# Patient Record
Sex: Female | Born: 1990 | Race: White | Hispanic: Yes | Marital: Single | Smoking: Never smoker
Health system: Southern US, Community
[De-identification: ages and names within clinical notes are randomized; demographics above are authoritative.]

## PROBLEM LIST (undated history)

## (undated) ENCOUNTER — Inpatient Hospital Stay (HOSPITAL_COMMUNITY): Payer: Self-pay

## (undated) DIAGNOSIS — D571 Sickle-cell disease without crisis: Secondary | ICD-10-CM

## (undated) HISTORY — PX: TUBAL LIGATION: SHX77

---
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
## 2014-11-02 ENCOUNTER — Emergency Department (HOSPITAL_COMMUNITY)
Admission: EM | Admit: 2014-11-02 | Discharge: 2014-11-02 | Disposition: A | Discharge status: Home / Self Care | Payer: Self-pay | Attending: Emergency Medicine | Admitting: Emergency Medicine

## 2014-11-02 ENCOUNTER — Encounter (HOSPITAL_COMMUNITY): Payer: Self-pay | Admitting: *Deleted

## 2014-11-02 DIAGNOSIS — R110 Nausea: Secondary | ICD-10-CM | POA: Insufficient documentation

## 2014-11-02 DIAGNOSIS — D5700 Hb-SS disease with crisis, unspecified: Secondary | ICD-10-CM | POA: Insufficient documentation

## 2014-11-02 DIAGNOSIS — Z79899 Other long term (current) drug therapy: Secondary | ICD-10-CM | POA: Insufficient documentation

## 2014-11-02 HISTORY — DX: Sickle-cell disease without crisis: D57.1

## 2014-11-02 LAB — COMPREHENSIVE METABOLIC PANEL
ALT: 23 U/L (ref 0–35)
AST: 48 U/L — ABNORMAL HIGH (ref 0–37)
Albumin: 4.4 g/dL (ref 3.5–5.2)
Alkaline Phosphatase: 83 U/L (ref 39–117)
Anion gap: 6 (ref 5–15)
CHLORIDE: 108 mmol/L (ref 96–112)
CO2: 24 mmol/L (ref 19–32)
Calcium: 9.3 mg/dL (ref 8.4–10.5)
Creatinine, Ser: 0.4 mg/dL — ABNORMAL LOW (ref 0.50–1.10)
GFR calc non Af Amer: >90 mL/min (ref >90)
Glucose, Bld: 94 mg/dL (ref 70–99)
POTASSIUM: 4 mmol/L (ref 3.5–5.1)
SODIUM: 138 mmol/L (ref 135–145)
Total Bilirubin: 3.3 mg/dL — ABNORMAL HIGH (ref 0.3–1.2)
Total Protein: 7.7 g/dL (ref 6.0–8.3)

## 2014-11-02 LAB — CBC WITH DIFFERENTIAL/PLATELET
BASOS ABS: 0 10*3/uL (ref 0.0–0.1)
BLASTS: 0 %
Band Neutrophils: 0 % (ref 0–10)
Basophils Relative: 0 % (ref 0–1)
Eosinophils Absolute: 0.2 10*3/uL (ref 0.0–0.7)
Eosinophils Relative: 2 % (ref 0–5)
HCT: 23.9 % — ABNORMAL LOW (ref 36.0–46.0)
HEMOGLOBIN: 8.6 g/dL — AB (ref 12.0–15.0)
LYMPHS ABS: 3.7 10*3/uL (ref 0.7–4.0)
LYMPHS PCT: 41 % (ref 12–46)
MCH: 36.8 pg — ABNORMAL HIGH (ref 26.0–34.0)
MCHC: 36 g/dL (ref 30.0–36.0)
MCV: 102.1 fL — AB (ref 78.0–100.0)
Metamyelocytes Relative: 0 %
Monocytes Absolute: 0.7 10*3/uL (ref 0.1–1.0)
Monocytes Relative: 8 % (ref 3–12)
Myelocytes: 0 %
NEUTROS PCT: 49 % (ref 43–77)
NRBC: 0 /100{WBCs}
Neutro Abs: 4.5 10*3/uL (ref 1.7–7.7)
PLATELETS: 492 10*3/uL — AB (ref 150–400)
PROMYELOCYTES ABS: 0 %
RBC: 2.34 MIL/uL — ABNORMAL LOW (ref 3.87–5.11)
RDW: 23.9 % — ABNORMAL HIGH (ref 11.5–15.5)
WBC: 9.1 10*3/uL (ref 4.0–10.5)

## 2014-11-02 LAB — RETICULOCYTES
RBC.: 2.34 MIL/uL — ABNORMAL LOW (ref 3.87–5.11)
RETIC COUNT ABSOLUTE: 428.2 10*3/uL — AB (ref 19.0–186.0)
RETIC CT PCT: 18.3 % — AB (ref 0.4–3.1)

## 2014-11-02 MED ORDER — ONDANSETRON HCL 4 MG/2ML IJ SOLN
4.0000 mg | Freq: Once | INTRAMUSCULAR | Status: AC
  Administered 2014-11-02: 4 mg via INTRAVENOUS
  Filled 2014-11-02: qty 2

## 2014-11-02 MED ORDER — HYDROMORPHONE HCL 1 MG/ML IJ SOLN
1.0000 mg | Freq: Once | INTRAMUSCULAR | Status: AC
  Administered 2014-11-02: 1 mg via INTRAVENOUS
  Filled 2014-11-02: qty 1

## 2014-11-02 MED ORDER — DIPHENHYDRAMINE HCL 50 MG/ML IJ SOLN
12.5000 mg | Freq: Once | INTRAMUSCULAR | Status: AC
  Administered 2014-11-02: 12.5 mg via INTRAVENOUS
  Filled 2014-11-02: qty 1

## 2014-11-02 MED ORDER — HYDROMORPHONE HCL 1 MG/ML IJ SOLN
2.0000 mg | Freq: Once | INTRAMUSCULAR | Status: AC
  Administered 2014-11-02: 2 mg via INTRAVENOUS
  Filled 2014-11-02: qty 2

## 2014-11-02 NOTE — ED Provider Notes (Signed)
CSN: [id number]     Arrival date & time 11/02/14  0146 History   First MD Initiated Contact with Patient 11/02/14 0703     Chief Complaint  Patient presents with  . Sickle Cell Pain Crisis     (Consider location/radiation/quality/duration/timing/severity/associated sxs/prior Treatment) Patient is a 24 y.o. female presenting with sickle cell pain. The history is provided by the patient.  Sickle Cell Pain Crisis Location:  Back and lower extremity Severity:  Severe Onset quality:  Gradual Duration:  2 days Similar to previous crisis episodes: yes   Timing:  Constant Progression:  Waxing and waning Chronicity:  Recurrent Sickle cell genotype:  [place] Usual hemoglobin level:  Unknown Date of last transfusion:  Jan 2016 Frequency of attacks:  Every 3 weeks but having to come to the hospital every 2-3 months History of pulmonary emboli: no   Context: not alcohol consumption, not change in medication, not dehydration, not non-compliance and not pregnancy   Relieved by:  Prescription drugs Worsened by:  Activity and movement Ineffective treatments:  None tried (oxycodone with help some but the pain keeps coming back) Associated symptoms: nausea   Associated symptoms: no chest pain, no congestion, no cough, no fever, no headaches, no leg ulcers, no shortness of breath, no swelling of legs and no vomiting   Risk factors: frequent pain crises     Past Medical History  Diagnosis Date  . Sickle cell anemia    History reviewed. No pertinent past surgical history. No family history on file. History  Substance Use Topics  . Smoking status: Never Smoker   . Smokeless tobacco: Not on file  . Alcohol Use: No   OB History    No data available     Review of Systems  Constitutional: Negative for fever.  HENT: Negative for congestion.   Respiratory: Negative for cough and shortness of breath.   Cardiovascular: Negative for chest pain.  Gastrointestinal: Positive for nausea. Negative for  vomiting.  Neurological: Negative for headaches.  All other systems reviewed and are negative.     Allergies  Ceftin  Home Medications   Prior to Admission medications   Medication Sig Start Date End Date Taking? Authorizing Provider  acetaminophen (TYLENOL) 500 MG tablet Take 1,000 mg by mouth every 6 (six) hours as needed for mild pain.   Yes Historical Provider, MD  hydroxyurea (HYDREA) 500 MG capsule Take 1,000 mg by mouth daily. May take with food to minimize GI side effects.   Yes Historical Provider, MD  ibuprofen (ADVIL,MOTRIN) 200 MG tablet Take 600 mg by mouth every 6 (six) hours as needed for moderate pain.   Yes Historical Provider, MD  oxyCODONE (OXY IR/ROXICODONE) 5 MG immediate release tablet Take 5 mg by mouth every 4 (four) hours as needed for severe pain.   Yes Historical Provider, MD   BP 120/65 mmHg  Pulse 107  Temp(Src) 98.3 F (36.8 C) (Oral)  Resp 19  Ht 5\' 2"  (1.575 m)  Wt 114 lb (51.71 kg)  BMI 20.85 kg/m2  SpO2 96%  LMP 10/09/2014 Physical Exam  Constitutional: She is oriented to person, place, and time. She appears well-developed and well-nourished. She appears distressed.  HENT:  Head: Normocephalic and atraumatic.  Eyes: EOM are normal. Pupils are equal, round, and reactive to light.  Cardiovascular: Normal rate, regular rhythm, normal heart sounds and intact distal pulses.  Exam reveals no friction rub.   No murmur heard. Pulmonary/Chest: Effort normal and breath sounds normal. She has no wheezes.  She has no rales.  Abdominal: Soft. Bowel sounds are normal. She exhibits no distension. There is no tenderness. There is no rebound and no guarding.  Musculoskeletal: Normal range of motion. She exhibits tenderness.       Lumbar back: She exhibits tenderness. She exhibits no bony tenderness and no deformity.       Back:       Left upper leg: She exhibits tenderness. She exhibits no bony tenderness, no swelling and no edema.       Legs: No edema    Neurological: She is alert and oriented to person, place, and time. No cranial nerve deficit.  Skin: Skin is warm and dry. No rash noted.  Psychiatric: She has a normal mood and affect. Her behavior is normal.  Nursing note and vitals reviewed.   ED Course  Procedures (including critical care time) Labs Review Labs Reviewed  CBC WITH DIFFERENTIAL/PLATELET - Abnormal; Notable for the following:    RBC 2.34 (*)    Hemoglobin 8.6 (*)    HCT 23.9 (*)    MCV 102.1 (*)    MCH 36.8 (*)    RDW 23.9 (*)    Platelets 492 (*)    All other components within normal limits  COMPREHENSIVE METABOLIC PANEL - Abnormal; Notable for the following:    BUN <5 (*)    Creatinine, Ser 0.40 (*)    AST 48 (*)    Total Bilirubin 3.3 (*)    All other components within normal limits  RETICULOCYTES - Abnormal; Notable for the following:    Retic Ct Pct 18.3 (*)    RBC. 2.34 (*)    Retic Count, Manual 428.2 (*)    All other components within normal limits    Imaging Review No results found.   EKG Interpretation None      MDM   Final diagnoses:  Sickle cell pain crisis    Patient with a history of sickle cell disease who presents today complaining she is in a pain crisis with pain in her bilateral lower back and left thigh. Patient's last crisis was in January where she was seen at [facility] and received a blood transfusion. She has been taking oxycodone for her pain crisis for the last 2 days with some improvement and then pain returns. She denies fever, chest pain, abdominal pain, vomiting, dysuria. Last menstrual period was the end of February.  Low suspicion for acute chest today. No infectious symptoms. Patient has tenderness with palpation of her back and left thigh but no joint swelling or signs of cellulitis.  Labs show hemoglobin of 8.6, CMP that is normal and elevated reticulocyte count. Will treat patient's pain with Dilaudid and reevaluate in 3045 minutes to see if she has any  improvement.  8:36 AM Plan to reexamine patient. Her pain had some improvement with the first dose of Dilaudid. Will do a second and reassess.  9:53 AM Pt states pain is now down to 3/10 from 7/10.  Will give 1 more round and feel pt will most likely be able to go home.  10:48 AM Pt feeling better will d/c home.  Blanchie Dessert, MD 11/02/14 1048

## 2014-11-02 NOTE — ED Notes (Signed)
The pt is c/o back and leg pains for 2 days.  She is taking oxycodone but it is not helping.  lmp feb 24

## 2014-11-02 NOTE — ED Notes (Signed)
The pt lives in [place] and usually gets her care at [facility].  No previous records here.  No porta-cath or a hickman cath

## 2014-11-02 NOTE — Discharge Instructions (Signed)
Sickle Cell Anemia, Adult °Sickle cell anemia is a condition in which red blood cells have an abnormal "sickle" shape. This abnormal shape shortens the cells' life span, which results in a lower than normal concentration of red blood cells in the blood. The sickle shape also causes the cells to clump together and block free blood flow through the blood vessels. As a result, the tissues and organs of the body do not receive enough oxygen. Sickle cell anemia causes organ damage and pain and increases the risk of infection. °CAUSES  °Sickle cell anemia is a genetic disorder. Those who receive two copies of the gene have the condition, and those who receive one copy have the trait. °RISK FACTORS °The sickle cell gene is most common in people whose families originated in [place]. Other areas of the globe where sickle cell trait occurs include the Mediterranean, [place] and [place], the [place], and the [place].  °SIGNS AND SYMPTOMS °· Pain, especially in the extremities, back, chest, or abdomen (common). The pain may start suddenly or may develop following an illness, especially if there is dehydration. Pain can also occur due to overexertion or exposure to extreme temperature changes. °· Frequent severe bacterial infections, especially certain types of pneumonia and meningitis. °· Pain and swelling in the hands and feet. °· Decreased activity.   °· Loss of appetite.   °· Change in behavior. °· Headaches. °· Seizures. °· Shortness of breath or difficulty breathing. °· Vision changes. °· Skin ulcers. °Those with the trait may not have symptoms or they may have mild symptoms.  °DIAGNOSIS  °Sickle cell anemia is diagnosed with blood tests that demonstrate the genetic trait. It is often diagnosed during the newborn period, due to mandatory testing nationwide. A variety of blood tests, X-rays, CT scans, MRI scans, ultrasounds, and lung function tests may also be done to monitor the condition. °TREATMENT  °Sickle  cell anemia may be treated with: °· Medicines. You may be given pain medicines, antibiotic medicines (to treat and prevent infections) or medicines to increase the production of certain types of hemoglobin. °· Fluids. °· Oxygen. °· Blood transfusions. °HOME CARE INSTRUCTIONS  °· Drink enough fluid to keep your urine clear or pale yellow. Increase your fluid intake in hot weather and during exercise. °· Do not smoke. Smoking lowers oxygen levels in the blood.   °· Only take over-the-counter or prescription medicines for pain, fever, or discomfort as directed by your health care provider. °· Take antibiotics as directed by your health care provider. Make sure you finish them it even if you start to feel better.   °· Take supplements as directed by your health care provider.   °· Consider wearing a medical alert bracelet. This tells anyone caring for you in an emergency of your condition.   °· When traveling, keep your medical information, health care provider's names, and the medicines you take with you at all times.   °· If you develop a fever, do not take medicines to reduce the fever right away. This could cover up a problem that is developing. Notify your health care provider. °· Keep all follow-up appointments with your health care provider. Sickle cell anemia requires regular medical care. °SEEK MEDICAL CARE IF: ° You have a fever. °SEEK IMMEDIATE MEDICAL CARE IF:  °· You feel dizzy or faint.   °· You have new abdominal pain, especially on the left side near the stomach area.   °· You develop a persistent, often uncomfortable and painful penile erection (priapism). If this is not treated immediately it   will lead to impotence.   °· You have numbness your arms or legs or you have a hard time moving them.   °· You have a hard time with speech.   °· You have a fever or persistent symptoms for more than 2-3 days.   °· You have a fever and your symptoms suddenly get worse.   °· You have signs or symptoms of infection.  These include:   °¨ Chills.   °¨ Abnormal tiredness (lethargy).   °¨ Irritability.   °¨ Poor eating.   °¨ Vomiting.   °· You develop pain that is not helped with medicine.   °· You develop shortness of breath. °· You have pain in your chest.   °· You are coughing up pus-like or bloody sputum.   °· You develop a stiff neck. °· Your feet or hands swell or have pain. °· Your abdomen appears bloated. °· You develop joint pain. °MAKE SURE YOU: °· Understand these instructions. °Document Released: 11/10/2005 Document Revised: 12/17/2013 Document Reviewed: 03/14/2013 °ExitCare® Patient Information ©2015 [employer]. This information is not intended to replace advice given to you by your health care provider. Make sure you discuss any questions you have with your health care provider. ° °

## 2015-01-07 ENCOUNTER — Encounter (HOSPITAL_COMMUNITY): Payer: Self-pay | Admitting: Emergency Medicine

## 2015-01-07 ENCOUNTER — Emergency Department (HOSPITAL_COMMUNITY)
Admission: EM | Admit: 2015-01-07 | Discharge: 2015-01-08 | Disposition: A | Discharge status: Home / Self Care | Payer: Self-pay | Attending: Emergency Medicine | Admitting: Emergency Medicine

## 2015-01-07 DIAGNOSIS — M545 Low back pain: Secondary | ICD-10-CM | POA: Insufficient documentation

## 2015-01-07 DIAGNOSIS — O9989 Other specified diseases and conditions complicating pregnancy, childbirth and the puerperium: Secondary | ICD-10-CM | POA: Insufficient documentation

## 2015-01-07 DIAGNOSIS — M79605 Pain in left leg: Secondary | ICD-10-CM | POA: Insufficient documentation

## 2015-01-07 DIAGNOSIS — M79602 Pain in left arm: Secondary | ICD-10-CM | POA: Insufficient documentation

## 2015-01-07 DIAGNOSIS — Z3490 Encounter for supervision of normal pregnancy, unspecified, unspecified trimester: Secondary | ICD-10-CM

## 2015-01-07 DIAGNOSIS — O99011 Anemia complicating pregnancy, first trimester: Secondary | ICD-10-CM | POA: Insufficient documentation

## 2015-01-07 DIAGNOSIS — R590 Localized enlarged lymph nodes: Secondary | ICD-10-CM | POA: Insufficient documentation

## 2015-01-07 DIAGNOSIS — Z79899 Other long term (current) drug therapy: Secondary | ICD-10-CM | POA: Insufficient documentation

## 2015-01-07 DIAGNOSIS — Z3A01 Less than 8 weeks gestation of pregnancy: Secondary | ICD-10-CM | POA: Insufficient documentation

## 2015-01-07 DIAGNOSIS — D5700 Hb-SS disease with crisis, unspecified: Secondary | ICD-10-CM | POA: Insufficient documentation

## 2015-01-07 LAB — CBC WITH DIFFERENTIAL/PLATELET
BASOS PCT: 1 % (ref 0–1)
Basophils Absolute: 0.1 10*3/uL (ref 0.0–0.1)
Eosinophils Absolute: 0 10*3/uL (ref 0.0–0.7)
Eosinophils Relative: 1 % (ref 0–5)
HEMATOCRIT: 26 % — AB (ref 36.0–46.0)
Hemoglobin: 9.3 g/dL — ABNORMAL LOW (ref 12.0–15.0)
LYMPHS ABS: 3.3 10*3/uL (ref 0.7–4.0)
Lymphocytes Relative: 45 % (ref 12–46)
MCH: 38.6 pg — ABNORMAL HIGH (ref 26.0–34.0)
MCHC: 35.8 g/dL (ref 30.0–36.0)
MCV: 107.9 fL — ABNORMAL HIGH (ref 78.0–100.0)
MONO ABS: 0.9 10*3/uL (ref 0.1–1.0)
Monocytes Relative: 12 % (ref 3–12)
Neutro Abs: 3.2 10*3/uL (ref 1.7–7.7)
Neutrophils Relative %: 43 % (ref 43–77)
Platelets: 464 10*3/uL — ABNORMAL HIGH (ref 150–400)
RBC: 2.41 MIL/uL — ABNORMAL LOW (ref 3.87–5.11)
RDW: 16.3 % — ABNORMAL HIGH (ref 11.5–15.5)
WBC: 7.5 10*3/uL (ref 4.0–10.5)

## 2015-01-07 LAB — RETICULOCYTES
RBC.: 2.41 MIL/uL — ABNORMAL LOW (ref 3.87–5.11)
Retic Count, Absolute: 173.5 10*3/uL (ref 19.0–186.0)
Retic Ct Pct: 7.2 % — ABNORMAL HIGH (ref 0.4–3.1)

## 2015-01-07 LAB — POC URINE PREG, ED: PREG TEST UR: POSITIVE — AB

## 2015-01-07 NOTE — ED Notes (Signed)
Pt. reports sickle cell pain crisis onset this afternoon with chest pain , legs pain and lower back pain . Denies fever or chills. Respirations unlabored.

## 2015-01-08 ENCOUNTER — Emergency Department (HOSPITAL_COMMUNITY): Payer: Self-pay

## 2015-01-08 LAB — I-STAT BETA HCG BLOOD, ED (MC, WL, AP ONLY)

## 2015-01-08 LAB — URINALYSIS, ROUTINE W REFLEX MICROSCOPIC
BILIRUBIN URINE: NEGATIVE
Glucose, UA: NEGATIVE mg/dL
Hgb urine dipstick: NEGATIVE
Ketones, ur: NEGATIVE mg/dL
LEUKOCYTES UA: NEGATIVE
Nitrite: NEGATIVE
Protein, ur: NEGATIVE mg/dL
Specific Gravity, Urine: 1.007 (ref 1.005–1.030)
Urobilinogen, UA: 0.2 mg/dL (ref 0.0–1.0)
pH: 7 (ref 5.0–8.0)

## 2015-01-08 LAB — COMPREHENSIVE METABOLIC PANEL
ALK PHOS: 67 U/L (ref 38–126)
ALT: 22 U/L (ref 14–54)
ANION GAP: 6 (ref 5–15)
AST: 38 U/L (ref 15–41)
Albumin: 4.5 g/dL (ref 3.5–5.0)
BUN: 8 mg/dL (ref 6–20)
CALCIUM: 9.6 mg/dL (ref 8.9–10.3)
CO2: 24 mmol/L (ref 22–32)
Chloride: 107 mmol/L (ref 101–111)
Creatinine, Ser: 0.39 mg/dL — ABNORMAL LOW (ref 0.44–1.00)
GFR calc Af Amer: >60 mL/min (ref >60)
GFR calc non Af Amer: >60 mL/min (ref >60)
GLUCOSE: 94 mg/dL (ref 65–99)
Potassium: 4.1 mmol/L (ref 3.5–5.1)
SODIUM: 137 mmol/L (ref 135–145)
Total Bilirubin: 3 mg/dL — ABNORMAL HIGH (ref 0.3–1.2)
Total Protein: 8.1 g/dL (ref 6.5–8.1)

## 2015-01-08 LAB — HCG, QUANTITATIVE, PREGNANCY: hCG, Beta Chain, Quant, S: 5893 m[IU]/mL — ABNORMAL HIGH (ref <5)

## 2015-01-08 LAB — ABO/RH: ABO/RH(D): O POS

## 2015-01-08 MED ORDER — HYDROMORPHONE HCL 1 MG/ML IJ SOLN
1.0000 mg | Freq: Once | INTRAMUSCULAR | Status: AC
  Administered 2015-01-08: 1 mg via INTRAVENOUS
  Filled 2015-01-08: qty 1

## 2015-01-08 MED ORDER — SODIUM CHLORIDE 0.9 % IV BOLUS (SEPSIS)
1000.0000 mL | Freq: Once | INTRAVENOUS | Status: AC
  Administered 2015-01-08: 1000 mL via INTRAVENOUS

## 2015-01-08 NOTE — ED Provider Notes (Signed)
CSN: [id number]     Arrival date & time 01/07/15  2304 History  This chart was scribed for Debby Freiberg, MD by Molli Posey, ED Scribe. This patient was seen in room B19C/B19C and the patient's care was started 12:27 AM.   Chief Complaint  Patient presents with  . Sickle Cell Pain Crisis   Patient is a 24 y.o. female presenting with sickle cell pain. The history is provided by the patient. No language interpreter was used.  Sickle Cell Pain Crisis Location:  Lower extremity and back Severity:  Moderate Duration:  7 hours Similar to previous crisis episodes: yes   Timing:  Constant Relieved by:  None tried Associated symptoms: fever   Associated symptoms: no congestion, no cough, no nausea, no sore throat and no vomiting    HPI Comments: Bernis Stecher is a 24 y.o. female who presents to the Emergency Department complaining of sickle cell pain that started yesterday at [facility]. She reports she has pain in her left leg, left arm and lower back. Pt states that she had a fever earlier with a temperature of 101 around 8PM (which is frequent with her sickle cell crises). Pt reports she has been experiencing some mild SOB but states she is not experiencing SOB now.  She states that she occasionally has fevers with sickle cell pain episodes. Pt reports that she normally takes oxycodone for her sickle cell pain but states she has not taken any due to her pregnancy. She states she has taken no medications PTA.  She denies nausea, vomiting, cough, congestion, leg swelling, sore throat or dysuria.   Pt reports she had vaginal discharge and spotting 2 days ago. She states that she took 2 pregnancy tests that both tested positive that day. She states that she has an appointment in 3 days.    Past Medical History  Diagnosis Date  . Sickle cell anemia    History reviewed. No pertinent past surgical history. No family history on file. History  Substance Use Topics  . Smoking status: Never Smoker   .  Smokeless tobacco: Not on file  . Alcohol Use: No   OB History    No data available     Review of Systems  Constitutional: Positive for fever.  HENT: Negative for congestion and sore throat.   Respiratory: Negative for cough.   Cardiovascular: Negative for leg swelling.  Gastrointestinal: Negative for nausea and vomiting.  Genitourinary: Negative for dysuria.  All other systems reviewed and are negative.  Allergies  Ceftin  Home Medications   Prior to Admission medications   Medication Sig Start Date End Date Taking? Authorizing Provider  acetaminophen (TYLENOL) 500 MG tablet Take 1,000 mg by mouth every 6 (six) hours as needed for mild pain.   Yes Historical Provider, MD  hydroxyurea (HYDREA) 500 MG capsule Take 1,000 mg by mouth daily. May take with food to minimize GI side effects.   Yes Historical Provider, MD  ibuprofen (ADVIL,MOTRIN) 200 MG tablet Take 600 mg by mouth every 6 (six) hours as needed for moderate pain.   Yes Historical Provider, MD   BP 90/38 mmHg  Pulse 85  Temp(Src) 98.5 F (36.9 C) (Oral)  Resp 26  SpO2 98%  LMP 12/04/2014 Physical Exam  Constitutional: She is oriented to person, place, and time. She appears well-developed and well-nourished.  HENT:  Head: Normocephalic and atraumatic.  Right Ear: External ear normal.  Left Ear: External ear normal.  Eyes: Conjunctivae and EOM are normal. Pupils are  equal, round, and reactive to light.  Neck: Normal range of motion. Neck supple.  Cardiovascular: Normal rate, regular rhythm, normal heart sounds and intact distal pulses.   Pulmonary/Chest: Effort normal and breath sounds normal.  Abdominal: Soft. Bowel sounds are normal. There is no tenderness.  Musculoskeletal: Normal range of motion.  Neurological: She is alert and oriented to person, place, and time.  Skin: Skin is warm and dry.  Vitals reviewed.   ED Course  Procedures   DIAGNOSTIC STUDIES: Oxygen Saturation is 99% on RA, normal by my  interpretation.    COORDINATION OF CARE: 7:31 AM Discussed treatment plan with pt at bedside and pt agreed to plan.   Labs Review Labs Reviewed  CBC WITH DIFFERENTIAL/PLATELET - Abnormal; Notable for the following:    RBC 2.41 (*)    Hemoglobin 9.3 (*)    HCT 26.0 (*)    MCV 107.9 (*)    MCH 38.6 (*)    RDW 16.3 (*)    Platelets 464 (*)    All other components within normal limits  COMPREHENSIVE METABOLIC PANEL - Abnormal; Notable for the following:    Creatinine, Ser 0.39 (*)    Total Bilirubin 3.0 (*)    All other components within normal limits  RETICULOCYTES - Abnormal; Notable for the following:    Retic Ct Pct 7.2 (*)    RBC. 2.41 (*)    All other components within normal limits  HCG, QUANTITATIVE, PREGNANCY - Abnormal; Notable for the following:    hCG, Beta Chain, Quant, S 5893 (*)    All other components within normal limits  POC URINE PREG, ED - Abnormal; Notable for the following:    Preg Test, Ur POSITIVE (*)    All other components within normal limits  I-STAT BETA HCG BLOOD, ED ([facility], [facility], [facility] ONLY) - Abnormal; Notable for the following:    I-stat hCG, quantitative >2000.0 (*)    All other components within normal limits  URINALYSIS, ROUTINE W REFLEX MICROSCOPIC  ABO/RH    Imaging Review US Ob Comp Less 14 Wks  01/08/2015   CLINICAL DATA:  Sickle cell pain crisis. Gestational age by last menstrual period 5 weeks and 0 days. Pelvic cramping.  EXAM: OBSTETRIC <14 WK [place] AND TRANSVAGINAL OB US  TECHNIQUE: Both transabdominal and transvaginal ultrasound examinations were performed for complete evaluation of the gestation as well as the maternal uterus, adnexal regions, and pelvic cul-de-sac. Transvaginal technique was performed to assess early pregnancy.  COMPARISON:  None.  FINDINGS: Intrauterine gestational sac: Visualized/normal in shape.  Yolk sac:  Present  Embryo:  Not present  Cardiac Activity: Not present  MSD: 7.1  mm   5 w   3  d  Maternal uterus/adnexae: No  subchorionic hemorrhage. Normal appearance of the adnexae, with 2.7 cm RIGHT corpus luteal cyst. No free fluid.  IMPRESSION: Early intrauterine gestational and yolk sac without fetal pole, or cardiac activity yet visualized. Recommend follow-up quantitative B-HCG levels and follow-up US in 14 days to confirm and assess viability. This recommendation follows SRU consensus guidelines: Diagnostic Criteria for Nonviable Pregnancy Early in the First Trimester. [place] Med 2013; 465:0354-65.   Electronically Signed   By: Elon Alas M.D.   On: 01/08/2015 06:48   US Ob Transvaginal  01/08/2015   CLINICAL DATA:  Sickle cell pain crisis. Gestational age by last menstrual period 5 weeks and 0 days. Pelvic cramping.  EXAM: OBSTETRIC <14 WK [place] AND TRANSVAGINAL OB US  TECHNIQUE: Both  transabdominal and transvaginal ultrasound examinations were performed for complete evaluation of the gestation as well as the maternal uterus, adnexal regions, and pelvic cul-de-sac. Transvaginal technique was performed to assess early pregnancy.  COMPARISON:  None.  FINDINGS: Intrauterine gestational sac: Visualized/normal in shape.  Yolk sac:  Present  Embryo:  Not present  Cardiac Activity: Not present  MSD: 7.1  mm   5 w   3  d  Maternal uterus/adnexae: No subchorionic hemorrhage. Normal appearance of the adnexae, with 2.7 cm RIGHT corpus luteal cyst. No free fluid.  IMPRESSION: Early intrauterine gestational and yolk sac without fetal pole, or cardiac activity yet visualized. Recommend follow-up quantitative B-HCG levels and follow-up US in 14 days to confirm and assess viability. This recommendation follows SRU consensus guidelines: Diagnostic Criteria for Nonviable Pregnancy Early in the First Trimester. [employer] Med 2013; 975:8832-54.   Electronically Signed   By: Elon Alas M.D.   On: 01/08/2015 06:48     EKG Interpretation None     EMERGENCY DEPARTMENT [place] PREGNANCY "Study: Limited Ultrasound of the Pelvis for  Pregnancy"  INDICATIONS:Pregnancy(required) and Vaginal bleeding Multiple views of the uterus and pelvic cavity were obtained in real-time with a multi-frequency probe.  APPROACH:Transabdominal   PERFORMED BY: Myself  IMAGES ARCHIVED?: Yes  LIMITATIONS: Emergent procedure  PREGNANCY FREE FLUID: None  ADNEXAL FINDINGS: none  PREGNANCY FINDINGS: Intrauterine fluid collection, no obvious yolk sac  INTERPRETATION: Indeterminate study  GESTATIONAL AGE, ESTIMATE:   FETAL HEART RATE:   CPT Codes:  98264-15 (transabdominal OB)  768-26-52 (transvaginal OB, Reduced level of service for incomplete exam)     MDM   Final diagnoses:  Intrauterine pregnancy  Sickle cell pain crisis    24 y.o. female with pertinent PMH of sickle cell presents with recurrent sickle cell pain, concern for pregnancy.  She denies chest pain or dyspnea.  Physical exam benign on arrival.  Bedside US demonstrated IU fluid collection without yolk sac.  HCG >5000, confirmatory formal US ordered.   [place] as above confirmed IUP.  O+.  Symptoms relieved with 1 round dilaudid.  DC home in stable condition.  I have reviewed all laboratory and imaging studies if ordered as above  1. Intrauterine pregnancy   2. Sickle cell crisis   3. Sickle cell pain crisis           Debby Freiberg, MD 01/08/15 [phone]

## 2015-01-08 NOTE — Discharge Instructions (Signed)
First Trimester of Pregnancy The first trimester of pregnancy is from week 1 until the end of week 12 (months 1 through 3). A week after a sperm fertilizes an egg, the egg will implant on the wall of the uterus. This embryo will begin to develop into a baby. Genes from you and your partner are forming the baby. The female genes determine whether the baby is a boy or a girl. At 6-8 weeks, the eyes and face are formed, and the heartbeat can be seen on ultrasound. At the end of 12 weeks, all the baby's organs are formed.  Now that you are pregnant, you will want to do everything you can to have a healthy baby. Two of the most important things are to get good prenatal care and to follow your health care provider's instructions. Prenatal care is all the medical care you receive before the baby's birth. This care will help prevent, find, and treat any problems during the pregnancy and childbirth. BODY CHANGES Your body goes through many changes during pregnancy. The changes vary from woman to woman.   You may gain or lose a couple of pounds at first.  You may feel sick to your stomach (nauseous) and throw up (vomit). If the vomiting is uncontrollable, call your health care provider.  You may tire easily.  You may develop headaches that can be relieved by medicines approved by your health care provider.  You may urinate more often. Painful urination may mean you have a bladder infection.  You may develop heartburn as a result of your pregnancy.  You may develop constipation because certain hormones are causing the muscles that push waste through your intestines to slow down.  You may develop hemorrhoids or swollen, bulging veins (varicose veins).  Your breasts may begin to grow larger and become tender. Your nipples may stick out more, and the tissue that surrounds them (areola) may become darker.  Your gums may bleed and may be sensitive to brushing and flossing.  Dark spots or blotches (chloasma,  mask of pregnancy) may develop on your face. This will likely fade after the baby is born.  Your menstrual periods will stop.  You may have a loss of appetite.  You may develop cravings for certain kinds of food.  You may have changes in your emotions from day to day, such as being excited to be pregnant or being concerned that something may go wrong with the pregnancy and baby.  You may have more vivid and strange dreams.  You may have changes in your hair. These can include thickening of your hair, rapid growth, and changes in texture. Some women also have hair loss during or after pregnancy, or hair that feels dry or thin. Your hair will most likely return to normal after your baby is born. WHAT TO EXPECT AT YOUR PRENATAL VISITS During a routine prenatal visit:  You will be weighed to make sure you and the baby are growing normally.  Your blood pressure will be taken.  Your abdomen will be measured to track your baby's growth.  The fetal heartbeat will be listened to starting around week 10 or 12 of your pregnancy.  Test results from any previous visits will be discussed. Your health care provider may ask you:  How you are feeling.  If you are feeling the baby move.  If you have had any abnormal symptoms, such as leaking fluid, bleeding, severe headaches, or abdominal cramping.  If you have any questions. Other tests  that may be performed during your first trimester include: °· Blood tests to find your blood type and to check for the presence of any previous infections. They will also be used to check for low iron levels (anemia) and Rh antibodies. Later in the pregnancy, blood tests for diabetes will be done along with other tests if problems develop. °· Urine tests to check for infections, diabetes, or protein in the urine. °· An ultrasound to confirm the proper growth and development of the baby. °· An amniocentesis to check for possible genetic problems. °· Fetal screens for  spina bifida and Down syndrome. °· You may need other tests to make sure you and the baby are doing well. °HOME CARE INSTRUCTIONS  °Medicines °· Follow your health care provider's instructions regarding medicine use. Specific medicines may be either safe or unsafe to take during pregnancy. °· Take your prenatal vitamins as directed. °· If you develop constipation, try taking a stool softener if your health care provider approves. °Diet °· Eat regular, well-balanced meals. Choose a variety of foods, such as meat or vegetable-based protein, fish, milk and low-fat dairy products, vegetables, fruits, and whole grain breads and cereals. Your health care provider will help you determine the amount of weight gain that is right for you. °· Avoid raw meat and uncooked cheese. These carry germs that can cause birth defects in the baby. °· Eating four or five small meals rather than three large meals a day may help relieve nausea and vomiting. If you start to feel nauseous, eating a few soda crackers can be helpful. Drinking liquids between meals instead of during meals also seems to help nausea and vomiting. °· If you develop constipation, eat more high-fiber foods, such as fresh vegetables or fruit and whole grains. Drink enough fluids to keep your urine clear or pale yellow. °Activity and Exercise °· Exercise only as directed by your health care provider. Exercising will help you: °¨ Control your weight. °¨ Stay in shape. °¨ Be prepared for labor and delivery. °· Experiencing pain or cramping in the lower abdomen or low back is a good sign that you should stop exercising. Check with your health care provider before continuing normal exercises. °· Try to avoid standing for long periods of time. Move your legs often if you must stand in one place for a long time. °· Avoid heavy lifting. °· Wear low-heeled shoes, and practice good posture. °· You may continue to have sex unless your health care provider directs you  otherwise. °Relief of Pain or Discomfort °· Wear a good support bra for breast tenderness.   °· Take warm sitz baths to soothe any pain or discomfort caused by hemorrhoids. Use hemorrhoid cream if your health care provider approves.   °· Rest with your legs elevated if you have leg cramps or low back pain. °· If you develop varicose veins in your legs, wear support hose. Elevate your feet for 15 minutes, 3-4 times a day. Limit salt in your diet. °Prenatal Care °· Schedule your prenatal visits by the twelfth week of pregnancy. They are usually scheduled monthly at first, then more often in the last 2 months before delivery. °· Write down your questions. Take them to your prenatal visits. °· Keep all your prenatal visits as directed by your health care provider. °Safety °· Wear your seat belt at all times when driving. °· Make a list of emergency phone numbers, including numbers for family, friends, the hospital, and police and fire departments. °General Tips °·   Ask your health care provider for a referral to a local prenatal education class. Begin classes no later than at the beginning of month 6 of your pregnancy.  Ask for help if you have counseling or nutritional needs during pregnancy. Your health care provider can offer advice or refer you to specialists for help with various needs.  Do not use hot tubs, steam rooms, or saunas.  Do not douche or use tampons or scented sanitary pads.  Do not cross your legs for long periods of time.  Avoid cat litter boxes and soil used by cats. These carry germs that can cause birth defects in the baby and possibly loss of the fetus by miscarriage or stillbirth.  Avoid all smoking, herbs, alcohol, and medicines not prescribed by your health care provider. Chemicals in these affect the formation and growth of the baby.  Schedule a dentist appointment. At home, brush your teeth with a soft toothbrush and be gentle when you floss. SEEK MEDICAL CARE IF:   You have  dizziness.  You have mild pelvic cramps, pelvic pressure, or nagging pain in the abdominal area.  You have persistent nausea, vomiting, or diarrhea.  You have a bad smelling vaginal discharge.  You have pain with urination.  You notice increased swelling in your face, hands, legs, or ankles. SEEK IMMEDIATE MEDICAL CARE IF:   You have a fever.  You are leaking fluid from your vagina.  You have spotting or bleeding from your vagina.  You have severe abdominal cramping or pain.  You have rapid weight gain or loss.  You vomit blood or material that looks like coffee grounds.  You are exposed to [place] measles and have never had them.  You are exposed to fifth disease or chickenpox.  You develop a severe headache.  You have shortness of breath.  You have any kind of trauma, such as from a fall or a car accident. Document Released: 07/27/2001 Document Revised: 12/17/2013 Document Reviewed: 06/12/2013 [facility] Patient Information 2015 [place], [place]. This information is not intended to replace advice given to you by your health care provider. Make sure you discuss any questions you have with your health care provider. Sickle Cell Anemia, Adult Sickle cell anemia is a condition in which red blood cells have an abnormal "sickle" shape. This abnormal shape shortens the cells' life span, which results in a lower than normal concentration of red blood cells in the blood. The sickle shape also causes the cells to clump together and block free blood flow through the blood vessels. As a result, the tissues and organs of the body do not receive enough oxygen. Sickle cell anemia causes organ damage and pain and increases the risk of infection. CAUSES  Sickle cell anemia is a genetic disorder. Those who receive two copies of the gene have the condition, and those who receive one copy have the trait. RISK FACTORS The sickle cell gene is most common in people whose families originated in  [place]. Other areas of the globe where sickle cell trait occurs include the [place], [place], and the [place].  SIGNS AND SYMPTOMS  Pain, especially in the extremities, back, chest, or abdomen (common). The pain may start suddenly or may develop following an illness, especially if there is dehydration. Pain can also occur due to overexertion or exposure to extreme temperature changes.  Frequent severe bacterial infections, especially certain types of pneumonia and meningitis.  Pain and swelling in the hands and feet.  Decreased activity.   Loss of appetite.   Change in behavior.  Headaches.  Seizures.  Shortness of breath or difficulty breathing.  Vision changes.  Skin ulcers. Those with the trait may not have symptoms or they may have mild symptoms.  DIAGNOSIS  Sickle cell anemia is diagnosed with blood tests that demonstrate the genetic trait. It is often diagnosed during the newborn period, due to mandatory testing nationwide. A variety of blood tests, X-rays, CT scans, MRI scans, ultrasounds, and lung function tests may also be done to monitor the condition. TREATMENT  Sickle cell anemia may be treated with:  Medicines. You may be given pain medicines, antibiotic medicines (to treat and prevent infections) or medicines to increase the production of certain types of hemoglobin.  Fluids.  Oxygen.  Blood transfusions. HOME CARE INSTRUCTIONS   Drink enough fluid to keep your urine clear or pale yellow. Increase your fluid intake in hot weather and during exercise.  Do not smoke. Smoking lowers oxygen levels in the blood.   Only take over-the-counter or prescription medicines for pain, fever, or discomfort as directed by your health care provider.  Take antibiotics as directed by your health care provider. Make sure you finish them it even if you start to feel better.   Take supplements as directed by your health care  provider.   Consider wearing a medical alert bracelet. This tells anyone caring for you in an emergency of your condition.   When traveling, keep your medical information, health care provider's names, and the medicines you take with you at all times.   If you develop a fever, do not take medicines to reduce the fever right away. This could cover up a problem that is developing. Notify your health care provider.  Keep all follow-up appointments with your health care provider. Sickle cell anemia requires regular medical care. SEEK MEDICAL CARE IF: You have a fever. SEEK IMMEDIATE MEDICAL CARE IF:   You feel dizzy or faint.   You have new abdominal pain, especially on the left side near the stomach area.   You develop a persistent, often uncomfortable and painful penile erection (priapism). If this is not treated immediately it will lead to impotence.   You have numbness your arms or legs or you have a hard time moving them.   You have a hard time with speech.   You have a fever or persistent symptoms for more than 2-3 days.   You have a fever and your symptoms suddenly get worse.   You have signs or symptoms of infection. These include:   Chills.   Abnormal tiredness (lethargy).   Irritability.   Poor eating.   Vomiting.   You develop pain that is not helped with medicine.   You develop shortness of breath.  You have pain in your chest.   You are coughing up pus-like or bloody sputum.   You develop a stiff neck.  Your feet or hands swell or have pain.  Your abdomen appears bloated.  You develop joint pain. MAKE SURE YOU:  Understand these instructions. Document Released: 11/10/2005 Document Revised: 12/17/2013 Document Reviewed: 03/14/2013 [facility] Patient Information 2015 [place], [place]. This information is not intended to replace advice given to you by your health care provider. Make sure you discuss any questions you have with your  health care provider.

## 2015-01-08 NOTE — ED Notes (Signed)
Home address: [address] [place], [place].

## 2015-01-08 NOTE — ED Notes (Signed)
Patient allowed to eat per Dr. Colin Rhein. Relayed to patient. Currently awaiting hcg result.

## 2015-01-08 NOTE — ED Notes (Signed)
Attempted to call ultrasound called, no response.

## 2015-01-08 NOTE — ED Notes (Signed)
2 missed iv attempts by this RN. 2nd RN at the bedside for access.

## 2015-01-08 NOTE — ED Notes (Signed)
Spoke to A/C, Crystal, in regards to transportation home. Discussed with patient need to call a friend for a ride if possible, she acknowledges. No one to call with a car. Crystal approves taxi voucher prior to discharge. Patient is currently in ultrasound.

## 2015-02-04 ENCOUNTER — Encounter: Payer: Self-pay | Admitting: Obstetrics & Gynecology

## 2015-02-04 ENCOUNTER — Ambulatory Visit (INDEPENDENT_AMBULATORY_CARE_PROVIDER_SITE_OTHER): Payer: Self-pay | Admitting: Obstetrics & Gynecology

## 2015-02-04 VITALS — BP 123/75 | HR 97 | Temp 98.9°F | Wt 113.8 lb

## 2015-02-04 DIAGNOSIS — D571 Sickle-cell disease without crisis: Secondary | ICD-10-CM

## 2015-02-04 DIAGNOSIS — Z3491 Encounter for supervision of normal pregnancy, unspecified, first trimester: Secondary | ICD-10-CM

## 2015-02-04 DIAGNOSIS — O0990 Supervision of high risk pregnancy, unspecified, unspecified trimester: Secondary | ICD-10-CM

## 2015-02-04 DIAGNOSIS — O99019 Anemia complicating pregnancy, unspecified trimester: Secondary | ICD-10-CM

## 2015-02-04 LAB — POCT URINALYSIS DIP (DEVICE)
Glucose, UA: NEGATIVE mg/dL
KETONES UR: NEGATIVE mg/dL
LEUKOCYTES UA: NEGATIVE
Nitrite: NEGATIVE
Protein, ur: NEGATIVE mg/dL
Specific Gravity, Urine: 1.015 (ref 1.005–1.030)
UROBILINOGEN UA: 0.2 mg/dL (ref 0.0–1.0)
pH: 6 (ref 5.0–8.0)

## 2015-02-04 NOTE — Progress Notes (Signed)
  Subjective:    Gabriella Griffin is being seen today for her first obstetrical visit.  This is not a planned pregnancy. She is at 105w2d gestation. Her obstetrical history is significant for Sickle cell anemia. Relationship with FOB: significant other, living together. Patient does intend to breast feed. Pregnancy history fully reviewed. Followed at [facility] Dr. Clenton Pare. [place] in [place]. Had recent sickle cell crises.  Has a crises q 2-3 months.  Pt reports being hospitalized ~4-5 times last year fro crises.  Crises is usually in left leg and back.   Patient reports no complaints.  Review of Systems:   Review of Systems  Objective:     BP 123/75 mmHg  Pulse 97  Temp(Src) 98.9 F (37.2 C)  Wt 113 lb 12.8 oz (51.619 kg)  LMP 12/04/2014 Physical Exam  Exam  Pt in NAD HEENT: WNL Lungs: CTA CV: RRR Abd; soft, NT, ND GU: EGBUS: no lesions Vagina: no blood in vault Cervix: no lesion; no mucopurulent d/c Uterus: ~10 weeks sized Adnexa: no masses; non tender  Ext: no edema; FROM       Assessment:    Pregnancy: G1P0 Sickle cell anemia in pregnancy      01/09/2015 CLINICAL DATA: Sickle cell pain crisis. Gestational age by last menstrual period 5 weeks and 0 days. Pelvic cramping.  EXAM: OBSTETRIC <14 WK [place] AND TRANSVAGINAL OB US  TECHNIQUE: Both transabdominal and transvaginal ultrasound examinations were performed for complete evaluation of the gestation as well as the maternal uterus, adnexal regions, and pelvic cul-de-sac. Transvaginal technique was performed to assess early pregnancy.  COMPARISON: None.  FINDINGS: Intrauterine gestational sac: Visualized/normal in shape.  Yolk sac: Present  Embryo: Not present  Cardiac Activity: Not present  MSD: 7.1 mm 5 w 3 d  Maternal uterus/adnexae: No subchorionic hemorrhage. Normal appearance of the adnexae, with 2.7 cm RIGHT corpus luteal cyst. No free fluid.  IMPRESSION: Early  intrauterine gestational and yolk sac without fetal pole, or cardiac activity yet visualized. Recommend follow-up quantitative B-HCG levels and follow-up US in 14 days to confirm and assess viability. This recommendation follows SRU consensus guidelines: Diagnostic Criteria for Nonviable Pregnancy Early in the First Trimester. [employer] Med 2013; 388:8280-03. Plan:     Initial labs drawn. Prenatal vitamins. Problem list reviewed and updated. AFP3 discussed: requested. 1st trimester genetic screen requested Role of ultrasound in pregnancy discussed; fetal survey: requested. Amniocentesis discussed: not indicated. Follow up in 4 weeks. 60% of 40 min visit spent on counseling and coordination of care.   Partner needs screening for sickle cell anemia.  He was referred for testing.  Appt made for this thur, June 23.     HARRAWAY-SMITH, Orville Mena 02/04/2015

## 2015-02-04 NOTE — Progress Notes (Signed)
Scheduled FOB at [facility], [address] [phone] for Thursday, June 23rd @ [phone]

## 2015-02-04 NOTE — Patient Instructions (Signed)
First Trimester of Pregnancy The first trimester of pregnancy is from week 1 until the end of week 12 (months 1 through 3). A week after a sperm fertilizes an egg, the egg will implant on the wall of the uterus. This embryo will begin to develop into a baby. Genes from you and your partner are forming the baby. The female genes determine whether the baby is a boy or a girl. At 6-8 weeks, the eyes and face are formed, and the heartbeat can be seen on ultrasound. At the end of 12 weeks, all the baby's organs are formed.  Now that you are pregnant, you will want to do everything you can to have a healthy baby. Two of the most important things are to get good prenatal care and to follow your health care provider's instructions. Prenatal care is all the medical care you receive before the baby's birth. This care will help prevent, find, and treat any problems during the pregnancy and childbirth. BODY CHANGES Your body goes through many changes during pregnancy. The changes vary from woman to woman.   You may gain or lose a couple of pounds at first.  You may feel sick to your stomach (nauseous) and throw up (vomit). If the vomiting is uncontrollable, call your health care provider.  You may tire easily.  You may develop headaches that can be relieved by medicines approved by your health care provider.  You may urinate more often. Painful urination may mean you have a bladder infection.  You may develop heartburn as a result of your pregnancy.  You may develop constipation because certain hormones are causing the muscles that push waste through your intestines to slow down.  You may develop hemorrhoids or swollen, bulging veins (varicose veins).  Your breasts may begin to grow larger and become tender. Your nipples may stick out more, and the tissue that surrounds them (areola) may become darker.  Your gums may bleed and may be sensitive to brushing and flossing.  Dark spots or blotches (chloasma,  mask of pregnancy) may develop on your face. This will likely fade after the baby is born.  Your menstrual periods will stop.  You may have a loss of appetite.  You may develop cravings for certain kinds of food.  You may have changes in your emotions from day to day, such as being excited to be pregnant or being concerned that something may go wrong with the pregnancy and baby.  You may have more vivid and strange dreams.  You may have changes in your hair. These can include thickening of your hair, rapid growth, and changes in texture. Some women also have hair loss during or after pregnancy, or hair that feels dry or thin. Your hair will most likely return to normal after your baby is born. WHAT TO EXPECT AT YOUR PRENATAL VISITS During a routine prenatal visit:  You will be weighed to make sure you and the baby are growing normally.  Your blood pressure will be taken.  Your abdomen will be measured to track your baby's growth.  The fetal heartbeat will be listened to starting around week 10 or 12 of your pregnancy.  Test results from any previous visits will be discussed. Your health care provider may ask you:  How you are feeling.  If you are feeling the baby move.  If you have had any abnormal symptoms, such as leaking fluid, bleeding, severe headaches, or abdominal cramping.  If you have any questions. Other tests   that may be performed during your first trimester include:  Blood tests to find your blood type and to check for the presence of any previous infections. They will also be used to check for low iron levels (anemia) and Rh antibodies. Later in the pregnancy, blood tests for diabetes will be done along with other tests if problems develop.  Urine tests to check for infections, diabetes, or protein in the urine.  An ultrasound to confirm the proper growth and development of the baby.  An amniocentesis to check for possible genetic problems.  Fetal screens for  spina bifida and Down syndrome.  You may need other tests to make sure you and the baby are doing well. HOME CARE INSTRUCTIONS  Medicines  Follow your health care provider's instructions regarding medicine use. Specific medicines may be either safe or unsafe to take during pregnancy.  Take your prenatal vitamins as directed.  If you develop constipation, try taking a stool softener if your health care provider approves. Diet  Eat regular, well-balanced meals. Choose a variety of foods, such as meat or vegetable-based protein, fish, milk and low-fat dairy products, vegetables, fruits, and whole grain breads and cereals. Your health care provider will help you determine the amount of weight gain that is right for you.  Avoid raw meat and uncooked cheese. These carry germs that can cause birth defects in the baby.  Eating four or five small meals rather than three large meals a day may help relieve nausea and vomiting. If you start to feel nauseous, eating a few soda crackers can be helpful. Drinking liquids between meals instead of during meals also seems to help nausea and vomiting.  If you develop constipation, eat more high-fiber foods, such as fresh vegetables or fruit and whole grains. Drink enough fluids to keep your urine clear or pale yellow. Activity and Exercise  Exercise only as directed by your health care provider. Exercising will help you:  Control your weight.  Stay in shape.  Be prepared for labor and delivery.  Experiencing pain or cramping in the lower abdomen or low back is a good sign that you should stop exercising. Check with your health care provider before continuing normal exercises.  Try to avoid standing for long periods of time. Move your legs often if you must stand in one place for a long time.  Avoid heavy lifting.  Wear low-heeled shoes, and practice good posture.  You may continue to have sex unless your health care provider directs you  otherwise. Relief of Pain or Discomfort  Wear a good support bra for breast tenderness.   Take warm sitz baths to soothe any pain or discomfort caused by hemorrhoids. Use hemorrhoid cream if your health care provider approves.   Rest with your legs elevated if you have leg cramps or low back pain.  If you develop varicose veins in your legs, wear support hose. Elevate your feet for 15 minutes, 3-4 times a day. Limit salt in your diet. Prenatal Care  Schedule your prenatal visits by the twelfth week of pregnancy. They are usually scheduled monthly at first, then more often in the last 2 months before delivery.  Write down your questions. Take them to your prenatal visits.  Keep all your prenatal visits as directed by your health care provider. Safety  Wear your seat belt at all times when driving.  Make a list of emergency phone numbers, including numbers for family, friends, the hospital, and police and fire departments. General Tips    Ask your health care provider for a referral to a local prenatal education class. Begin classes no later than at the beginning of month 6 of your pregnancy.  Ask for help if you have counseling or nutritional needs during pregnancy. Your health care provider can offer advice or refer you to specialists for help with various needs.  Do not use hot tubs, steam rooms, or saunas.  Do not douche or use tampons or scented sanitary pads.  Do not cross your legs for long periods of time.  Avoid cat litter boxes and soil used by cats. These carry germs that can cause birth defects in the baby and possibly loss of the fetus by miscarriage or stillbirth.  Avoid all smoking, herbs, alcohol, and medicines not prescribed by your health care provider. Chemicals in these affect the formation and growth of the baby.  Schedule a dentist appointment. At home, brush your teeth with a soft toothbrush and be gentle when you floss. SEEK MEDICAL CARE IF:   You have  dizziness.  You have mild pelvic cramps, pelvic pressure, or nagging pain in the abdominal area.  You have persistent nausea, vomiting, or diarrhea.  You have a bad smelling vaginal discharge.  You have pain with urination.  You notice increased swelling in your face, hands, legs, or ankles. SEEK IMMEDIATE MEDICAL CARE IF:   You have a fever.  You are leaking fluid from your vagina.  You have spotting or bleeding from your vagina.  You have severe abdominal cramping or pain.  You have rapid weight gain or loss.  You vomit blood or material that looks like coffee grounds.  You are exposed to German measles and have never had them.  You are exposed to fifth disease or chickenpox.  You develop a severe headache.  You have shortness of breath.  You have any kind of trauma, such as from a fall or a car accident. Document Released: 07/27/2001 Document Revised: 12/17/2013 Document Reviewed: 06/12/2013 Exit[employer] Patient Information 2015 [employer]. This information is not intended to replace advice given to you by your health care provider. Make sure you discuss any questions you have with your health care provider.  

## 2015-02-04 NOTE — Progress Notes (Signed)
Initial OB appointment New OB Educational Packet New OB labs Hgb: Trace, Bilirubin: Small Breastfeeding tip of the week reviewed

## 2015-02-05 LAB — PRESCRIPTION MONITORING PROFILE (19 PANEL)
Amphetamine/Meth: NEGATIVE ng/mL
Barbiturate Screen, Urine: NEGATIVE ng/mL
Benzodiazepine Screen, Urine: NEGATIVE ng/mL
Buprenorphine, Urine: NEGATIVE ng/mL
CANNABINOID SCRN UR: NEGATIVE ng/mL
COCAINE METABOLITES: NEGATIVE ng/mL
Carisoprodol, Urine: NEGATIVE ng/mL
Creatinine, Urine: 97.32 mg/dL (ref >20.0)
FENTANYL URINE: NEGATIVE ng/mL
MDMA URINE: NEGATIVE ng/mL
MEPERIDINE UR: NEGATIVE ng/mL
METHAQUALONE SCREEN (URINE): NEGATIVE ng/mL
Methadone Screen, Urine: NEGATIVE ng/mL
NITRITES URINE, INITIAL: NEGATIVE ug/mL
OPIATE SCREEN, URINE: NEGATIVE ng/mL
Oxycodone Screen, Ur: NEGATIVE ng/mL
Phencyclidine, Ur: NEGATIVE ng/mL
Propoxyphene: NEGATIVE ng/mL
TRAMADOL UR: NEGATIVE ng/mL
Tapentadol, urine: NEGATIVE ng/mL
ZOLPIDEM, URINE: NEGATIVE ng/mL
pH, Initial: 6 pH (ref 4.5–8.9)

## 2015-02-05 LAB — PRENATAL PROFILE (SOLSTAS)
Antibody Screen: NEGATIVE
BASOS ABS: 0 10*3/uL (ref 0.0–0.1)
Basophils Relative: 0 % (ref 0–1)
Eosinophils Absolute: 0 10*3/uL (ref 0.0–0.7)
Eosinophils Relative: 0 % (ref 0–5)
HCT: 25.9 % — ABNORMAL LOW (ref 36.0–46.0)
HIV: NONREACTIVE
Hemoglobin: 9 g/dL — ABNORMAL LOW (ref 12.0–15.0)
Hepatitis B Surface Ag: NEGATIVE
LYMPHS PCT: 15 % (ref 12–46)
Lymphs Abs: 2 10*3/uL (ref 0.7–4.0)
MCH: 37.5 pg — ABNORMAL HIGH (ref 26.0–34.0)
MCHC: 34.7 g/dL (ref 30.0–36.0)
MCV: 107.9 fL — ABNORMAL HIGH (ref 78.0–100.0)
MPV: 10.3 fL (ref 8.6–12.4)
Monocytes Absolute: 1.2 10*3/uL — ABNORMAL HIGH (ref 0.1–1.0)
Monocytes Relative: 9 % (ref 3–12)
NEUTROS ABS: 9.9 10*3/uL — AB (ref 1.7–7.7)
Neutrophils Relative %: 76 % (ref 43–77)
PLATELETS: 483 10*3/uL — AB (ref 150–400)
RBC: 2.4 MIL/uL — AB (ref 3.87–5.11)
RDW: 16.1 % — AB (ref 11.5–15.5)
Rh Type: POSITIVE
Rubella: 2.56 Index — ABNORMAL HIGH (ref <0.90)
WBC: 13 10*3/uL — ABNORMAL HIGH (ref 4.0–10.5)

## 2015-02-05 LAB — CYTOLOGY - PAP

## 2015-02-05 NOTE — Progress Notes (Signed)
1st trimester genetic screen ordered at [facility]

## 2015-02-06 ENCOUNTER — Encounter (HOSPITAL_COMMUNITY): Payer: Self-pay | Admitting: Emergency Medicine

## 2015-02-06 ENCOUNTER — Inpatient Hospital Stay (HOSPITAL_COMMUNITY)
Admission: EM | Admit: 2015-02-06 | Discharge: 2015-02-09 | DRG: 781 | Disposition: A | Discharge status: Home / Self Care | Payer: Medicaid Other | Attending: Internal Medicine | Admitting: Internal Medicine

## 2015-02-06 ENCOUNTER — Emergency Department (HOSPITAL_COMMUNITY): Payer: Medicaid Other

## 2015-02-06 DIAGNOSIS — O99011 Anemia complicating pregnancy, first trimester: Principal | ICD-10-CM | POA: Diagnosis present

## 2015-02-06 DIAGNOSIS — Z833 Family history of diabetes mellitus: Secondary | ICD-10-CM

## 2015-02-06 DIAGNOSIS — D571 Sickle-cell disease without crisis: Secondary | ICD-10-CM | POA: Diagnosis present

## 2015-02-06 DIAGNOSIS — D5700 Hb-SS disease with crisis, unspecified: Secondary | ICD-10-CM | POA: Diagnosis present

## 2015-02-06 DIAGNOSIS — Z3A09 9 weeks gestation of pregnancy: Secondary | ICD-10-CM | POA: Diagnosis present

## 2015-02-06 DIAGNOSIS — Z832 Family history of diseases of the blood and blood-forming organs and certain disorders involving the immune mechanism: Secondary | ICD-10-CM

## 2015-02-06 DIAGNOSIS — J209 Acute bronchitis, unspecified: Secondary | ICD-10-CM | POA: Diagnosis present

## 2015-02-06 DIAGNOSIS — J029 Acute pharyngitis, unspecified: Secondary | ICD-10-CM | POA: Diagnosis present

## 2015-02-06 LAB — CBC WITH DIFFERENTIAL/PLATELET
Basophils Absolute: 0.1 10*3/uL (ref 0.0–0.1)
Basophils Relative: 0 % (ref 0–1)
EOS PCT: 1 % (ref 0–5)
Eosinophils Absolute: 0.1 10*3/uL (ref 0.0–0.7)
HCT: 26.2 % — ABNORMAL LOW (ref 36.0–46.0)
Hemoglobin: 9.7 g/dL — ABNORMAL LOW (ref 12.0–15.0)
LYMPHS ABS: 2.3 10*3/uL (ref 0.7–4.0)
LYMPHS PCT: 20 % (ref 12–46)
MCH: 38.3 pg — ABNORMAL HIGH (ref 26.0–34.0)
MCHC: 37 g/dL — ABNORMAL HIGH (ref 30.0–36.0)
MCV: 103.6 fL — AB (ref 78.0–100.0)
MONO ABS: 1.1 10*3/uL — AB (ref 0.1–1.0)
Monocytes Relative: 9 % (ref 3–12)
Neutro Abs: 8.3 10*3/uL — ABNORMAL HIGH (ref 1.7–7.7)
Neutrophils Relative %: 70 % (ref 43–77)
PLATELETS: 481 10*3/uL — AB (ref 150–400)
RBC: 2.53 MIL/uL — ABNORMAL LOW (ref 3.87–5.11)
RDW: 15 % (ref 11.5–15.5)
WBC: 11.8 10*3/uL — AB (ref 4.0–10.5)

## 2015-02-06 LAB — COMPREHENSIVE METABOLIC PANEL
ALBUMIN: 3.9 g/dL (ref 3.5–5.0)
ALK PHOS: 63 U/L (ref 38–126)
ALT: 22 U/L (ref 14–54)
AST: 45 U/L — ABNORMAL HIGH (ref 15–41)
Anion gap: 7 (ref 5–15)
BUN: <5 mg/dL — ABNORMAL LOW (ref 6–20)
CO2: 23 mmol/L (ref 22–32)
Calcium: 9.2 mg/dL (ref 8.9–10.3)
Chloride: 107 mmol/L (ref 101–111)
Creatinine, Ser: 0.34 mg/dL — ABNORMAL LOW (ref 0.44–1.00)
GFR calc Af Amer: >60 mL/min (ref >60)
GFR calc non Af Amer: >60 mL/min (ref >60)
Glucose, Bld: 103 mg/dL — ABNORMAL HIGH (ref 65–99)
POTASSIUM: 3.7 mmol/L (ref 3.5–5.1)
SODIUM: 137 mmol/L (ref 135–145)
TOTAL PROTEIN: 7.6 g/dL (ref 6.5–8.1)
Total Bilirubin: 2.3 mg/dL — ABNORMAL HIGH (ref 0.3–1.2)

## 2015-02-06 LAB — HEMOGLOBINOPATHY EVALUATION
HEMOGLOBIN OTHER: 0 %
HGB A2 QUANT: 2.3 % (ref 2.2–3.2)
Hgb A: 0 % — ABNORMAL LOW (ref 96.8–97.8)
Hgb F Quant: 27.8 % — ABNORMAL HIGH (ref 0.0–2.0)
Hgb S Quant: 69.9 % — ABNORMAL HIGH

## 2015-02-06 LAB — CULTURE, OB URINE

## 2015-02-06 LAB — RETICULOCYTES
RBC.: 2.53 MIL/uL — AB (ref 3.87–5.11)
RETIC COUNT ABSOLUTE: 301.1 10*3/uL — AB (ref 19.0–186.0)
RETIC CT PCT: 11.9 % — AB (ref 0.4–3.1)

## 2015-02-06 LAB — PROTIME-INR
INR: 1.17 (ref 0.00–1.49)
PROTHROMBIN TIME: 15.1 s (ref 11.6–15.2)

## 2015-02-06 MED ORDER — SODIUM CHLORIDE 0.9 % IV SOLN
1000.0000 mL | INTRAVENOUS | Status: DC
  Administered 2015-02-06 – 2015-02-08 (×7): 1000 mL via INTRAVENOUS

## 2015-02-06 MED ORDER — SODIUM CHLORIDE 0.9 % IV SOLN
1000.0000 mL | Freq: Once | INTRAVENOUS | Status: AC
  Administered 2015-02-06: 1000 mL via INTRAVENOUS

## 2015-02-06 MED ORDER — ONDANSETRON HCL 4 MG/2ML IJ SOLN
4.0000 mg | INTRAMUSCULAR | Status: DC | PRN
  Administered 2015-02-06: 4 mg via INTRAVENOUS
  Filled 2015-02-06: qty 2

## 2015-02-06 MED ORDER — HYDROMORPHONE HCL 1 MG/ML IJ SOLN
1.0000 mg | Freq: Once | INTRAMUSCULAR | Status: AC
  Administered 2015-02-06: 1 mg via INTRAVENOUS
  Filled 2015-02-06: qty 1

## 2015-02-06 MED ORDER — HYDROMORPHONE HCL 1 MG/ML IJ SOLN
1.0000 mg | INTRAMUSCULAR | Status: AC | PRN
Start: 2015-02-06 — End: 2015-02-06
  Administered 2015-02-06 (×2): 1 mg via INTRAVENOUS
  Filled 2015-02-06 (×2): qty 1

## 2015-02-06 MED ORDER — DIPHENHYDRAMINE HCL 50 MG/ML IJ SOLN
12.5000 mg | INTRAMUSCULAR | Status: DC | PRN
  Administered 2015-02-06: 12.5 mg via INTRAVENOUS
  Filled 2015-02-06: qty 1

## 2015-02-06 NOTE — ED Notes (Signed)
Patient called a second time and is now in ED waiting room.

## 2015-02-06 NOTE — ED Notes (Signed)
Dr.Knapp at bedside  

## 2015-02-06 NOTE — ED Notes (Signed)
Called patient multiple times to transport patient in room no answer.

## 2015-02-06 NOTE — ED Notes (Signed)
Pt states that when she has pain medication she is unable to urinate and that she has had to have an in and out cath before so that she can pee. She states that sometimes it gets so bad she feels "like im going to burst"

## 2015-02-06 NOTE — ED Notes (Signed)
Pt c/o left side pain in shoulder and hip that feels like sickle cell pain x 3 days

## 2015-02-06 NOTE — ED Notes (Signed)
Pt is [redacted] weeks pregnant

## 2015-02-06 NOTE — ED Provider Notes (Signed)
CSN: [id number]     Arrival date & time 02/06/15  1629 History   First MD Initiated Contact with Patient 02/06/15 1944     Chief Complaint  Patient presents with  . Sickle Cell Pain Crisis    Patient is a 24 y.o. female presenting with sickle cell pain.  Sickle Cell Pain Crisis Pain location: Involving her left leg and shoulder and her back. Severity:  Moderate Onset quality:  Gradual Duration:  3 days Timing:  Constant Progression:  Worsening Context: not change in medication, not dehydration and not non-compliance   Relieved by:  Nothing Worsened by:  Nothing tried Ineffective treatments:  OTC medications Associated symptoms: no chest pain, no cough, no fever and no shortness of breath    patient is also 9 weeks' pregnant. She denies any trouble with vaginal bleeding or discharge. No abdominal pain nausea or vomiting.  Past Medical History  Diagnosis Date  . Sickle cell anemia    Past Surgical History  Procedure Laterality Date  . Cholecystectomy  2007   Family History  Problem Relation Age of Onset  . Sickle cell anemia Sister   . Diabetes Paternal Grandmother    History  Substance Use Topics  . Smoking status: Never Smoker   . Smokeless tobacco: Not on file  . Alcohol Use: No   OB History    Gravida Para Term Preterm AB TAB SAB Ectopic Multiple Living   1              Review of Systems  Constitutional: Negative for fever.  Respiratory: Negative for cough and shortness of breath.   Cardiovascular: Negative for chest pain.  All other systems reviewed and are negative.     Allergies  Ceftin  Home Medications   Prior to Admission medications   Medication Sig Start Date End Date Taking? Authorizing Provider  acetaminophen (TYLENOL) 500 MG tablet Take 1,000 mg by mouth every 6 (six) hours as needed for mild pain.   Yes Historical Provider, MD  Prenatal Vit-Fe Fumarate-FA (MULTIVITAMIN-PRENATAL) 27-0.8 MG TABS tablet Take 1 tablet by mouth daily at 12 noon.    Yes Historical Provider, MD   BP 93/52 mmHg  Pulse 81  Temp(Src) 98.4 F (36.9 C) (Oral)  Resp 20  SpO2 99%  LMP 12/04/2014 Physical Exam  Constitutional: She appears well-developed and well-nourished. No distress.  HENT:  Head: Normocephalic and atraumatic.  Right Ear: External ear normal.  Left Ear: External ear normal.  Eyes: Conjunctivae are normal. Right eye exhibits no discharge. Left eye exhibits no discharge. No scleral icterus.  Neck: Neck supple. No tracheal deviation present.  Cardiovascular: Normal rate, regular rhythm and intact distal pulses.   Pulmonary/Chest: Effort normal and breath sounds normal. No stridor. No respiratory distress. She has no wheezes. She has no rales.  Abdominal: Soft. Bowel sounds are normal. She exhibits no distension. There is no tenderness. There is no rebound and no guarding.  Musculoskeletal: She exhibits no edema or tenderness.  Neurological: She is alert. She has normal strength. No cranial nerve deficit (no facial droop, extraocular movements intact, no slurred speech) or sensory deficit. She exhibits normal muscle tone. She displays no seizure activity. Coordination normal.  Skin: Skin is warm and dry. No rash noted.  Psychiatric: She has a normal mood and affect.  Nursing note and vitals reviewed.   ED Course  Procedures (including critical care time) Labs Review Labs Reviewed  CBC WITH DIFFERENTIAL/PLATELET - Abnormal; Notable for the following:  WBC 11.8 (*)    RBC 2.53 (*)    Hemoglobin 9.7 (*)    HCT 26.2 (*)    MCV 103.6 (*)    MCH 38.3 (*)    MCHC 37.0 (*)    Platelets 481 (*)    Neutro Abs 8.3 (*)    Monocytes Absolute 1.1 (*)    All other components within normal limits  COMPREHENSIVE METABOLIC PANEL - Abnormal; Notable for the following:    Glucose, Bld 103 (*)    BUN <5 (*)    Creatinine, Ser 0.34 (*)    AST 45 (*)    Total Bilirubin 2.3 (*)    All other components within normal limits  RETICULOCYTES -  Abnormal; Notable for the following:    Retic Ct Pct 11.9 (*)    RBC. 2.53 (*)    Retic Count, Manual 301.1 (*)    All other components within normal limits  PROTIME-INR  URINALYSIS, ROUTINE W REFLEX MICROSCOPIC (NOT AT [facility])    Imaging Review Dg Chest 2 View  02/06/2015   CLINICAL DATA:  Cough and congestion for 4 days.  EXAM: CHEST  2 VIEW  COMPARISON:  None.  FINDINGS: The heart size and mediastinal contours are within normal limits. Both lungs are clear. No pneumothorax or pleural effusion is noted. The visualized skeletal structures are unremarkable.  IMPRESSION: No active cardiopulmonary disease.   Electronically Signed   By: Marijo Conception, M.D.   On: 02/06/2015 21:09    Medications  0.9 %  sodium chloride infusion (0 mLs Intravenous Stopped 02/06/15 2251)    Followed by  0.9 %  sodium chloride infusion (0 mLs Intravenous Stopped 02/06/15 2251)    Followed by  0.9 %  sodium chloride infusion (1,000 mLs Intravenous New Bag/Given 02/06/15 2034)  diphenhydrAMINE (BENADRYL) injection 12.5 mg (12.5 mg Intravenous Given 02/06/15 2035)  ondansetron (ZOFRAN) injection 4 mg (4 mg Intravenous Given 02/06/15 2035)  HYDROmorphone (DILAUDID) injection 1 mg (1 mg Intravenous Given 02/06/15 2143)  HYDROmorphone (DILAUDID) injection 1 mg (1 mg Intravenous Given 02/06/15 2252)     MDM   Final diagnoses:  Sickle cell crisis    Pt is having persistent pain.  Not improving after meds.  Plan on admission for further treatment.    Dorie Rank, MD 02/06/15 [phone]

## 2015-02-07 ENCOUNTER — Encounter (HOSPITAL_COMMUNITY): Payer: Self-pay | Admitting: *Deleted

## 2015-02-07 DIAGNOSIS — J209 Acute bronchitis, unspecified: Secondary | ICD-10-CM | POA: Diagnosis present

## 2015-02-07 DIAGNOSIS — D5700 Hb-SS disease with crisis, unspecified: Secondary | ICD-10-CM | POA: Diagnosis present

## 2015-02-07 DIAGNOSIS — O99011 Anemia complicating pregnancy, first trimester: Secondary | ICD-10-CM | POA: Diagnosis not present

## 2015-02-07 DIAGNOSIS — Z833 Family history of diabetes mellitus: Secondary | ICD-10-CM | POA: Diagnosis not present

## 2015-02-07 DIAGNOSIS — J029 Acute pharyngitis, unspecified: Secondary | ICD-10-CM | POA: Diagnosis present

## 2015-02-07 DIAGNOSIS — Z3A09 9 weeks gestation of pregnancy: Secondary | ICD-10-CM | POA: Diagnosis not present

## 2015-02-07 DIAGNOSIS — D571 Sickle-cell disease without crisis: Secondary | ICD-10-CM

## 2015-02-07 DIAGNOSIS — Z832 Family history of diseases of the blood and blood-forming organs and certain disorders involving the immune mechanism: Secondary | ICD-10-CM | POA: Diagnosis not present

## 2015-02-07 LAB — URINALYSIS, ROUTINE W REFLEX MICROSCOPIC
Bilirubin Urine: NEGATIVE
Glucose, UA: NEGATIVE mg/dL
Hgb urine dipstick: NEGATIVE
KETONES UR: NEGATIVE mg/dL
Leukocytes, UA: NEGATIVE
Nitrite: NEGATIVE
PH: 6.5 (ref 5.0–8.0)
PROTEIN: NEGATIVE mg/dL
Specific Gravity, Urine: 1.009 (ref 1.005–1.030)
Urobilinogen, UA: 0.2 mg/dL (ref 0.0–1.0)

## 2015-02-07 LAB — COMPREHENSIVE METABOLIC PANEL
ALBUMIN: 3.5 g/dL (ref 3.5–5.0)
ALT: 17 U/L (ref 14–54)
ANION GAP: 6 (ref 5–15)
AST: 33 U/L (ref 15–41)
Alkaline Phosphatase: 54 U/L (ref 38–126)
CALCIUM: 8.5 mg/dL — AB (ref 8.9–10.3)
CHLORIDE: 109 mmol/L (ref 101–111)
CO2: 23 mmol/L (ref 22–32)
Glucose, Bld: 100 mg/dL — ABNORMAL HIGH (ref 65–99)
POTASSIUM: 3.5 mmol/L (ref 3.5–5.1)
Sodium: 138 mmol/L (ref 135–145)
TOTAL PROTEIN: 6.7 g/dL (ref 6.5–8.1)
Total Bilirubin: 1.4 mg/dL — ABNORMAL HIGH (ref 0.3–1.2)

## 2015-02-07 LAB — RAPID URINE DRUG SCREEN, HOSP PERFORMED
AMPHETAMINES: NOT DETECTED
BARBITURATES: NOT DETECTED
Benzodiazepines: NOT DETECTED
Cocaine: NOT DETECTED
OPIATES: POSITIVE — AB
TETRAHYDROCANNABINOL: NOT DETECTED

## 2015-02-07 LAB — INFLUENZA PANEL BY PCR (TYPE A & B)
H1N1 flu by pcr: NOT DETECTED
INFLAPCR: NEGATIVE
Influenza B By PCR: NEGATIVE

## 2015-02-07 LAB — CBC
HEMATOCRIT: 22.5 % — AB (ref 36.0–46.0)
HEMOGLOBIN: 7.9 g/dL — AB (ref 12.0–15.0)
MCH: 36.6 pg — ABNORMAL HIGH (ref 26.0–34.0)
MCHC: 35.1 g/dL (ref 30.0–36.0)
MCV: 104.2 fL — AB (ref 78.0–100.0)
Platelets: 431 10*3/uL — ABNORMAL HIGH (ref 150–400)
RBC: 2.16 MIL/uL — ABNORMAL LOW (ref 3.87–5.11)
RDW: 15.3 % (ref 11.5–15.5)
WBC: 12.5 10*3/uL — AB (ref 4.0–10.5)

## 2015-02-07 LAB — APTT: aPTT: 34 seconds (ref 24–37)

## 2015-02-07 MED ORDER — SODIUM CHLORIDE 0.9 % IJ SOLN: 9.0000 mL | INTRAMUSCULAR | Status: DC | PRN

## 2015-02-07 MED ORDER — HYDROMORPHONE 0.3 MG/ML IV SOLN
INTRAVENOUS | Status: DC
  Administered 2015-02-07 (×2): via INTRAVENOUS
  Administered 2015-02-07: 3.5 mg via INTRAVENOUS
  Administered 2015-02-08: 0.3 mg via INTRAVENOUS
  Administered 2015-02-08: 2.1 mg via INTRAVENOUS
  Administered 2015-02-08: 3 mg via INTRAVENOUS
  Administered 2015-02-08: 1.8 mg via INTRAVENOUS
  Administered 2015-02-08: 3.6 mg via INTRAVENOUS
  Administered 2015-02-09: 02:00:00 via INTRAVENOUS
  Administered 2015-02-09: 0.6 mg via INTRAVENOUS
  Administered 2015-02-09: 0.9 mg via INTRAVENOUS
  Filled 2015-02-07 (×4): qty 25

## 2015-02-07 MED ORDER — ACETAMINOPHEN 325 MG PO TABS: 650.0000 mg | ORAL_TABLET | Freq: Four times a day (QID) | ORAL | Status: DC | PRN

## 2015-02-07 MED ORDER — DIPHENHYDRAMINE HCL 12.5 MG/5ML PO ELIX: 12.5000 mg | ORAL_SOLUTION | Freq: Four times a day (QID) | ORAL | Status: DC | PRN

## 2015-02-07 MED ORDER — PRENATAL 27-0.8 MG PO TABS
1.0000 | ORAL_TABLET | Freq: Every day | ORAL | Status: DC
  Administered 2015-02-07 – 2015-02-09 (×3): 1 via ORAL
  Filled 2015-02-07 (×3): qty 1

## 2015-02-07 MED ORDER — HYDROMORPHONE HCL 1 MG/ML IJ SOLN: 1.0000 mg | INTRAMUSCULAR | Status: DC | PRN

## 2015-02-07 MED ORDER — ONDANSETRON HCL 4 MG/2ML IJ SOLN
4.0000 mg | Freq: Four times a day (QID) | INTRAMUSCULAR | Status: DC | PRN
  Administered 2015-02-07 (×2): 4 mg via INTRAVENOUS
  Filled 2015-02-07 (×2): qty 2

## 2015-02-07 MED ORDER — NALOXONE HCL 0.4 MG/ML IJ SOLN: 0.4000 mg | INTRAMUSCULAR | Status: DC | PRN

## 2015-02-07 MED ORDER — ENSURE ENLIVE PO LIQD
237.0000 mL | Freq: Two times a day (BID) | ORAL | Status: DC
  Administered 2015-02-07 – 2015-02-09 (×5): 237 mL via ORAL

## 2015-02-07 MED ORDER — DIPHENHYDRAMINE HCL 50 MG/ML IJ SOLN
12.5000 mg | Freq: Four times a day (QID) | INTRAMUSCULAR | Status: DC | PRN
  Filled 2015-02-07: qty 1

## 2015-02-07 MED ORDER — SODIUM CHLORIDE 0.9 % IJ SOLN: 3.0000 mL | Freq: Two times a day (BID) | INTRAMUSCULAR | Status: DC

## 2015-02-07 NOTE — H&P (Signed)
[facility] History and Physical  Kanchan Gal VEH:[id number] DOB: [birth year] DOA: 02/06/2015  Referring physician: ED physician PCP: Sabino Snipes, PA-C  Specialists:   Chief Complaint: Pain over left side, sore throat, 9 week pregnancy  HPI: Gabriella Griffin is a 24 y.o. female with PMH of past medical history of sickle cell disease, who presents with pain over left shoulder, back and leg, sore throat and  9 week pregnancy.  Patient reports that she has a worsening pain in the past 3 weeks, over her left shoulder, back and leg her pain has been progressively getting worse, now is 6 out of 10 in severity after Dilaudid in the emergency room. Patient does not have chest pain, fever or chills. She has some mild dried cough, sore throat and runny nose in the past 4 days. No symptoms of UTI, abdominal pain, diarrhea, unilateral weakness. She dose not have vaginal discharge or vaginal bleeding.  In ED, patient was found to have negative chest x-ray, WBC 11.8, Hgb 9.0 on 6/21/6-->9.7,  temperature normal, tachycardia, electrolytes okay.   Where does patient live?   At home   Can patient participate in ADLs?  Yes     Review of Systems:   General: no fevers, chills, no changes in body weight, has fatigue HEENT: no blurry vision, hearing changes or sore throat Pulm: has dyspnea, coughing, running nose and sore throat, no wheezing CV: no chest pain, palpitations Abd: no nausea, vomiting, abdominal pain, diarrhea, constipation GU: no dysuria, burning on urination, increased urinary frequency, hematuria  Ext: no leg edema Neuro: no unilateral weakness, numbness, or tingling, no vision change or hearing loss Skin: no rash MSK: No muscle spasm, no deformity, no limitation of range of movement in spin Heme: No easy bruising.  Travel history: No recent long distant travel.  Allergy:  Allergies  Allergen Reactions  . Ceftin [Cefuroxime Axetil] Rash    Past Medical History   Diagnosis Date  . Sickle cell anemia   . Sickle cell anemia 1996    Past Surgical History  Procedure Laterality Date  . Cholecystectomy  2007    Social History:  reports that she has never smoked. She does not have any smokeless tobacco history on file. She reports that she does not drink alcohol or use illicit drugs.  Family History:  Family History  Problem Relation Age of Onset  . Sickle cell anemia Sister   . Diabetes Paternal Grandmother      Prior to Admission medications   Medication Sig Start Date End Date Taking? Authorizing Provider  acetaminophen (TYLENOL) 500 MG tablet Take 1,000 mg by mouth every 6 (six) hours as needed for mild pain.   Yes Historical Provider, MD  Prenatal Vit-Fe Fumarate-FA (MULTIVITAMIN-PRENATAL) 27-0.8 MG TABS tablet Take 1 tablet by mouth daily at 12 noon.   Yes Historical Provider, MD    Physical Exam: Filed Vitals:   02/07/15 0246 02/07/15 0300 02/07/15 0347 02/07/15 0400  BP: 108/68     Pulse: 79     Temp: 98.6 F (37 C)     TempSrc: Oral     Resp: 20  24 24   Height:  5\' 2"  (1.575 m)    Weight:  52 kg (114 lb 10.2 oz)    SpO2: 99%  100% 100%   General: Not in acute distress HEENT:       Eyes: PERRL, EOMI, no scleral icterus.       ENT: No discharge from the ears and nose, no  pharynx injection, no tonsillar enlargement.        Neck: No JVD, no bruit, no mass felt. Heme: No neck lymph node enlargement. Cardiac: S1/S2, RRR, No murmurs, No gallops or rubs. Pulm: No rales, wheezing, rhonchi or rubs. Abd: Soft, nondistended, nontender, no rebound pain, no organomegaly, BS present. Ext: No pitting leg edema bilaterally. 2+DP/PT pulse bilaterally. Musculoskeletal: No joint deformities, No joint redness or warmth, no limitation of ROM in spin. Skin: No rashes.  Neuro: Alert, oriented X3, cranial nerves II-XII grossly intact, muscle strength 5/5 in all extremities, sensation to light touch intact.  Psych: Patient is not psychotic, no  suicidal or hemocidal ideation.  Labs on Admission:  Basic Metabolic Panel:  Recent Labs Lab 02/06/15 1652  NA 137  K 3.7  CL 107  CO2 23  GLUCOSE 103*  BUN <5*  CREATININE 0.34*  CALCIUM 9.2   Liver Function Tests:  Recent Labs Lab 02/06/15 1652  AST 45*  ALT 22  ALKPHOS 63  BILITOT 2.3*  PROT 7.6  ALBUMIN 3.9   No results for input(s): LIPASE, AMYLASE in the last 168 hours. No results for input(s): AMMONIA in the last 168 hours. CBC:  Recent Labs Lab 02/04/15 1631 02/06/15 1652 02/07/15 0543  WBC 13.0* 11.8* 12.5*  NEUTROABS 9.9* 8.3*  --   HGB 9.0* 9.7* 7.9*  HCT 25.9* 26.2* 22.5*  MCV 107.9* 103.6* 104.2*  PLT 483* 481* 431*   Cardiac Enzymes: No results for input(s): CKTOTAL, CKMB, CKMBINDEX, TROPONINI in the last 168 hours.  BNP (last 3 results) No results for input(s): BNP in the last 8760 hours.  ProBNP (last 3 results) No results for input(s): PROBNP in the last 8760 hours.  CBG: No results for input(s): GLUCAP in the last 168 hours.  Radiological Exams on Admission: Dg Chest 2 View  02/06/2015   CLINICAL DATA:  Cough and congestion for 4 days.  EXAM: CHEST  2 VIEW  COMPARISON:  None.  FINDINGS: The heart size and mediastinal contours are within normal limits. Both lungs are clear. No pneumothorax or pleural effusion is noted. The visualized skeletal structures are unremarkable.  IMPRESSION: No active cardiopulmonary disease.   Electronically Signed   By: Marijo Conception, M.D.   On: 02/06/2015 21:09    EKG: Not done in ED, will get one.   Assessment/Plan Principal Problem:   Sickle cell pain crisis Active Problems:   Maternal sickle cell anemia complicating pregnancy   Sickle cell anemia   Sore throat  Sickle cell disease and Sickle cell pain crisis: Patient does not have indication for antibiotics. Hgb 9.0 on 6/21/6-->9.7. Does not need transfusion at this moment. Patient would like to limit medication use due to pregnancy. She stopped  taking hydroxyurea and folic acid. She is taking prenatal vitamin.  -Admit to tele bed given tachycardia. -Start PCA protocol for pain -Benadryl for itch -IVF: 2L NS and 125 cc/h -Zofran for nausea  9 week pregnancy: stable. No alarming symptoms of abortion. -check UA and HIV ab -prenatal vitamin  Sore throat:  -follow up flu pcr -prn tylenol  Mild leukocytosis: WBC 11.8, likely due to stress-induced demargination. -Follow-up by CBC   DVT ppx: SCD  Code Status: Full code Family Communication:  Yes, patient's  bodyfriend     at bed side Disposition Plan: Admit to inpatient   Date of Service 02/07/2015    [name] [facility] Pager [phone]  If 7PM-7AM, please contact night-coverage [email or web address] Password [facility] 02/07/2015, 6:24 AM

## 2015-02-07 NOTE — Plan of Care (Signed)
Problem: Phase I Progression Outcomes Goal: Pt. aware of pain management plan Outcome: Completed/Met Date Met:  02/07/15 Patient is on Dilaudid PCA pump.Marland KitchenMarland Kitchen

## 2015-02-07 NOTE — Progress Notes (Signed)
Nutrition Brief Note  Patient identified on the Malnutrition Screening Tool (MST) Report  Wt Readings from Last 15 Encounters:  02/07/15 114 lb 10.2 oz (52 kg)  02/04/15 113 lb 12.8 oz (51.619 kg)  11/02/14 114 lb (51.71 kg)    Body mass index is 20.96 kg/(m^2). Patient meets criteria for normal weight status based on current BMI.   Current diet order is Regular, patient is consuming approximately 100% of meals at this time. Labs and medications reviewed.   Pt reports good appetite now and PTA. She indicates that appetite may have slightly decreased recently due to pregnancy (9 weeks) with nausea. She states she eats small meals to help with this and that she drank Ensure provided to her this AM and enjoyed it.  No nutrition interventions warranted at this time. If nutrition issues arise, please consult RD.     Jarome Matin, RD, LDN Inpatient Clinical Dietitian Pager # [phone] After hours/weekend pager # [phone]

## 2015-02-07 NOTE — ED Notes (Signed)
Report attempted, RN not available. 

## 2015-02-08 DIAGNOSIS — D5700 Hb-SS disease with crisis, unspecified: Secondary | ICD-10-CM

## 2015-02-08 LAB — HIV ANTIBODY (ROUTINE TESTING W REFLEX): HIV Screen 4th Generation wRfx: NONREACTIVE

## 2015-02-08 LAB — GLUCOSE, CAPILLARY: Glucose-Capillary: 87 mg/dL (ref 65–99)

## 2015-02-08 NOTE — Progress Notes (Signed)
Subjective: A 24 year old female admitted with sickle cell painful crisis. Patient is nave to opiates. She also has respiratory symptoms presumed to be bronchitis. Viral illness was also suspected. She is on Dilaudid PCA and has used 10 mg with 34 demands and 34 deliveries in the last 24 hours. Patient continues to cough and has some mild shortness of breath. Patient is also pregnant [redacted] weeks now. She has no complaint regarding the pregnancy.  Objective: Vital signs in last 24 hours: Temp:  [98.2 F (36.8 C)-98.9 F (37.2 C)] 98.2 F (36.8 C) (06/25 0454) Pulse Rate:  [84-92] 92 (06/25 0454) Resp:  [18-23] 20 (06/25 1200) BP: (99-102)/(54-59) 102/59 mmHg (06/25 0454) SpO2:  [94 %-99 %] 97 % (06/25 1200) FiO2 (%):  [3 %] 3 % (06/25 0454) Weight change:  Last BM Date: 02/07/15 (per pt)  Intake/Output from previous day: 06/24 0701 - 06/25 0700 In: 1580 [P.O.:480; I.V.:1100] Out: 2000 [Urine:2000] Intake/Output this shift: Total I/O In: 240 [P.O.:240] Out: -   General appearance: alert, cooperative and no distress Head: Normocephalic, without obvious abnormality, atraumatic Eyes: conjunctivae/corneas clear. PERRL, EOM's intact. Fundi benign. Neck: no adenopathy, no carotid bruit, no JVD, supple, symmetrical, trachea midline and thyroid not enlarged, symmetric, no tenderness/mass/nodules Back: symmetric, no curvature. ROM normal. No CVA tenderness. Resp: clear to auscultation bilaterally Chest wall: no tenderness Cardio: regular rate and rhythm, S1, S2 normal, no murmur, click, rub or gallop GI: soft, non-tender; bowel sounds normal; no masses,  no organomegaly Extremities: extremities normal, atraumatic, no cyanosis or edema Pulses: 2+ and symmetric Skin: Skin color, texture, turgor normal. No rashes or lesions Neurologic: Grossly normal  Lab Results:  Recent Labs  02/06/15 1652 02/07/15 0543  WBC 11.8* 12.5*  HGB 9.7* 7.9*  HCT 26.2* 22.5*  PLT 481* 431*    BMET  Recent Labs  02/06/15 1652 02/07/15 0543  NA 137 138  K 3.7 3.5  CL 107 109  CO2 23 23  GLUCOSE 103* 100*  BUN <5* <5*  CREATININE 0.34* <0.30*  CALCIUM 9.2 8.5*    Studies/Results: Dg Chest 2 View  02/06/2015   CLINICAL DATA:  Cough and congestion for 4 days.  EXAM: CHEST  2 VIEW  COMPARISON:  None.  FINDINGS: The heart size and mediastinal contours are within normal limits. Both lungs are clear. No pneumothorax or pleural effusion is noted. The visualized skeletal structures are unremarkable.  IMPRESSION: No active cardiopulmonary disease.   Electronically Signed   By: Marijo Conception, M.D.   On: 02/06/2015 21:09    Medications: I have reviewed the patient's current medications.  Assessment/Plan: A 24 year old female who is [redacted] weeks pregnant admitted with sickle cell crisis.  #1 sickle cell painful crisis: Patient is not using much of the PCA. She cannot use Toradol or any non-steroidal due to pregnancy. I will keep her on the PCA overnight but transition her to some oral pain medicine when she leaves the hospital.  #2 acute bronchitis: She has sore throat with the respiratory symptoms. Patient will be on supportive care mainly. Her flu PCR is negative.  #3 sickle cell anemia: Hemoglobin slightly dropped but most likely due to hemodilution.  #4 intrauterine pregnancy: Patient seems to be stable with no evidence of any issues regarding the pregnancy.  LOS: 1 day   Izacc Demeyer,LAWAL 02/08/2015, 2:09 PM

## 2015-02-09 LAB — GLUCOSE, CAPILLARY
GLUCOSE-CAPILLARY: 87 mg/dL (ref 65–99)
Glucose-Capillary: 98 mg/dL (ref 65–99)

## 2015-02-09 MED ORDER — OXYCODONE-ACETAMINOPHEN 5-325 MG PO TABS: 1.0000 | ORAL_TABLET | ORAL | Status: DC | PRN

## 2015-02-09 NOTE — Progress Notes (Signed)
Patient discharged.  Accompanied by significant other.  Leaving with one prescription and personal belongings.  No complaints.  No s/s of distress.  Room air.

## 2015-02-16 NOTE — Discharge Summary (Signed)
Physician Discharge Summary  Patient ID: Gabriella Griffin MRN: [id number] DOB/AGE: [birth year] 24 y.o.  Admit date: 02/06/2015 Discharge date: 02/09/2015  Admission Diagnoses:  Discharge Diagnoses:  Principal Problem:   Sickle cell pain crisis Active Problems:   Maternal sickle cell anemia complicating pregnancy   Sickle cell anemia   Sore throat   Discharged Condition: good  Hospital Course: Patient admitted with sickle cell painful crisis and upper respiratory tract infection. She is pregnant also in the first trimester. She was treated with IV dilaudid PCA and IVF. No NSAIDS given due to pregnancy. She responded to treatment well. Also Flu assys done and negative. Seems she had some bronchitis. Patient was subsequently discharged to follow up with PCP.   Consults: None  Significant Diagnostic Studies: labs: CBc and CMP checked serially. Within her normal limits.  Treatments: IV hydration and analgesia: Dilaudid  Discharge Exam: Blood pressure 104/66, pulse 95, temperature 97.9 F (36.6 C), temperature source Oral, resp. rate 24, height 5\' 2"  (1.575 m), weight 52 kg (114 lb 10.2 oz), last menstrual period 12/04/2014, SpO2 95 %. General appearance: alert, cooperative and no distress Head: Normocephalic, without obvious abnormality, atraumatic Neck: no adenopathy, no carotid bruit, no JVD, supple, symmetrical, trachea midline and thyroid not enlarged, symmetric, no tenderness/mass/nodules Back: symmetric, no curvature. ROM normal. No CVA tenderness. Resp: clear to auscultation bilaterally Chest wall: no tenderness Cardio: regular rate and rhythm, S1, S2 normal, no murmur, click, rub or gallop GI: soft, non-tender; bowel sounds normal; no masses,  no organomegaly Pulses: 2+ and symmetric Skin: Skin color, texture, turgor normal. No rashes or lesions Neurologic: Grossly normal  Disposition: 01-Home or Self Care     Medication List    TAKE these medications        acetaminophen 500 MG tablet  Commonly known as:  TYLENOL  Take 1,000 mg by mouth every 6 (six) hours as needed for mild pain.     multivitamin-prenatal 27-0.8 MG Tabs tablet  Take 1 tablet by mouth daily at 12 noon.     oxyCODONE-acetaminophen 5-325 MG per tablet  Commonly known as:  ROXICET  Take 1 tablet by mouth every 4 (four) hours as needed for severe pain.         SignedBarbette Merino 02/09/2015, 10:46 AM  Time spent 31 minutes

## 2015-02-28 ENCOUNTER — Encounter (HOSPITAL_COMMUNITY): Payer: Self-pay

## 2015-02-28 ENCOUNTER — Ambulatory Visit (HOSPITAL_COMMUNITY)
Admission: RE | Admit: 2015-02-28 | Discharge: 2015-02-28 | Disposition: A | Discharge status: Home / Self Care | Payer: Self-pay | Source: Ambulatory Visit | Attending: Obstetrics & Gynecology | Admitting: Obstetrics & Gynecology

## 2015-02-28 DIAGNOSIS — Z3A12 12 weeks gestation of pregnancy: Secondary | ICD-10-CM | POA: Insufficient documentation

## 2015-02-28 DIAGNOSIS — Z3682 Encounter for antenatal screening for nuchal translucency: Secondary | ICD-10-CM | POA: Insufficient documentation

## 2015-02-28 DIAGNOSIS — Z3491 Encounter for supervision of normal pregnancy, unspecified, first trimester: Secondary | ICD-10-CM | POA: Insufficient documentation

## 2015-03-06 ENCOUNTER — Ambulatory Visit (INDEPENDENT_AMBULATORY_CARE_PROVIDER_SITE_OTHER): Payer: Self-pay | Admitting: Family Medicine

## 2015-03-06 VITALS — BP 116/62 | HR 92 | Temp 98.9°F | Wt 112.5 lb

## 2015-03-06 DIAGNOSIS — D571 Sickle-cell disease without crisis: Secondary | ICD-10-CM

## 2015-03-06 DIAGNOSIS — O99011 Anemia complicating pregnancy, first trimester: Secondary | ICD-10-CM

## 2015-03-06 DIAGNOSIS — O0990 Supervision of high risk pregnancy, unspecified, unspecified trimester: Secondary | ICD-10-CM

## 2015-03-06 LAB — POCT URINALYSIS DIP (DEVICE)
Glucose, UA: NEGATIVE mg/dL
Ketones, ur: NEGATIVE mg/dL
Leukocytes, UA: NEGATIVE
Nitrite: NEGATIVE
PH: 6 (ref 5.0–8.0)
Protein, ur: NEGATIVE mg/dL
SPECIFIC GRAVITY, URINE: 1.015 (ref 1.005–1.030)
UROBILINOGEN UA: 0.2 mg/dL (ref 0.0–1.0)

## 2015-03-06 NOTE — Progress Notes (Signed)
Subjective:  Gabriella Griffin is a 24 y.o. G1P0 at [redacted]w[redacted]d being seen today for ongoing prenatal care.  Patient reports nausea.  Contractions: Not present.  Vag. Bleeding: Scant.  . Denies leaking of fluid.   The following portions of the patient's history were reviewed and updated as appropriate: allergies, current medications, past family history, past medical history, past social history, past surgical history and problem list.   Objective:   Filed Vitals:   03/06/15 1127  BP: 116/62  Pulse: 92  Temp: 98.9 F (37.2 C)  Weight: 112 lb 8 oz (51.03 kg)    Fetal Status: Fetal Heart Rate (bpm): 158         General:  Alert, oriented and cooperative. Patient is in no acute distress.  Skin: Skin is warm and dry. No rash noted.   Cardiovascular: Normal heart rate noted  Respiratory: Normal respiratory effort, no problems with respiration noted  Abdomen: Soft, gravid, appropriate for gestational age. Pain/Pressure: Present     Vaginal: Vag. Bleeding: Scant.       Cervix: Not evaluated        Extremities: Normal range of motion.  Edema: None  Mental Status: Normal mood and affect. Normal behavior. Normal judgment and thought content.   Urinalysis: Urine Protein: Negative Urine Glucose: Negative  Assessment and Plan:  Pregnancy: G1P0 at [redacted]w[redacted]d  1. Supervision of high-risk pregnancy, unspecified trimester FHT and Fundal height normal  - US OB Comp + 14 Wk; Future  2. Maternal sickle cell anemia complicating pregnancy, first trimester Had episode of crisis end of June.  No complications since then  Preterm labor symptoms and general obstetric precautions including but not limited to vaginal bleeding, contractions, leaking of fluid and fetal movement were reviewed in detail with the patient. Please refer to After Visit Summary for other counseling recommendations.  Return in about 4 weeks (around 04/03/2015).   Truett Mainland, DO

## 2015-03-06 NOTE — Patient Instructions (Signed)
Second Trimester of Pregnancy The second trimester is from week 13 through week 28, months 4 through 6. The second trimester is often a time when you feel your best. Your body has also adjusted to being pregnant, and you begin to feel better physically. Usually, morning sickness has lessened or quit completely, you may have more energy, and you may have an increase in appetite. The second trimester is also a time when the fetus is growing rapidly. At the end of the sixth month, the fetus is about 9 inches long and weighs about 1 pounds. You will likely begin to feel the baby move (quickening) between 18 and 20 weeks of the pregnancy. BODY CHANGES Your body goes through many changes during pregnancy. The changes vary from woman to woman.   Your weight will continue to increase. You will notice your lower abdomen bulging out.  You may begin to get stretch marks on your hips, abdomen, and breasts.  You may develop headaches that can be relieved by medicines approved by your health care provider.  You may urinate more often because the fetus is pressing on your bladder.  You may develop or continue to have heartburn as a result of your pregnancy.  You may develop constipation because certain hormones are causing the muscles that push waste through your intestines to slow down.  You may develop hemorrhoids or swollen, bulging veins (varicose veins).  You may have back pain because of the weight gain and pregnancy hormones relaxing your joints between the bones in your pelvis and as a result of a shift in weight and the muscles that support your balance.  Your breasts will continue to grow and be tender.  Your gums may bleed and may be sensitive to brushing and flossing.  Dark spots or blotches (chloasma, mask of pregnancy) may develop on your face. This will likely fade after the baby is born.  A dark line from your belly button to the pubic area (linea nigra) may appear. This will likely fade  after the baby is born.  You may have changes in your hair. These can include thickening of your hair, rapid growth, and changes in texture. Some women also have hair loss during or after pregnancy, or hair that feels dry or thin. Your hair will most likely return to normal after your baby is born. WHAT TO EXPECT AT YOUR PRENATAL VISITS During a routine prenatal visit:  You will be weighed to make sure you and the fetus are growing normally.  Your blood pressure will be taken.  Your abdomen will be measured to track your baby's growth.  The fetal heartbeat will be listened to.  Any test results from the previous visit will be discussed. Your health care provider may ask you:  How you are feeling.  If you are feeling the baby move.  If you have had any abnormal symptoms, such as leaking fluid, bleeding, severe headaches, or abdominal cramping.  If you have any questions. Other tests that may be performed during your second trimester include:  Blood tests that check for:  Low iron levels (anemia).  Gestational diabetes (between 24 and 28 weeks).  Rh antibodies.  Urine tests to check for infections, diabetes, or protein in the urine.  An ultrasound to confirm the proper growth and development of the baby.  An amniocentesis to check for possible genetic problems.  Fetal screens for spina bifida and Down syndrome. HOME CARE INSTRUCTIONS   Avoid all smoking, herbs, alcohol, and unprescribed   drugs. These chemicals affect the formation and growth of the baby.  Follow your health care provider's instructions regarding medicine use. There are medicines that are either safe or unsafe to take during pregnancy.  Exercise only as directed by your health care provider. Experiencing uterine cramps is a good sign to stop exercising.  Continue to eat regular, healthy meals.  Wear a good support bra for breast tenderness.  Do not use hot tubs, steam rooms, or saunas.  Wear your  seat belt at all times when driving.  Avoid raw meat, uncooked cheese, cat litter boxes, and soil used by cats. These carry germs that can cause birth defects in the baby.  Take your prenatal vitamins.  Try taking a stool softener (if your health care provider approves) if you develop constipation. Eat more high-fiber foods, such as fresh vegetables or fruit and whole grains. Drink plenty of fluids to keep your urine clear or pale yellow.  Take warm sitz baths to soothe any pain or discomfort caused by hemorrhoids. Use hemorrhoid cream if your health care provider approves.  If you develop varicose veins, wear support hose. Elevate your feet for 15 minutes, 3-4 times a day. Limit salt in your diet.  Avoid heavy lifting, wear low heel shoes, and practice good posture.  Rest with your legs elevated if you have leg cramps or low back pain.  Visit your dentist if you have not gone yet during your pregnancy. Use a soft toothbrush to brush your teeth and be gentle when you floss.  A sexual relationship may be continued unless your health care provider directs you otherwise.  Continue to go to all your prenatal visits as directed by your health care provider. SEEK MEDICAL CARE IF:   You have dizziness.  You have mild pelvic cramps, pelvic pressure, or nagging pain in the abdominal area.  You have persistent nausea, vomiting, or diarrhea.  You have a bad smelling vaginal discharge.  You have pain with urination. SEEK IMMEDIATE MEDICAL CARE IF:   You have a fever.  You are leaking fluid from your vagina.  You have spotting or bleeding from your vagina.  You have severe abdominal cramping or pain.  You have rapid weight gain or loss.  You have shortness of breath with chest pain.  You notice sudden or extreme swelling of your face, hands, ankles, feet, or legs.  You have not felt your baby move in over an hour.  You have severe headaches that do not go away with  medicine.  You have vision changes. Document Released: 07/27/2001 Document Revised: 08/07/2013 Document Reviewed: 10/03/2012 ExitCare Patient Information 2015 [employer]. This information is not intended to replace advice given to you by your health care provider. Make sure you discuss any questions you have with your health care provider.  

## 2015-03-13 ENCOUNTER — Other Ambulatory Visit (HOSPITAL_COMMUNITY): Payer: Self-pay | Admitting: Family Medicine

## 2015-04-02 ENCOUNTER — Encounter: Payer: Self-pay | Admitting: Obstetrics & Gynecology

## 2015-04-03 ENCOUNTER — Encounter: Payer: Self-pay | Admitting: Obstetrics & Gynecology

## 2015-04-07 ENCOUNTER — Ambulatory Visit (HOSPITAL_COMMUNITY): Payer: MEDICAID

## 2015-04-11 ENCOUNTER — Ambulatory Visit (HOSPITAL_COMMUNITY)
Admission: RE | Admit: 2015-04-11 | Discharge: 2015-04-11 | Disposition: A | Discharge status: Home / Self Care | Payer: Self-pay | Source: Ambulatory Visit | Attending: Obstetrics & Gynecology | Admitting: Obstetrics & Gynecology

## 2015-04-11 ENCOUNTER — Other Ambulatory Visit: Payer: Self-pay | Admitting: Family Medicine

## 2015-04-11 VITALS — BP 108/54 | HR 74

## 2015-04-11 DIAGNOSIS — Z3A20 20 weeks gestation of pregnancy: Secondary | ICD-10-CM

## 2015-04-11 DIAGNOSIS — Z315 Encounter for genetic counseling: Secondary | ICD-10-CM | POA: Insufficient documentation

## 2015-04-11 DIAGNOSIS — D5700 Hb-SS disease with crisis, unspecified: Secondary | ICD-10-CM

## 2015-04-11 DIAGNOSIS — O352XX0 Maternal care for (suspected) hereditary disease in fetus, not applicable or unspecified: Secondary | ICD-10-CM

## 2015-04-11 DIAGNOSIS — Z3A18 18 weeks gestation of pregnancy: Secondary | ICD-10-CM

## 2015-04-11 DIAGNOSIS — O99012 Anemia complicating pregnancy, second trimester: Secondary | ICD-10-CM

## 2015-04-11 DIAGNOSIS — D571 Sickle-cell disease without crisis: Secondary | ICD-10-CM

## 2015-04-11 DIAGNOSIS — O0990 Supervision of high risk pregnancy, unspecified, unspecified trimester: Secondary | ICD-10-CM

## 2015-04-11 NOTE — Progress Notes (Addendum)
Genetic Counseling  High-Risk Gestation Note  Appointment Date:  04/11/2015 Referred By: Gabriella Boston, MD Date of Birth:  1991/07/19 Partner:  Rochel Griffin   Pregnancy History: G1P0 Estimated Date of Delivery: 09/07/15 Estimated Gestational Age: 67w5dAttending: MRenella Cunas MD  I met with Ms. Gabriella Houghand her partner, Gabriella Griffin for genetic counseling because of the patient's personal history of sickle cell anemia.  In Summary:   Patient with sickle cell anemia (Hb SS disease), [facility]  We reviewed autosomal recessive inheritance of sickle cell disease  Patient's partner, Gabriella Griffin not yet had screening. He [place] [facility] [place] Prior to screening, his risk to have sickle cell trait is approximately 1 in 60  If he has sickle cell trait (or other hemoglobin variant), pregnancy has 1 in 2 (50%) chance for hemoglobinopathy  If he has typical hemoglobin, every pregnancy together would be expected to have sickle cell trait (Hb AS)  Mr. Gabriella Retortindicated that his work schedule makes it difficult to have time to go for screening. [place] [facility] given that he does not currently have insurance.  Patient stated she would not be interested in prenatal diagnosis for sickle cell via amniocentesis  We began by reviewing the family history in detail. The patient reported that she and a full sister have sickle cell disease. 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 Mr. Gabriella Retortreported no known family history of sickle cell disease, sickle cell trait, or other hemoglobin variants. He reported Gabriella and denied consanguinity to Gabriella Griffin. Mr. Gabriella Retortreported that he had an [facility]  but reported that he missed the appointment due to work and has not yet had screening. The family histories were otherwise found to be noncontributory for birth defects, mental retardation, and known genetic conditions. Without further information regarding the provided family history, an accurate genetic risk cannot be calculated. Further genetic counseling is warranted if more information is obtained.  We discussed that sickle cell anemia (SCA) affects the shape and function of the red blood cell by producing abnormal hemoglobin. They were counseled that hemoglobin is a protein in the RBCs that carries oxygen to the body's organs. Individuals who have SCA have a changes within the genes that codes for hemoglobin.   We reviewed genes and specifically discussed that SCA is inherited in an autosomal recessive manner, meaning it occurs when both copies of the hemoglobin gene are changed and produce an abnormal hemoglobin S. Typically, one abnormal gene for the production of hemoglobin S is inherited from each parent. A carrier of SCA has one altered copy of the gene for hemoglobin and one typical working copy. Carriers of recessive conditions typically do not have symptoms related to the condition because they still have one functioning copy of the gene, and thus some production of the typical protein coded for by that gene.  We discussed that all pregnancies for Ms. MLyndewould be expected to inherit hemoglobin S, given that she is Hb SS, meaning all of her pregnancies will at least have sickle cell trait. Given the recessive inheritance, we discussed the importance of understanding Gabriella Griffin carrier status in order to accurately predict the risk of a hemoglobinopathy in the fetus. If he carries a beta  globin variant, such as hemoglobin S, then the pregnancy would have a 1 in 2 (50%) chance to inherit sickle cell disease. If he has typical hemoglobin (Hb AA), then the pregnancy would be expected to have  sickle cell trait (Hb AS) but not be at increased risk for sickle cell disease.   We discussed the screening via hemoglobin electrophoresis to assess for other hemoglobin variants (hemoglobin C) in addition to hemoglobin S can be performed, if desired. Prior to screening, Gabriella Griffin would have the general population chance of approximately 1 in 9 to have sickle cell trait, meaning the pregnancy has an approximate 1 in 120 risk for sickle cell disease. Gabriella Griffin declined screening at the time of today's appointment, given that he does not currently have medical insurance. The couple Griffin that if his work schedule allows, he will pursue screening through [place] [facility] the [place]. We discussed that prenatal diagnosis via amniocentesis would be available if desired for sickle cell disease, in the case where both parents were identified to have hemoglobin variants. We reviewed the risks, benefits, and limitations of amniocentesis including the associated [phone] in [phone] risk for complications, including spontaneous pregnancy loss. Gabriella Griffin that she is not interested in amniocentesis in the pregnancy. They understand that an accurate risk assessment cannot be performed without further information.We also reviewed the availability of newborn screening in [place] for hemoglobinopathies.   Gabriella Griffin previously had first trimester screening, which was within normal range for Down syndrome and trisomy 18/13. We reviewed these results with the patient. We discussed that this screen adjusts the a priori risk for these conditions to provide pregnancy specific risk assessment but is not diagnostic.   Gabriella Griffin denied exposure to environmental toxins or chemical agents. She reported that she had a chest x ray in June, when she was hospitalized with a sickle cell crises. She was shielded for this x ray exam.  The dose of ionizing radiation is an important factor in determining  the potential toxicity to a pregnancy. Available study data suggest x ray exposure at 5 rad or less is not associated with an increased risk for birth defects. This reported exposure is likely below this threshold. However, additional information regarding the specific radiation exposure from this exam is needed to accurately assess potential associated risks for the pregnancy.  She denied the use of alcohol, tobacco or street drugs. She denied significant viral illnesses during the course of her pregnancy. Her medical and surgical histories were contributory for sickle cell disease, as previously discussed.   I counseled this couple regarding the above risks and available options.  The approximate face-to-face time with the genetic counselor was 30 minutes.  Chipper Oman, MS Certified Genetic Counselor 04/11/2015

## 2015-04-24 ENCOUNTER — Encounter: Payer: Self-pay | Admitting: Family

## 2015-05-23 ENCOUNTER — Other Ambulatory Visit (HOSPITAL_COMMUNITY): Payer: Self-pay | Admitting: *Deleted

## 2015-05-23 ENCOUNTER — Encounter (HOSPITAL_COMMUNITY): Payer: Self-pay

## 2015-05-23 ENCOUNTER — Ambulatory Visit (HOSPITAL_COMMUNITY)
Admission: RE | Admit: 2015-05-23 | Discharge: 2015-05-23 | Disposition: A | Discharge status: Home / Self Care | Payer: Medicaid Other | Source: Ambulatory Visit | Attending: Obstetrics & Gynecology | Admitting: Obstetrics & Gynecology

## 2015-05-23 ENCOUNTER — Other Ambulatory Visit (HOSPITAL_COMMUNITY): Payer: Self-pay | Admitting: Maternal and Fetal Medicine

## 2015-05-23 DIAGNOSIS — Z3A24 24 weeks gestation of pregnancy: Secondary | ICD-10-CM

## 2015-05-23 DIAGNOSIS — D571 Sickle-cell disease without crisis: Secondary | ICD-10-CM

## 2015-05-23 DIAGNOSIS — O99019 Anemia complicating pregnancy, unspecified trimester: Principal | ICD-10-CM

## 2015-05-23 DIAGNOSIS — O352XX0 Maternal care for (suspected) hereditary disease in fetus, not applicable or unspecified: Secondary | ICD-10-CM | POA: Insufficient documentation

## 2015-06-11 ENCOUNTER — Encounter (HOSPITAL_COMMUNITY): Payer: Self-pay | Admitting: Emergency Medicine

## 2015-06-11 ENCOUNTER — Inpatient Hospital Stay (HOSPITAL_COMMUNITY)
Admission: EM | Admit: 2015-06-11 | Discharge: 2015-06-16 | DRG: 812 | Disposition: A | Discharge status: Home / Self Care | Payer: Medicaid Other | Attending: Internal Medicine | Admitting: Internal Medicine

## 2015-06-11 ENCOUNTER — Emergency Department (HOSPITAL_COMMUNITY): Payer: Medicaid Other

## 2015-06-11 DIAGNOSIS — K5900 Constipation, unspecified: Secondary | ICD-10-CM | POA: Diagnosis not present

## 2015-06-11 DIAGNOSIS — D564 Hereditary persistence of fetal hemoglobin [HPFH]: Secondary | ICD-10-CM | POA: Diagnosis present

## 2015-06-11 DIAGNOSIS — Z3A28 28 weeks gestation of pregnancy: Secondary | ICD-10-CM

## 2015-06-11 DIAGNOSIS — O99011 Anemia complicating pregnancy, first trimester: Secondary | ICD-10-CM

## 2015-06-11 DIAGNOSIS — Z3490 Encounter for supervision of normal pregnancy, unspecified, unspecified trimester: Secondary | ICD-10-CM

## 2015-06-11 DIAGNOSIS — O352XX0 Maternal care for (suspected) hereditary disease in fetus, not applicable or unspecified: Secondary | ICD-10-CM

## 2015-06-11 DIAGNOSIS — O0992 Supervision of high risk pregnancy, unspecified, second trimester: Secondary | ICD-10-CM

## 2015-06-11 DIAGNOSIS — Z833 Family history of diabetes mellitus: Secondary | ICD-10-CM | POA: Diagnosis not present

## 2015-06-11 DIAGNOSIS — D72829 Elevated white blood cell count, unspecified: Secondary | ICD-10-CM | POA: Diagnosis present

## 2015-06-11 DIAGNOSIS — K5909 Other constipation: Secondary | ICD-10-CM | POA: Diagnosis not present

## 2015-06-11 DIAGNOSIS — D571 Sickle-cell disease without crisis: Secondary | ICD-10-CM

## 2015-06-11 DIAGNOSIS — D5700 Hb-SS disease with crisis, unspecified: Principal | ICD-10-CM | POA: Diagnosis present

## 2015-06-11 DIAGNOSIS — Z832 Family history of diseases of the blood and blood-forming organs and certain disorders involving the immune mechanism: Secondary | ICD-10-CM

## 2015-06-11 DIAGNOSIS — Z331 Pregnant state, incidental: Secondary | ICD-10-CM | POA: Diagnosis present

## 2015-06-11 LAB — CBC WITH DIFFERENTIAL/PLATELET
BASOS ABS: 0.1 10*3/uL (ref 0.0–0.1)
BASOS PCT: 1 %
EOS PCT: 1 %
Eosinophils Absolute: 0.1 10*3/uL (ref 0.0–0.7)
HEMATOCRIT: 25.6 % — AB (ref 36.0–46.0)
Hemoglobin: 9 g/dL — ABNORMAL LOW (ref 12.0–15.0)
LYMPHS ABS: 2.5 10*3/uL (ref 0.7–4.0)
LYMPHS PCT: 19 %
MCH: 38 pg — ABNORMAL HIGH (ref 26.0–34.0)
MCHC: 35.2 g/dL (ref 30.0–36.0)
MCV: 108 fL — AB (ref 78.0–100.0)
MONO ABS: 1.4 10*3/uL — AB (ref 0.1–1.0)
Monocytes Relative: 11 %
NEUTROS PCT: 68 %
Neutro Abs: 9 10*3/uL — ABNORMAL HIGH (ref 1.7–7.7)
PLATELETS: 388 10*3/uL (ref 150–400)
RBC: 2.37 MIL/uL — AB (ref 3.87–5.11)
RDW: 18.6 % — AB (ref 11.5–15.5)
WBC: 13.1 10*3/uL — AB (ref 4.0–10.5)
nRBC: 21 /100 WBC — ABNORMAL HIGH

## 2015-06-11 LAB — RETICULOCYTES: RBC.: 2.37 MIL/uL — AB (ref 3.87–5.11)

## 2015-06-11 LAB — COMPREHENSIVE METABOLIC PANEL
ALBUMIN: 3.3 g/dL — AB (ref 3.5–5.0)
ALK PHOS: 84 U/L (ref 38–126)
ALT: 16 U/L (ref 14–54)
ANION GAP: 8 (ref 5–15)
AST: 42 U/L — ABNORMAL HIGH (ref 15–41)
BILIRUBIN TOTAL: 3.1 mg/dL — AB (ref 0.3–1.2)
BUN: 5 mg/dL — ABNORMAL LOW (ref 6–20)
CALCIUM: 8.7 mg/dL — AB (ref 8.9–10.3)
CO2: 21 mmol/L — ABNORMAL LOW (ref 22–32)
Chloride: 108 mmol/L (ref 101–111)
Creatinine, Ser: 0.3 mg/dL — ABNORMAL LOW (ref 0.44–1.00)
GFR calc non Af Amer: >60 mL/min (ref >60)
Glucose, Bld: 79 mg/dL (ref 65–99)
POTASSIUM: 3.6 mmol/L (ref 3.5–5.1)
Sodium: 137 mmol/L (ref 135–145)
TOTAL PROTEIN: 7.2 g/dL (ref 6.5–8.1)

## 2015-06-11 LAB — PROTIME-INR
INR: 1.04 (ref 0.00–1.49)
Prothrombin Time: 13.8 seconds (ref 11.6–15.2)

## 2015-06-11 MED ORDER — HYDROMORPHONE 1 MG/ML IV SOLN
INTRAVENOUS | Status: DC
  Administered 2015-06-11: 3.6 mg via INTRAVENOUS
  Administered 2015-06-11: 1.8 mg via INTRAVENOUS
  Administered 2015-06-11: 13:00:00 via INTRAVENOUS
  Administered 2015-06-12: 0.9 mg via INTRAVENOUS
  Administered 2015-06-12: 0 mg via INTRAVENOUS
  Administered 2015-06-12: 2.1 mg via INTRAVENOUS
  Filled 2015-06-11: qty 25

## 2015-06-11 MED ORDER — POLYETHYLENE GLYCOL 3350 17 G PO PACK
17.0000 g | PACK | Freq: Every day | ORAL | Status: DC | PRN
  Administered 2015-06-14: 17 g via ORAL
  Filled 2015-06-11: qty 1

## 2015-06-11 MED ORDER — NALOXONE HCL 0.4 MG/ML IJ SOLN: 0.4000 mg | INTRAMUSCULAR | Status: DC | PRN

## 2015-06-11 MED ORDER — PRENATAL 27-0.8 MG PO TABS
1.0000 | ORAL_TABLET | Freq: Every day | ORAL | Status: DC
  Administered 2015-06-11 – 2015-06-16 (×6): 1 via ORAL
  Filled 2015-06-11 (×6): qty 1

## 2015-06-11 MED ORDER — SODIUM CHLORIDE 0.9 % IJ SOLN: 9.0000 mL | INTRAMUSCULAR | Status: DC | PRN

## 2015-06-11 MED ORDER — ONDANSETRON HCL 4 MG/2ML IJ SOLN
4.0000 mg | Freq: Four times a day (QID) | INTRAMUSCULAR | Status: DC | PRN
  Administered 2015-06-11: 4 mg via INTRAVENOUS
  Filled 2015-06-11: qty 2

## 2015-06-11 MED ORDER — METOCLOPRAMIDE HCL 5 MG/ML IJ SOLN
10.0000 mg | Freq: Once | INTRAMUSCULAR | Status: AC
  Administered 2015-06-11: 10 mg via INTRAVENOUS
  Filled 2015-06-11: qty 2

## 2015-06-11 MED ORDER — FOLIC ACID 1 MG PO TABS
2.0000 mg | ORAL_TABLET | Freq: Every day | ORAL | Status: DC
  Administered 2015-06-11 – 2015-06-16 (×6): 2 mg via ORAL
  Filled 2015-06-11 (×7): qty 2

## 2015-06-11 MED ORDER — HYDROMORPHONE HCL 1 MG/ML IJ SOLN
1.0000 mg | INTRAMUSCULAR | Status: DC | PRN
  Administered 2015-06-11 (×2): 1 mg via INTRAVENOUS
  Filled 2015-06-11 (×2): qty 1

## 2015-06-11 MED ORDER — ENOXAPARIN SODIUM 40 MG/0.4ML ~~LOC~~ SOLN
40.0000 mg | SUBCUTANEOUS | Status: DC
  Administered 2015-06-11 – 2015-06-15 (×5): 40 mg via SUBCUTANEOUS
  Filled 2015-06-11 (×5): qty 0.4

## 2015-06-11 MED ORDER — SODIUM CHLORIDE 0.45 % IV SOLN
INTRAVENOUS | Status: DC
  Administered 2015-06-11 – 2015-06-14 (×6): via INTRAVENOUS

## 2015-06-11 MED ORDER — SENNOSIDES-DOCUSATE SODIUM 8.6-50 MG PO TABS
1.0000 | ORAL_TABLET | Freq: Two times a day (BID) | ORAL | Status: DC
  Administered 2015-06-11 – 2015-06-16 (×10): 1 via ORAL
  Filled 2015-06-11 (×10): qty 1

## 2015-06-11 NOTE — ED Notes (Signed)
OB Rapid response nurse in with patient.

## 2015-06-11 NOTE — Progress Notes (Signed)
RROB in to see pt at 27weeks with sickle cell pain. Pt reports no contractions, no vaginal bleeding, no leaking of fluid, and positive fetal movement.

## 2015-06-11 NOTE — Progress Notes (Signed)
Called ED for report,unable  To get nurse.

## 2015-06-11 NOTE — ED Notes (Signed)
Pt states that she has been having SCC pain in various areas of her body (back, legs) since Friday.  States that she was able to manage this pain on her own.  But when she started to get chest pain this morning around 0600.  States that this worries her.  [redacted] wks pregnant.  Denies any problems with the baby.

## 2015-06-11 NOTE — ED Notes (Signed)
The fetal heart rate strip on paper from 0900-0920; RROB called IT to connect to obix to capture strip.

## 2015-06-11 NOTE — H&P (Signed)
Hospital Admission Note Date: 06/11/2015  Patient name: [facility] record number: [id number] Date of birth: 24-May-1991 Age: 24 y.o. Gender: female PCP: Sabino Snipes, PA-C  Attending physician: [name], MD  Chief Complaint:Pain in back and chest wall x 5 days  History of Present Illness: This is an opiate naive patient with HPFH who usually receives her care for SCD at [facility]. She has an average of 4 hospitalizations for crisis/year and was last hospitalized in 01/2015. Pt si [redacted] weeks pregnant and follows with the [facility].  Pt reports that she started having pain in her back and legs about 5 days ago which she managed with Tylenol. However the pain worsened and then developed in the posterior chest wall and then anterior chest wall. The tylenol was no longer effective thus she presented to the hospital. Her baseline pain is 0/10 and upon arrival to the ED her pain was 8/10. After 2 doses of IV Dilaudid the pain decreased to about 5/10 and is now starting to increase again.  She denies any F/C/N/V/D. She denies headaches, SOB, DOE or any focal weakness.  I am asked to admit her for acute sickle cell crisis  I have spoken with Clenton Pare PA-C at [facility] and he confirms that her baseline Hb is 9.0, WBC 11K and reticulocytes 12%.   Scheduled Meds:  Continuous Infusions: . sodium chloride 125 mL/hr at 06/11/15 0905   PRN Meds:.HYDROmorphone (DILAUDID) injection Allergies: Ceftin Past Medical History  Diagnosis Date  . Sickle cell anemia (HCC)   . Sickle cell anemia ([place]) 1996   Past Surgical History  Procedure Laterality Date  . Cholecystectomy  2007   Family History  Problem Relation Age of Onset  . Sickle cell anemia Sister   . Diabetes Paternal Grandmother    Social History   Social History  . Marital Status: Single    Spouse Name: N/A  . Number of Children: N/A  . Years of Education: N/A    Occupational History  . Not on file.   Social History Main Topics  . Smoking status: Never Smoker   . Smokeless tobacco: Not on file  . Alcohol Use: No  . Drug Use: No  . Sexual Activity: Yes    Birth Control/ Protection: None   Other Topics Concern  . Not on file   Social History Narrative   Review of Systems: A comprehensive review of systems was negative except as noted in HPI. Physical Exam: No intake or output data in the 24 hours ending 06/11/15 1200 General: Alert, awake, oriented x3, in no acute distress.  HEENT: [place]/AT PEERL, EOMI, anicteric Neck: Trachea midline,  no masses, no thyromegal,y no JVD, no carotid bruit OROPHARYNX:  Moist, No exudate/ erythema/lesions.  Heart: Regular rate and rhythm, without murmurs, rubs, gallops, PMI non-displaced, no heaves or thrills on palpation.  Lungs: Clear to auscultation, no wheezing or rhonchi noted. No increased vocal fremitus resonant to percussion  Abdomen: Gravid abdomen Neuro: No focal neurological deficits noted cranial nerves II through XII grossly intact.  Strength at functional baseline in bilateral upper and lower extremities. Musculoskeletal: No warm swelling or erythema around joints, no spinal tenderness noted. Psychiatric: Patient alert and oriented x3, good insight and cognition, good recent to remote recall.   Lab results:  Recent Labs  06/11/15 0846  NA 137  K 3.6  CL 108  CO2 21*  GLUCOSE 79  BUN 5*  CREATININE 0.30*  CALCIUM 8.7*    Recent Labs  06/11/15 0846  AST 42*  ALT 16  ALKPHOS 84  BILITOT 3.1*  PROT 7.2  ALBUMIN 3.3*   No results for input(s): LIPASE, AMYLASE in the last 72 hours.  Recent Labs  06/11/15 0846  WBC 13.1*  NEUTROABS 9.0*  HGB 9.0*  HCT 25.6*  MCV 108.0*  PLT 388   No results for input(s): CKTOTAL, CKMB, CKMBINDEX, TROPONINI in the last 72 hours. Invalid input(s): POCBNP No results for input(s): DDIMER in the last 72 hours. No results for input(s):  HGBA1C in the last 72 hours. No results for input(s): CHOL, HDL, LDLCALC, TRIG, CHOLHDL, LDLDIRECT in the last 72 hours. No results for input(s): TSH, T4TOTAL, T3FREE, THYROIDAB in the last 72 hours.  Invalid input(s): FREET3  Recent Labs  06/11/15 0846  RETICCTPCT >23.0*   Imaging results:  Dg Chest 2 View  06/11/2015  CLINICAL DATA:  24 year old female with sickle cell disease, and pregnant in the third trimester. Sudden onset of chest pain this morning. Initial encounter. EXAM: CHEST  2 VIEW COMPARISON:  02/06/2015. FINDINGS: Cardiac size is stable at the upper limits of normal. Other mediastinal contours are within normal limits. Visualized tracheal air column is within normal limits. No pneumothorax, pulmonary edema, pleural effusion or confluent pulmonary opacity. Stable visualized osseous structures. Stable cholecystectomy clips. IMPRESSION: No acute cardiopulmonary abnormality. Electronically Signed   By: Genevie Ann M.D.   On: 06/11/2015 08:57   [place] Mfm Ob Follow Up  05/23/2015  OBSTETRICAL ULTRASOUND: This exam was performed within a [place] [facility]. The OB US report was generated in the AS system, and faxed to the ordering physician.  This report is available in the [facility]. See the AS Obstetric US report via the Image Link.  Assessment adn Plan: 1. Hb SS with HPFH: Will treat with regular PCA and consider clinician assisted doses based on patients response to therapy. Continue IVF-D5.45 at 100 ml.hr. 2. Intrauterine Prenancy: Pt is gestational age [redacted] weeks, She has been examined by the Ob-Rapid response team and found stable for admission to [facility]. Will check fetal tones and Ob-Rapid response team available for questions and assistance. 3. Mild Leukocytosis: likely secondary to crisis.  Time spent 60 minutes.   Jeancarlo Leffler A. 06/11/2015, 12:00 PM

## 2015-06-11 NOTE — ED Provider Notes (Signed)
CSN: [id number]     Arrival date & time 06/11/15  0759 History   First MD Initiated Contact with Patient 06/11/15 0805     Chief Complaint  Patient presents with  . Sickle Cell Pain Crisis    HPI Comments: The patient has history of sickle cell disease.  She started to have pain in her back and upper arms and legs on Friday. The symptoms would come and go and migrate to different locations. Patient is [redacted] weeks pregnant so she was trying to manage her pain with Tylenol. She was managing to do so but this morning she started developing pain in her chest so she came into the emergency room. She denies any trouble with any shortness of breath or fevers. Patient denies any issues with vaginal bleeding or abdominal pain.  Patient is a 24 y.o. female presenting with sickle cell pain.  Sickle Cell Pain Crisis Location:  Upper extremity, chest and back Severity:  Moderate Onset quality:  Gradual Duration:  3 days Similar to previous crisis episodes: yes   Timing:  Constant Progression:  Worsening Chronicity:  Recurrent Context: pregnancy   Context: not change in medication and not cold exposure   Relieved by:  Nothing Ineffective treatments:  OTC medications Associated symptoms: chest pain   Associated symptoms: no cough, no fatigue, no fever, no nausea, no shortness of breath and no vomiting     Past Medical History  Diagnosis Date  . Sickle cell anemia (HCC)   . Sickle cell anemia ([place]) 1996   Past Surgical History  Procedure Laterality Date  . Cholecystectomy  2007   Family History  Problem Relation Age of Onset  . Sickle cell anemia Sister   . Diabetes Paternal Grandmother    Social History  Substance Use Topics  . Smoking status: Never Smoker   . Smokeless tobacco: None  . Alcohol Use: No   OB History    Gravida Para Term Preterm AB TAB SAB Ectopic Multiple Living   1              Review of Systems  Constitutional: Negative for fever and fatigue.  Respiratory:  Negative for cough and shortness of breath.   Cardiovascular: Positive for chest pain.  Gastrointestinal: Negative for nausea and vomiting.  All other systems reviewed and are negative.     Allergies  Ceftin  Home Medications   Prior to Admission medications   Medication Sig Start Date End Date Taking? Authorizing Provider  acetaminophen (TYLENOL) 500 MG tablet Take 1,000 mg by mouth every 6 (six) hours as needed for mild pain.   Yes Historical Provider, MD  Prenatal Vit-Fe Fumarate-FA (MULTIVITAMIN-PRENATAL) 27-0.8 MG TABS tablet Take 1 tablet by mouth daily.    Yes Historical Provider, MD   BP 122/66 mmHg  Pulse 84  Temp(Src) 98 F (36.7 C) (Oral)  Resp 18  Wt 117 lb (53.071 kg)  SpO2 95%  LMP 12/04/2014 Physical Exam  Constitutional: She appears well-developed and well-nourished. No distress.  HENT:  Head: Normocephalic and atraumatic.  Right Ear: External ear normal.  Left Ear: External ear normal.  Eyes: Conjunctivae are normal. Right eye exhibits no discharge. Left eye exhibits no discharge. No scleral icterus.  Neck: Neck supple. No tracheal deviation present.  Cardiovascular: Normal rate, regular rhythm and intact distal pulses.   Pulmonary/Chest: Effort normal and breath sounds normal. No stridor. No respiratory distress. She has no wheezes. She has no rales.  Abdominal: Soft. Bowel sounds are normal. She  exhibits no distension. There is no tenderness. There is no rebound and no guarding.  Gravid uterus, no tenderness  Musculoskeletal: She exhibits tenderness (diffusely in the extremities). She exhibits no edema.  Neurological: She is alert. She has normal strength. No cranial nerve deficit (no facial droop, extraocular movements intact, no slurred speech) or sensory deficit. She exhibits normal muscle tone. She displays no seizure activity. Coordination normal.  Skin: Skin is warm and dry. No rash noted.  Psychiatric: She has a normal mood and affect.  Nursing note  and vitals reviewed.   ED Course  Procedures (including critical care time) Labs Review Labs Reviewed  CBC WITH DIFFERENTIAL/PLATELET - Abnormal; Notable for the following:    WBC 13.1 (*)    RBC 2.37 (*)    Hemoglobin 9.0 (*)    HCT 25.6 (*)    MCV 108.0 (*)    MCH 38.0 (*)    RDW 18.6 (*)    All other components within normal limits  RETICULOCYTES - Abnormal; Notable for the following:    Retic Ct Pct >23.0 (*)    RBC. 2.37 (*)    All other components within normal limits  COMPREHENSIVE METABOLIC PANEL - Abnormal; Notable for the following:    CO2 21 (*)    BUN 5 (*)    Creatinine, Ser 0.30 (*)    Calcium 8.7 (*)    Albumin 3.3 (*)    AST 42 (*)    Total Bilirubin 3.1 (*)    All other components within normal limits  PROTIME-INR    Imaging Review Dg Chest 2 View  06/11/2015  CLINICAL DATA:  24 year old female with sickle cell disease, and pregnant in the third trimester. Sudden onset of chest pain this morning. Initial encounter. EXAM: CHEST  2 VIEW COMPARISON:  02/06/2015. FINDINGS: Cardiac size is stable at the upper limits of normal. Other mediastinal contours are within normal limits. Visualized tracheal air column is within normal limits. No pneumothorax, pulmonary edema, pleural effusion or confluent pulmonary opacity. Stable visualized osseous structures. Stable cholecystectomy clips. IMPRESSION: No acute cardiopulmonary abnormality. Electronically Signed   By: Genevie Ann M.D.   On: 06/11/2015 08:57   I have personally reviewed and evaluated these images and lab results as part of my medical decision-making.  EKG Normal sinus rhythm rate 83 Normal access, normal intervals Normal ST-T wave No prior EKG for comparison  Medications  0.45 % sodium chloride infusion ( Intravenous New Bag/Given 06/11/15 0905)  HYDROmorphone (DILAUDID) injection 1 mg (1 mg Intravenous Given 06/11/15 0938)  metoCLOPramide (REGLAN) injection 10 mg (10 mg Intravenous Given 06/11/15 0912)     MDM   Final diagnoses:  Sickle cell pain crisis ([place])    The patient's symptoms are consistent with a sickle cell crisis.  Patient has a stable anemia. Reticulocyte count is elevated consistent with a sickle cell crisis.  Chest x-ray does not show any infiltrate. Patient is not hypoxic. Doubt acute chest syndrome.  Patient had fetal monitoring while she is in the emergency department. The rapid response team kindly assessed the patient. From pregnancy standpoint, the patient is stable.  Patient's symptoms are persistent despite treatment in the emergency department. I'll consult with the medical service regarding admission for sickle cell pain crisis.    Dorie Rank, MD 06/11/15 [phone]

## 2015-06-11 NOTE — ED Notes (Signed)
Attempted to call report. Receiving nurse not able to receive report.

## 2015-06-12 MED ORDER — DIPHENHYDRAMINE HCL 25 MG PO CAPS: 25.0000 mg | ORAL_CAPSULE | Freq: Four times a day (QID) | ORAL | Status: DC | PRN

## 2015-06-12 MED ORDER — HYDROMORPHONE HCL 1 MG/ML IJ SOLN
1.0000 mg | INTRAMUSCULAR | Status: DC | PRN
  Administered 2015-06-12 – 2015-06-13 (×7): 1 mg via INTRAVENOUS
  Filled 2015-06-12 (×7): qty 1

## 2015-06-12 MED ORDER — OXYCODONE HCL 5 MG PO TABS
5.0000 mg | ORAL_TABLET | ORAL | Status: DC
  Administered 2015-06-12 – 2015-06-15 (×13): 5 mg via ORAL
  Filled 2015-06-12 (×17): qty 1

## 2015-06-12 MED ORDER — ACETAMINOPHEN 325 MG PO TABS
650.0000 mg | ORAL_TABLET | Freq: Four times a day (QID) | ORAL | Status: DC | PRN
  Administered 2015-06-12: 650 mg via ORAL
  Filled 2015-06-12: qty 2

## 2015-06-12 NOTE — Progress Notes (Signed)
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 Hb SS with HPFH with crisis: Pt reports that pain improved since yesterday, but not getting adequate control on PCA as dose not available throughout entire hour. She has used 2.4 mg with 9/8:demands/deliveries in last 11 hours. I will schedule her Oxycodone 5 mg and make available IV Dilaudid by IV for breakthrough pain. Anticipate she will be eady for discharge home tomorrow. 2. Leukocytosis: Pt had ild leukocytosis without evidence of infection. Will re-evaluate tomorrow. I suspect that this is secondary to crisis. 3. Anemia of chronic disease: Pt has a baseline Hb of 9 per report of Clenton Pare who see patient at [facility]. Currently it is a t baseline. 4. Chronic pain: Pt normally takes Oxycodone for pain on a  PRN basis bit has only been using Tylenol since her pregnancy.  Code Status: Full Code Family Communication: N/A Disposition Plan: Not yet ready for discharge  [place].  Pager [phone]. If 7PM-7AM, please contact night-coverage.  06/12/2015, 11:27 AM  LOS: 1 day    Consultants:  None  Procedures:  None  Antibiotics:  None  HPI/Subjective: Pt states that she still has pain in her legs at an intensity of 6/10. The pain in her chest wall has resolved.  Objective: Filed Vitals:   06/12/15 0538 06/12/15 0602 06/12/15 0802 06/12/15 0954  BP:  96/50  103/61  Pulse:  79  86  Temp:  98.3 F (36.8 C)  97.8 F (36.6 C)  TempSrc:  Oral  Oral  Resp: 20 20 20 20   Height:      Weight:      SpO2: 92% 91% 92% 92%   Weight change:   Intake/Output Summary (Last 24 hours) at 06/12/15 1127 Last data filed at  06/12/15 1002  Gross per 24 hour  Intake    960 ml  Output    900 ml  Net     60 ml    General: Alert, awake, oriented x3, in no acute distress.  HEENT: [place]/AT PEERL, EOMI, aniciteric Neck: Trachea midline,  no masses, no thyromegal,y no JVD, no carotid bruit OROPHARYNX:  Moist, No exudate/ erythema/lesions.  Heart: Regular rate and rhythm, without murmurs, rubs, gallops, PMI non-displaced, no heaves or thrills on palpation.  Lungs: Clear to auscultation, no wheezing or rhonchi noted. No increased vocal fremitus resonant to percussion  Abdomen: Soft, nontender, nondistended, positive bowel sounds, no masses no hepatosplenomegaly noted..  Neuro: No focal neurological deficits noted cranial nerves II through XII grossly intact.  Strength at functional baseline in bilateral upper and lower extremities. Musculoskeletal: No warm swelling or erythema around joints, no spinal tenderness noted.    Data Reviewed: Basic Metabolic Panel:  Recent Labs Lab 06/11/15 0846  NA 137  K 3.6  CL 108  CO2 21*  GLUCOSE 79  BUN 5*  CREATININE 0.30*  CALCIUM 8.7*   Liver Function Tests:  Recent Labs Lab 06/11/15 0846  AST 42*  ALT 16  ALKPHOS 84  BILITOT 3.1*  PROT 7.2  ALBUMIN 3.3*   No results for input(s): LIPASE, AMYLASE in the last 168 hours. No results for input(s): AMMONIA in the last 168 hours. CBC:  Recent Labs Lab 06/11/15 0846  WBC  13.1*  NEUTROABS 9.0*  HGB 9.0*  HCT 25.6*  MCV 108.0*  PLT 388   Cardiac Enzymes: No results for input(s): CKTOTAL, CKMB, CKMBINDEX, TROPONINI in the last 168 hours. BNP (last 3 results) No results for input(s): BNP in the last 8760 hours.  ProBNP (last 3 results) No results for input(s): PROBNP in the last 8760 hours.  CBG: No results for input(s): GLUCAP in the last 168 hours.  No results found for this or any previous visit (from the past 240 hour(s)).   Studies: Dg Chest 2 View  06/11/2015  CLINICAL DATA:  24 year old  female with sickle cell disease, and pregnant in the third trimester. Sudden onset of chest pain this morning. Initial encounter. EXAM: CHEST  2 VIEW COMPARISON:  02/06/2015. FINDINGS: Cardiac size is stable at the upper limits of normal. Other mediastinal contours are within normal limits. Visualized tracheal air column is within normal limits. No pneumothorax, pulmonary edema, pleural effusion or confluent pulmonary opacity. Stable visualized osseous structures. Stable cholecystectomy clips. IMPRESSION: No acute cardiopulmonary abnormality. Electronically Signed   By: Genevie Ann M.D.   On: 06/11/2015 08:57   [place] Mfm Ob Follow Up  05/23/2015  OBSTETRICAL ULTRASOUND: This exam was performed within a [place] [facility] Department. The OB US report was generated in the AS system, and faxed to the ordering physician.  This report is available in the [facility]. See the AS Obstetric US report via the Image Link.   Scheduled Meds: . enoxaparin (LOVENOX) injection  40 mg Subcutaneous Q24H  . folic acid  2 mg Oral Daily  . multivitamin-prenatal  1 tablet Oral Daily  . oxyCODONE  5 mg Oral Q4H  . senna-docusate  1 tablet Oral BID   Continuous Infusions: . sodium chloride 125 mL/hr at 06/12/15 0049    Time spent 30 minutes.

## 2015-06-12 NOTE — Progress Notes (Signed)
RROB in to monitor fhr; fhr reassurring with baseline 130, moderate variability, 10x10 accels, with some slight uterine irritability. Pt reports no vaginal bleeding, no leaking of fluid, no contractions; positive fetal movement.

## 2015-06-13 DIAGNOSIS — K5909 Other constipation: Secondary | ICD-10-CM

## 2015-06-13 LAB — BASIC METABOLIC PANEL
Anion gap: 5 (ref 5–15)
CO2: 23 mmol/L (ref 22–32)
Calcium: 8.3 mg/dL — ABNORMAL LOW (ref 8.9–10.3)
Chloride: 110 mmol/L (ref 101–111)
Creatinine, Ser: 0.31 mg/dL — ABNORMAL LOW (ref 0.44–1.00)
GFR calc Af Amer: >60 mL/min (ref >60)
GLUCOSE: 88 mg/dL (ref 65–99)
POTASSIUM: 3.9 mmol/L (ref 3.5–5.1)
Sodium: 138 mmol/L (ref 135–145)

## 2015-06-13 LAB — CBC WITH DIFFERENTIAL/PLATELET
BASOS ABS: 0.1 10*3/uL (ref 0.0–0.1)
Basophils Relative: 1 %
EOS ABS: 0.1 10*3/uL (ref 0.0–0.7)
Eosinophils Relative: 1 %
HCT: 20.7 % — ABNORMAL LOW (ref 36.0–46.0)
HEMOGLOBIN: 7.3 g/dL — AB (ref 12.0–15.0)
LYMPHS PCT: 19 %
Lymphs Abs: 2.1 10*3/uL (ref 0.7–4.0)
MCH: 37.8 pg — AB (ref 26.0–34.0)
MCHC: 35.3 g/dL (ref 30.0–36.0)
MCV: 107.3 fL — ABNORMAL HIGH (ref 78.0–100.0)
MONO ABS: 1.2 10*3/uL — AB (ref 0.1–1.0)
Monocytes Relative: 11 %
NEUTROS PCT: 68 %
Neutro Abs: 7.4 10*3/uL (ref 1.7–7.7)
PLATELETS: 316 10*3/uL (ref 150–400)
RBC: 1.93 MIL/uL — AB (ref 3.87–5.11)
RDW: 20.6 % — AB (ref 11.5–15.5)
WBC: 10.9 10*3/uL — AB (ref 4.0–10.5)

## 2015-06-13 MED ORDER — NALOXONE HCL 0.4 MG/ML IJ SOLN: 0.4000 mg | INTRAMUSCULAR | Status: DC | PRN

## 2015-06-13 MED ORDER — ONDANSETRON HCL 4 MG/2ML IJ SOLN
4.0000 mg | Freq: Four times a day (QID) | INTRAMUSCULAR | Status: DC | PRN
  Administered 2015-06-14: 4 mg via INTRAVENOUS
  Filled 2015-06-13: qty 2

## 2015-06-13 MED ORDER — INFLUENZA VAC SPLIT QUAD 0.5 ML IM SUSY
0.5000 mL | PREFILLED_SYRINGE | INTRAMUSCULAR | Status: AC
  Administered 2015-06-14: 0.5 mL via INTRAMUSCULAR
  Filled 2015-06-13 (×3): qty 0.5

## 2015-06-13 MED ORDER — HYDROMORPHONE 1 MG/ML IV SOLN
INTRAVENOUS | Status: DC
  Administered 2015-06-13: 13:00:00 via INTRAVENOUS
  Administered 2015-06-13: 4.8 mg via INTRAVENOUS
  Administered 2015-06-13: 1.8 mg via INTRAVENOUS
  Administered 2015-06-14: 3 mg via INTRAVENOUS
  Administered 2015-06-14: 3.7 mg via INTRAVENOUS
  Administered 2015-06-14: 3.5 mg via INTRAVENOUS
  Administered 2015-06-14: 0.6 mg via INTRAVENOUS
  Administered 2015-06-14: 6 mg via INTRAVENOUS
  Administered 2015-06-14: 19:00:00 via INTRAVENOUS
  Administered 2015-06-14: 2.4 mg via INTRAVENOUS
  Administered 2015-06-15: 2.39 mg via INTRAVENOUS
  Administered 2015-06-15: 1 mg via INTRAVENOUS
  Administered 2015-06-15: 6 mg via INTRAVENOUS
  Administered 2015-06-15: 4.12 mg via INTRAVENOUS
  Administered 2015-06-15: 1.2 mg via INTRAVENOUS
  Administered 2015-06-15: 4.2 mg via INTRAVENOUS
  Administered 2015-06-15: 1.8 mg via INTRAVENOUS
  Administered 2015-06-15: 4 mg via INTRAVENOUS
  Administered 2015-06-16: 0.6 mg via INTRAVENOUS
  Administered 2015-06-16 (×2): 1.8 mg via INTRAVENOUS
  Filled 2015-06-13 (×3): qty 25

## 2015-06-13 MED ORDER — SODIUM CHLORIDE 0.9 % IJ SOLN: 9.0000 mL | INTRAMUSCULAR | Status: DC | PRN

## 2015-06-13 NOTE — Progress Notes (Signed)
Patient does not feel UI, frequent fetal movements palpated, patient denies vaginal bleeding or LOF.

## 2015-06-13 NOTE — Progress Notes (Addendum)
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 Hb SS with HPFH with crisis: Pt reports that pain has increased overnight and is now at 8/10 localized to her back. Will continue Oxycodone and restart PCA for better control. Will re-assess tomorrow. 2. Constipation: Miralax ordered. Will ask nursing to administer. 3. Leukocytosis: Pt had mild leukocytosis without evidence of infection. Will re-evaluate tomorrow. I suspect that this is secondary to crisis. 4. Anemia of chronic disease: Pt has a baseline Hb of 9 per report of Clenton Pare who see patient at [facility]. Currently it is a t baseline. 5. Chronic pain: Pt normally takes Oxycodone for pain on a  PRN basis bit has only been using Tylenol since her pregnancy.  Code Status: Full Code Family Communication: N/A Disposition Plan: Not yet ready for discharge  [place].  Pager [phone]. If 7PM-7AM, please contact night-coverage.  06/13/2015, 10:36 AM  LOS: 2 days    Consultants:  None  Procedures:  None  Antibiotics:  None  HPI/Subjective: Pt states that she still has pain in her back at an intensity of 8/10. The pain in her chest wall has resolved. Has not had a BM in 3 days.  Objective: Filed Vitals:   06/13/15 0240 06/13/15 0659 06/13/15 0954 06/13/15 1008  BP: 104/56 109/56 104/61   Pulse: 84 80 92   Temp: 97.6 F (36.4 C) 98.3 F (36.8 C) 97.8 F (36.6 C)   TempSrc: Oral Oral Oral   Resp: 20 20 19    Height:      Weight:    128 lb 6.4 oz (58.242 kg)  SpO2: 93% 90% 91%    Weight change:   Intake/Output Summary (Last 24 hours) at 06/13/15 1036 Last data filed at 06/13/15 0955  Gross per 24 hour   Intake   1280 ml  Output   3250 ml  Net  -1970 ml    General: Alert, awake, oriented x3, in no mild distress secondary to pain.  HEENT: [place]/AT PEERL, EOMI, aniciteric Neck: Trachea midline,  no masses, no thyromegal,y no JVD, no carotid bruit OROPHARYNX:  Moist, No exudate/ erythema/lesions.  Heart: Regular rate and rhythm, without murmurs, rubs, gallops, PMI non-displaced, no heaves or thrills on palpation.  Lungs: Clear to auscultation, no wheezing or rhonchi noted. No increased vocal fremitus resonant to percussion  Abdomen: Soft, nontender, nondistended, positive bowel sounds, no masses no hepatosplenomegaly noted.  Neuro: No focal neurological deficits noted cranial nerves II through XII grossly intact.  Strength at functional baseline in bilateral upper and lower extremities. Musculoskeletal: No warm swelling or erythema around joints, no spinal tenderness noted.    Data Reviewed: Basic Metabolic Panel:  Recent Labs Lab 06/11/15 0846 06/13/15 0410  NA 137 138  K 3.6 3.9  CL 108 110  CO2 21* 23  GLUCOSE 79 88  BUN 5* <5*  CREATININE 0.30* 0.31*  CALCIUM 8.7* 8.3*   Liver Function Tests:  Recent Labs Lab 06/11/15 0846  AST 42*  ALT 16  ALKPHOS 84  BILITOT 3.1*  PROT 7.2  ALBUMIN 3.3*   No results for input(s): LIPASE, AMYLASE in the last 168 hours. No results for input(s): AMMONIA in the last 168 hours. CBC:  Recent Labs Lab 06/11/15  7858 06/13/15 0410  WBC 13.1* 10.9*  NEUTROABS 9.0* 7.4  HGB 9.0* 7.3*  HCT 25.6* 20.7*  MCV 108.0* 107.3*  PLT 388 316   Cardiac Enzymes: No results for input(s): CKTOTAL, CKMB, CKMBINDEX, TROPONINI in the last 168 hours. BNP (last 3 results) No results for input(s): BNP in the last 8760 hours.  ProBNP (last 3 results) No results for input(s): PROBNP in the last 8760 hours.  CBG: No results for input(s): GLUCAP in the last 168 hours.  No results found for this or any previous visit (from the past 240 hour(s)).    Studies: Dg Chest 2 View  06/11/2015  CLINICAL DATA:  24 year old female with sickle cell disease, and pregnant in the third trimester. Sudden onset of chest pain this morning. Initial encounter. EXAM: CHEST  2 VIEW COMPARISON:  02/06/2015. FINDINGS: Cardiac size is stable at the upper limits of normal. Other mediastinal contours are within normal limits. Visualized tracheal air column is within normal limits. No pneumothorax, pulmonary edema, pleural effusion or confluent pulmonary opacity. Stable visualized osseous structures. Stable cholecystectomy clips. IMPRESSION: No acute cardiopulmonary abnormality. Electronically Signed   By: Genevie Ann M.D.   On: 06/11/2015 08:57   [place] Mfm Ob Follow Up  05/23/2015  OBSTETRICAL ULTRASOUND: This exam was performed within a [place] [facility] Department. The OB US report was generated in the AS system, and faxed to the ordering physician.  This report is available in the [facility]. See the [facility] Obstetric US report via the Image Link.   Scheduled Meds: . enoxaparin (LOVENOX) injection  40 mg Subcutaneous Q24H  . folic acid  2 mg Oral Daily  . HYDROmorphone   Intravenous 6 times per day  . [START ON 06/14/2015] Influenza vac split quadrivalent PF  0.5 mL Intramuscular Tomorrow-1000  . multivitamin-prenatal  1 tablet Oral Daily  . oxyCODONE  5 mg Oral Q4H  . senna-docusate  1 tablet Oral BID   Continuous Infusions: . sodium chloride 125 mL/hr at 06/13/15 0835    Time spent 25 minutes.

## 2015-06-14 NOTE — Progress Notes (Signed)
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 Hb SS with HPFH with crisis: Pt reports that pain control has improved with pain decrease to 5/10 and localized to back. Will continue Oxycodone and restart PCA for better control. Decrease IVF to [facility] rate. Anticipate discharge home tomorrow. 2. Constipation: Miralax ordered. Still has not had a BM and Miralax still not given. Spoke wuith nurse and Miralx to be given now. 3. Leukocytosis: Pt had mild leukocytosis without evidence of infection. Will re-evaluate tomorrow. I suspect that this is secondary to crisis. 4. Anemia of chronic disease: Pt has a baseline Hb of 9 per report of Clenton Pare who see patient at [facility]. Currently it is a t baseline. 5. Chronic pain: Pt normally takes Oxycodone for pain on a  PRN basis bit has only been using Tylenol since her pregnancy.  Code Status: Full Code Family Communication: N/A Disposition Plan: Not yet ready for discharge  [place].  Pager [phone]. If 7PM-7AM, please contact night-coverage.  06/14/2015, 11:32 AM  LOS: 3 days    Consultants:  None  Procedures:  None  Antibiotics:  None  HPI/Subjective: Pt states that she still has pain in her back at an intensity of 5/10. The pain in her chest wall has resolved. Has not had a BM in 4 days.  Objective: Filed Vitals:   06/14/15 0213 06/14/15 0400 06/14/15 0513 06/14/15 0714  BP: 108/61  110/60   Pulse: 79  83   Temp: 98.2 F (36.8 C)  98.8 F (37.1 C)   TempSrc: Oral  Oral   Resp: 27 15 26 24   Height:      Weight:   128 lb 12 oz (58.4 kg)   SpO2: 98% 98%  97%   Weight change:   Intake/Output Summary (Last 24  hours) at 06/14/15 1132 Last data filed at 06/13/15 1833  Gross per 24 hour  Intake    420 ml  Output    600 ml  Net   -180 ml    General: Alert, awake, oriented x3, in no acute distress today.  HEENT: [place]/AT PEERL, EOMI, aniciteric Neck: Trachea midline,  no masses, no thyromegal,y no JVD, no carotid bruit OROPHARYNX:  Moist, No exudate/ erythema/lesions.  Heart: Regular rate and rhythm, without murmurs, rubs, gallops, PMI non-displaced, no heaves or thrills on palpation.  Lungs: Clear to auscultation, no wheezing or rhonchi noted. No increased vocal fremitus resonant to percussion  Abdomen: Soft, nontender, nondistended, positive bowel sounds, no masses no hepatosplenomegaly noted.  Neuro: No focal neurological deficits noted cranial nerves II through XII grossly intact.  Strength at functional baseline in bilateral upper and lower extremities. Musculoskeletal: No warm swelling or erythema around joints, no spinal tenderness noted.    Data Reviewed: Basic Metabolic Panel:  Recent Labs Lab 06/11/15 0846 06/13/15 0410  NA 137 138  K 3.6 3.9  CL 108 110  CO2 21* 23  GLUCOSE 79 88  BUN 5* <5*  CREATININE 0.30* 0.31*  CALCIUM 8.7* 8.3*   Liver Function Tests:  Recent Labs Lab 06/11/15 0846  AST 42*  ALT 16  ALKPHOS 84  BILITOT 3.1*  PROT 7.2  ALBUMIN 3.3*   No results for input(s): LIPASE, AMYLASE in the  last 168 hours. No results for input(s): AMMONIA in the last 168 hours. CBC:  Recent Labs Lab 06/11/15 0846 06/13/15 0410  WBC 13.1* 10.9*  NEUTROABS 9.0* 7.4  HGB 9.0* 7.3*  HCT 25.6* 20.7*  MCV 108.0* 107.3*  PLT 388 316   Cardiac Enzymes: No results for input(s): CKTOTAL, CKMB, CKMBINDEX, TROPONINI in the last 168 hours. BNP (last 3 results) No results for input(s): BNP in the last 8760 hours.  ProBNP (last 3 results) No results for input(s): PROBNP in the last 8760 hours.  CBG: No results for input(s): GLUCAP in the last 168 hours.  No results  found for this or any previous visit (from the past 240 hour(s)).   Studies: Dg Chest 2 View  06/11/2015  CLINICAL DATA:  24 year old female with sickle cell disease, and pregnant in the third trimester. Sudden onset of chest pain this morning. Initial encounter. EXAM: CHEST  2 VIEW COMPARISON:  02/06/2015. FINDINGS: Cardiac size is stable at the upper limits of normal. Other mediastinal contours are within normal limits. Visualized tracheal air column is within normal limits. No pneumothorax, pulmonary edema, pleural effusion or confluent pulmonary opacity. Stable visualized osseous structures. Stable cholecystectomy clips. IMPRESSION: No acute cardiopulmonary abnormality. Electronically Signed   By: Genevie Ann M.D.   On: 06/11/2015 08:57   [place] Mfm Ob Follow Up  05/23/2015  OBSTETRICAL ULTRASOUND: This exam was performed within a [place] [facility]. The OB US report was generated in the AS system, and faxed to the ordering physician.  This report is available in the [facility]. See the AS Obstetric US report via the Image Link.   Scheduled Meds: . enoxaparin (LOVENOX) injection  40 mg Subcutaneous Q24H  . folic acid  2 mg Oral Daily  . HYDROmorphone   Intravenous 6 times per day  . multivitamin-prenatal  1 tablet Oral Daily  . oxyCODONE  5 mg Oral Q4H  . senna-docusate  1 tablet Oral BID   Continuous Infusions: . sodium chloride 125 mL/hr at 06/13/15 0835    Time spent 25 minutes.

## 2015-06-14 NOTE — Progress Notes (Signed)
Pt denies vaginal bleeding or leaking of fluid. No c/o cramping or uc's.

## 2015-06-15 NOTE — Progress Notes (Signed)
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 Hb SS with HPFH with crisis: Pt up walking but having significant throbbing pain to the low back to the point of tears. She has used 16 mg of IV Dilaudid via the PCA in addition to her oral analgesics in the last 24 hours.Will continue Oxycodone and restart PCA for better control. Decrease IVF to [facility] rate.Pt also to use heating pad to low back as adjunctive treatment. 2. Constipation: Miralax ordered. Still has not had a BM and Miralax still not given. Spoke wuith nurse and Miralx to be given now. 3. Leukocytosis: Pt had mild leukocytosis without evidence of infection. Will re-evaluate tomorrow. I suspect that this is secondary to crisis. 4. Anemia of chronic disease: Pt has a baseline Hb of 9 per report of Clenton Pare who see patient at [facility]. Currently it is a t baseline. 5. Chronic pain: Pt normally takes Oxycodone for pain on a  PRN basis bit has only been using Tylenol since her pregnancy.  Code Status: Full Code Family Communication: N/A Disposition Plan: Not yet ready for discharge  [place].  Pager [phone]. If 7PM-7AM, please contact night-coverage.  06/15/2015, 10:33 AM  LOS: 4 days    Consultants:  None  Procedures:  None  Antibiotics:  None  HPI/Subjective: Pt states that she still has pain in her back at an intensity of 7/10. The pain in her chest wall has resolved. Has not had a BM in 4 days.  Objective: Filed Vitals:   06/15/15 0420 06/15/15 0532 06/15/15 0800 06/15/15 1022  BP:  113/56    Pulse:  83    Temp:  98.8 F (37.1 C)    TempSrc:  Oral    Resp: 21 18 18    Height:      Weight:       SpO2: 94% 97% 97% 94%   Weight change:   Intake/Output Summary (Last 24 hours) at 06/15/15 1033 Last data filed at 06/15/15 0555  Gross per 24 hour  Intake 8104.7 ml  Output    800 ml  Net 7304.7 ml    General: Alert, awake, oriented x3, in mild acute distress today.  HEENT: [place]/AT PEERL, EOMI, aniciteric Neck: Trachea midline,  no masses, no thyromegal,y no JVD, no carotid bruit OROPHARYNX:  Moist, No exudate/ erythema/lesions.  Heart: Regular rate and rhythm, without murmurs, rubs, gallops, PMI non-displaced, no heaves or thrills on palpation.  Lungs: Clear to auscultation, no wheezing or rhonchi noted. No increased vocal fremitus resonant to percussion  Abdomen: Soft, nontender, nondistended, positive bowel sounds, no masses no hepatosplenomegaly noted.  Neuro: No focal neurological deficits noted cranial nerves II through XII grossly intact.  Strength at functional baseline in bilateral upper and lower extremities. Musculoskeletal: No warm swelling or erythema around joints, no spinal tenderness noted.    Data Reviewed: Basic Metabolic Panel:  Recent Labs Lab 06/11/15 0846 06/13/15 0410  NA 137 138  K 3.6 3.9  CL 108 110  CO2 21* 23  GLUCOSE 79 88  BUN 5* <5*  CREATININE 0.30* 0.31*  CALCIUM 8.7* 8.3*   Liver Function Tests:  Recent Labs Lab 06/11/15 0846  AST 42*  ALT 16  ALKPHOS 84  BILITOT 3.1*  PROT 7.2  ALBUMIN 3.3*   No results for input(s): LIPASE, AMYLASE in the last 168 hours. No results for input(s): AMMONIA in the last 168 hours. CBC:  Recent Labs Lab 06/11/15 0846 06/13/15 0410  WBC 13.1* 10.9*  NEUTROABS 9.0* 7.4  HGB 9.0* 7.3*  HCT 25.6* 20.7*  MCV 108.0* 107.3*  PLT 388 316   Cardiac Enzymes: No results for input(s): CKTOTAL, CKMB, CKMBINDEX, TROPONINI in the last 168 hours. BNP (last 3 results) No results for input(s): BNP in the last 8760 hours.  ProBNP (last 3 results) No results for input(s): PROBNP in the last 8760  hours.  CBG: No results for input(s): GLUCAP in the last 168 hours.  No results found for this or any previous visit (from the past 240 hour(s)).   Studies: Dg Chest 2 View  06/11/2015  CLINICAL DATA:  24 year old female with sickle cell disease, and pregnant in the third trimester. Sudden onset of chest pain this morning. Initial encounter. EXAM: CHEST  2 VIEW COMPARISON:  02/06/2015. FINDINGS: Cardiac size is stable at the upper limits of normal. Other mediastinal contours are within normal limits. Visualized tracheal air column is within normal limits. No pneumothorax, pulmonary edema, pleural effusion or confluent pulmonary opacity. Stable visualized osseous structures. Stable cholecystectomy clips. IMPRESSION: No acute cardiopulmonary abnormality. Electronically Signed   By: Genevie Ann M.D.   On: 06/11/2015 08:57   [place] Mfm Ob Follow Up  05/23/2015  OBSTETRICAL ULTRASOUND: This exam was performed within a [place] [facility]. The OB US report was generated in the AS system, and faxed to the ordering physician.  This report is available in the [facility]. See the AS Obstetric US report via the Image Link.   Scheduled Meds: . enoxaparin (LOVENOX) injection  40 mg Subcutaneous Q24H  . folic acid  2 mg Oral Daily  . HYDROmorphone   Intravenous 6 times per day  . multivitamin-prenatal  1 tablet Oral Daily  . oxyCODONE  5 mg Oral Q4H  . senna-docusate  1 tablet Oral BID   Continuous Infusions: . sodium chloride 10 mL/hr at 06/14/15 1757    Time spent 25 minutes.

## 2015-06-15 NOTE — Progress Notes (Signed)
No c/o vaginal bleeding, leaking of fluid or uc's.

## 2015-06-16 MED ORDER — OXYCODONE HCL 5 MG PO TABS: 5.0000 mg | ORAL_TABLET | ORAL | Status: DC

## 2015-06-16 MED ORDER — FOLIC ACID 1 MG PO TABS: 2.0000 mg | ORAL_TABLET | Freq: Every day | ORAL | Status: DC

## 2015-06-16 NOTE — Progress Notes (Signed)
Patient refused 0400 dose of Oxycontin. She stated " the dose I took earlier made me feel weird".

## 2015-06-16 NOTE — Progress Notes (Signed)
Patient discharged home, all discharge medications and instructions reviewed and questions answered. Patient to be assisted to vehicle by wheelchair.  

## 2015-06-16 NOTE — Discharge Summary (Signed)
Gabriella Griffin MRN: [id number] DOB/AGE: 1991-07-12 24 y.o.  Admit date: 06/11/2015 Discharge date: 06/16/2015  Primary Care Physician:  Sabino Snipes, PA-C   Discharge Diagnoses:   Patient Active Problem List   Diagnosis Date Noted  . Sickle cell crisis ([place]) 06/11/2015  . 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 Leukocytosis 06/11/2015  . Hereditary disease in family possibly affecting fetus, affecting management of mother, antepartum condition or complication 40/81/4481  . Sickle cell pain crisis ([place]) 02/07/2015  . Sickle cell anemia (HCC)   . Supervision of high-risk pregnancy 02/04/2015  . Maternal sickle cell anemia complicating pregnancy ([place]) 02/04/2015    DISCHARGE MEDICATION:   Medication List    TAKE these medications        acetaminophen 500 MG tablet  Commonly known as:  TYLENOL  Take 1,000 mg by mouth every 6 (six) hours as needed for mild pain.     folic acid 1 MG tablet  Commonly known as:  FOLVITE  Take 2 tablets (2 mg total) by mouth daily.     multivitamin-prenatal 27-0.8 MG Tabs tablet  Take 1 tablet by mouth daily.     oxyCODONE 5 MG immediate release tablet  Commonly known as:  Oxy IR/ROXICODONE  Take 1 tablet (5 mg total) by mouth every 4 (four) hours.          Consults:     SIGNIFICANT DIAGNOSTIC STUDIES:  Dg Chest 2 View  06/11/2015  CLINICAL DATA:  24 year old female with sickle cell disease, and pregnant in the third trimester. Sudden onset of chest pain this morning. Initial encounter. EXAM: CHEST  2 VIEW COMPARISON:  02/06/2015. FINDINGS: Cardiac size is stable at the upper limits of normal. Other mediastinal contours are within normal limits. Visualized tracheal air column is within normal limits. No pneumothorax, pulmonary edema, pleural effusion or confluent pulmonary opacity. Stable visualized osseous structures. Stable cholecystectomy clips. IMPRESSION: No  acute cardiopulmonary abnormality. Electronically Signed   By: Genevie Ann M.D.   On: 06/11/2015 08:57   [place] Mfm Ob Follow Up  05/23/2015  OBSTETRICAL ULTRASOUND: This exam was performed within a [facility] [facility] Department. The OB US report was generated in the AS system, and faxed to the ordering physician.  This report is available in the [facility]. See the AS Obstetric US report via the Image Link.   No results found for this or any previous visit (from the past 240 hour(s)).  BRIEF ADMITTING H & P: This is an opiate naive patient with HPFH who usually receives her care for SCD at [facility]. She has an average of 4 hospitalizations for crisis/year and was last hospitalized in 01/2015. Pt si [redacted] weeks pregnant and follows with the [facility]. Pt reports that she started having pain in her back and legs about 5 days ago which she managed with Tylenol. However the pain worsened and then developed in the posterior chest wall and then anterior chest wall. The tylenol was no longer effective thus she presented to the hospital. Her baseline pain is 0/10 and upon arrival to the ED her pain was 8/10. After 2 doses of IV Dilaudid the pain decreased to about 5/10 and is now starting to increase again.  She denies any F/C/N/V/D. She denies headaches, SOB, DOE or any focal weakness.  I am asked to admit her for acute sickle cell crisis I have spoken with Clenton Pare PA-C  at [facility] and he confirms that her baseline Hb is 9.0, WBC 11K and reticulocytes 12%.     Hospital Course:  Present on Admission:  . 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 Pt was subsequently weaned from PCA and Oxycodone was ordered but patient was very reluctant to take  oral analgesics as she was concerned that her baby (Pt pregnant) would be affected.  She also received IVF resuscitation for 1st 3 hours until she was tolerating oral intake well.She is discharged home on Tylenol for mild pain and she is issued a prescription for Oxycodone 5 mg #30 for severe pain. At the time of discharge she had a pain level of 2-3/5 and was ambulating without any difficulty.  . Leukocytosis; Pt had a mild leukocytosis which was likely related to her crisis. She showed no signs of infection and her urinalysis had no elements consistent with an infection.  . Intrauterine Pregnancy: Pt 28+ weeks pregnant. She was followed by Ob- rapid response nursing for fetal checks. At time of discharge she had good fetal movement and is to follow up with Ob-Gyn as scheduled.  Disposition and Follow-up: Home in good condition. Follow up with Ob-Gyn as scheduled and patient is to make an appointment with Clenton Pare, PA-C at [place]:  General: Alert, awake, oriented x3, in no acute distress. Vital Signs: BP 112/60, HR 77, TT98.7 F (37.1 C), temperature source Oral, RR16, height 5\' 2"  (1.575 m), weight 128 lb 12 oz (58.4 kg), last menstrual period 12/04/2014, SpO2 100 %. HEENT: [place]/AT PEERL, EOMI, anicteric Neck: Trachea midline, no masses, no thyromegal,y no JVD, no carotid bruit OROPHARYNX: Moist, No exudate/ erythema/lesions.  Heart: Regular rate and rhythm, without murmurs, rubs, gallops or S3. Pulmonary/Chest: Normal effort. Breath sounds normal. No. Apnea. Clear to auscultation,no stridor,  no wheezing and no rhonchi noted. No respiratory distress and no tenderness noted. Abdomen: Gravid abdomen. Neuro: Alert and oriented to person, place and time. Normal motor skills, Displays no atrophy or tremors and exhibits normal muscle tone.  No focal neurological deficits noted cranial nerves II through XII grossly intact. No sensory deficit noted. Strength  at baseline in bilateral upper and lower extremities. Gait normal. Musculoskeletal: No warmth swelling or erythema around joints, no spinal tenderness noted. Psychiatric: Patient alert and oriented x3, good insight and cognition, good recent to remote recall. Mood, memory, affect and judgement normal Lymph node survey: No cervical axillary or inguinal lymphadenopathy noted. Skin: Skin is warm and dry. No bruising, no ecchymosis and no rash noted. Pt is not diaphoretic. No erythema. No pallor   No results for input(s): NA, K, CL, CO2, GLUCOSE, BUN, CREATININE, CALCIUM, MG, PHOS in the last 72 hours. No results for input(s): AST, ALT, ALKPHOS, BILITOT, PROT, ALBUMIN in the last 72 hours. No results for input(s): LIPASE, AMYLASE in the last 72 hours. No results for input(s): WBC, NEUTROABS, HGB, HCT, MCV, PLT in the last 72 hours.   Total time spent including face to face and decision making was greater than 30 minutes  Signed: Maycen Degregory A. 06/16/2015, 10:13 AM

## 2015-07-04 ENCOUNTER — Ambulatory Visit (HOSPITAL_COMMUNITY)
Admission: RE | Admit: 2015-07-04 | Discharge: 2015-07-04 | Disposition: A | Discharge status: Home / Self Care | Payer: Medicaid Other | Source: Ambulatory Visit | Attending: Obstetrics & Gynecology | Admitting: Obstetrics & Gynecology

## 2015-07-04 ENCOUNTER — Other Ambulatory Visit (HOSPITAL_COMMUNITY): Payer: Self-pay | Admitting: Obstetrics and Gynecology

## 2015-07-04 DIAGNOSIS — O99013 Anemia complicating pregnancy, third trimester: Secondary | ICD-10-CM | POA: Insufficient documentation

## 2015-07-04 DIAGNOSIS — Z3A30 30 weeks gestation of pregnancy: Secondary | ICD-10-CM | POA: Insufficient documentation

## 2015-07-04 DIAGNOSIS — O99019 Anemia complicating pregnancy, unspecified trimester: Secondary | ICD-10-CM

## 2015-07-04 DIAGNOSIS — D571 Sickle-cell disease without crisis: Secondary | ICD-10-CM | POA: Insufficient documentation

## 2015-07-11 ENCOUNTER — Emergency Department (HOSPITAL_COMMUNITY)
Admission: EM | Admit: 2015-07-11 | Discharge: 2015-07-11 | Disposition: A | Discharge status: Home / Self Care | Payer: MEDICAID | Attending: Emergency Medicine | Admitting: Emergency Medicine

## 2015-07-11 ENCOUNTER — Encounter (HOSPITAL_COMMUNITY): Payer: Self-pay | Admitting: *Deleted

## 2015-07-11 DIAGNOSIS — O99019 Anemia complicating pregnancy, unspecified trimester: Secondary | ICD-10-CM | POA: Insufficient documentation

## 2015-07-11 DIAGNOSIS — O352XX0 Maternal care for (suspected) hereditary disease in fetus, not applicable or unspecified: Secondary | ICD-10-CM

## 2015-07-11 DIAGNOSIS — O09893 Supervision of other high risk pregnancies, third trimester: Secondary | ICD-10-CM | POA: Insufficient documentation

## 2015-07-11 DIAGNOSIS — Z79899 Other long term (current) drug therapy: Secondary | ICD-10-CM | POA: Insufficient documentation

## 2015-07-11 DIAGNOSIS — O0990 Supervision of high risk pregnancy, unspecified, unspecified trimester: Secondary | ICD-10-CM

## 2015-07-11 DIAGNOSIS — D571 Sickle-cell disease without crisis: Secondary | ICD-10-CM | POA: Insufficient documentation

## 2015-07-11 DIAGNOSIS — Z3A31 31 weeks gestation of pregnancy: Secondary | ICD-10-CM | POA: Insufficient documentation

## 2015-07-11 LAB — CBC WITH DIFFERENTIAL/PLATELET
BASOS ABS: 0 10*3/uL (ref 0.0–0.1)
BASOS PCT: 0 %
EOS ABS: 0.1 10*3/uL (ref 0.0–0.7)
Eosinophils Relative: 1 %
HEMATOCRIT: 21.7 % — AB (ref 36.0–46.0)
Hemoglobin: 7.8 g/dL — ABNORMAL LOW (ref 12.0–15.0)
LYMPHS ABS: 2.2 10*3/uL (ref 0.7–4.0)
LYMPHS PCT: 18 %
MCH: 38.6 pg — ABNORMAL HIGH (ref 26.0–34.0)
MCHC: 35.9 g/dL (ref 30.0–36.0)
MCV: 107.4 fL — ABNORMAL HIGH (ref 78.0–100.0)
MONO ABS: 1.3 10*3/uL — AB (ref 0.1–1.0)
Monocytes Relative: 11 %
NEUTROS PCT: 70 %
Neutro Abs: 8.5 10*3/uL — ABNORMAL HIGH (ref 1.7–7.7)
PLATELETS: 361 10*3/uL (ref 150–400)
RBC: 2.02 MIL/uL — AB (ref 3.87–5.11)
RDW: 19.4 % — AB (ref 11.5–15.5)
WBC: 12.1 10*3/uL — AB (ref 4.0–10.5)
nRBC: 20 /100 WBC — ABNORMAL HIGH

## 2015-07-11 LAB — COMPREHENSIVE METABOLIC PANEL
ALBUMIN: 2.6 g/dL — AB (ref 3.5–5.0)
ALK PHOS: 107 U/L (ref 38–126)
ALT: 13 U/L — ABNORMAL LOW (ref 14–54)
ANION GAP: 7 (ref 5–15)
AST: 32 U/L (ref 15–41)
BILIRUBIN TOTAL: 3.3 mg/dL — AB (ref 0.3–1.2)
BUN: <5 mg/dL — ABNORMAL LOW (ref 6–20)
CALCIUM: 8.3 mg/dL — AB (ref 8.9–10.3)
CO2: 23 mmol/L (ref 22–32)
Chloride: 108 mmol/L (ref 101–111)
Creatinine, Ser: <0.3 mg/dL — ABNORMAL LOW (ref 0.44–1.00)
GLUCOSE: 85 mg/dL (ref 65–99)
POTASSIUM: 3.2 mmol/L — AB (ref 3.5–5.1)
SODIUM: 138 mmol/L (ref 135–145)
Total Protein: 6.3 g/dL — ABNORMAL LOW (ref 6.5–8.1)

## 2015-07-11 LAB — URINALYSIS, ROUTINE W REFLEX MICROSCOPIC
GLUCOSE, UA: NEGATIVE mg/dL
Ketones, ur: NEGATIVE mg/dL
Nitrite: NEGATIVE
PH: 6 (ref 5.0–8.0)
PROTEIN: NEGATIVE mg/dL
Specific Gravity, Urine: 1.012 (ref 1.005–1.030)

## 2015-07-11 LAB — URINE MICROSCOPIC-ADD ON

## 2015-07-11 LAB — RETICULOCYTES: RBC.: 2.02 MIL/uL — AB (ref 3.87–5.11)

## 2015-07-11 LAB — ACETAMINOPHEN LEVEL: Acetaminophen (Tylenol), Serum: 14 ug/mL (ref 10–30)

## 2015-07-11 LAB — LIPASE, BLOOD: Lipase: 22 U/L (ref 11–51)

## 2015-07-11 MED ORDER — KCL IN DEXTROSE-NACL 20-5-0.45 MEQ/L-%-% IV SOLN
Freq: Once | INTRAVENOUS | Status: AC
  Administered 2015-07-11: 10:00:00 via INTRAVENOUS
  Filled 2015-07-11: qty 1000

## 2015-07-11 MED ORDER — HYDROMORPHONE HCL 1 MG/ML IJ SOLN
0.5000 mg | Freq: Once | INTRAMUSCULAR | Status: AC
  Administered 2015-07-11: 0.5 mg via INTRAVENOUS
  Filled 2015-07-11: qty 1

## 2015-07-11 MED ORDER — SODIUM CHLORIDE 0.9 % IV BOLUS (SEPSIS)
1000.0000 mL | Freq: Once | INTRAVENOUS | Status: AC
  Administered 2015-07-11: 1000 mL via INTRAVENOUS

## 2015-07-11 NOTE — Progress Notes (Addendum)
[id number]  Arrived to evaluate this 24 yo G1P0 @ 31.[redacted] wks GA in with report of sudden onset CP at 4 AM.  Patient has a history of sickle cell disease with a recent admission 2 wks ago for crisis.  She is a patient at the [facility].  She denies vaginal bleeding, leaking of fluid from vagina or UC's and reports good fetal movement.  89  Dr. Kennon Rounds notified of patient in ED and of above history of complaint and of results of fetal monitoring.  FHR Category I with no evidence of contractions.  Per Dr. Kennon Rounds patient is OB cleared.  EDP to continue medical evaluation for possible sickle cell crisis and will contact OB provider or [facility] RN if patient is to be admitted.

## 2015-07-11 NOTE — ED Provider Notes (Signed)
CSN: [id number]     Arrival date & time 07/11/15  0608 History   First MD Initiated Contact with Patient 07/11/15 [phone]     Chief Complaint  Patient presents with  . Sickle Cell Pain Crisis     (Consider location/radiation/quality/duration/timing/severity/associated sxs/prior Treatment) HPI Patient presents with concern of back pain, leg pain, now resolved chest pain. Patient is [redacted] weeks pregnant, G1, P0. She was generally well until about 5 hours ago. She woke with pain in her back, legs, which is a typical distribution for her during sickle cell pain episodes. However, the patient also had diffuse anterior chest discomfort. There was no nausea, vomiting, fever, chills. Pain changed minimally with Tylenol. Currently chest pain is negligible, and she continued to deny dyspnea, nausea, vomiting, fever, chills. No abdominal pain either. Patient was admitted 2 weeks ago for sickle cell pain crisis. She receives her obstetric care here, her sickle cell care at [facility].   Past Medical History  Diagnosis Date  . Sickle cell anemia (HCC)   . Sickle cell anemia ([place]) 1996   Past Surgical History  Procedure Laterality Date  . Cholecystectomy  2007   Family History  Problem Relation Age of Onset  . Sickle cell anemia Sister   . Diabetes Paternal Grandmother    Social History  Substance Use Topics  . Smoking status: Never Smoker   . Smokeless tobacco: None  . Alcohol Use: No   OB History    Gravida Para Term Preterm AB TAB SAB Ectopic Multiple Living   1              Review of Systems  Constitutional:       Per HPI, otherwise negative  HENT:       Per HPI, otherwise negative  Respiratory:       Per HPI, otherwise negative  Cardiovascular:       Per HPI, otherwise negative  Gastrointestinal: Negative for vomiting.  Endocrine:       Negative aside from HPI  Genitourinary:       Neg aside from HPI   Musculoskeletal:       Per HPI, otherwise negative  Skin:  Negative.   Neurological: Negative for syncope.      Allergies  Ceftin  Home Medications   Prior to Admission medications   Medication Sig Start Date End Date Taking? Authorizing Provider  acetaminophen (TYLENOL) 500 MG tablet Take 1,000 mg by mouth every 6 (six) hours as needed for mild pain.   Yes Historical Provider, MD  DM-Phenylephrine-Acetaminophen (TYLENOL COLD MAX) 10-5-325 MG/15ML LIQD Take 1 Dose by mouth daily as needed (cold symptoms).   Yes Historical Provider, MD  folic acid (FOLVITE) 1 MG tablet Take 2 tablets (2 mg total) by mouth daily. 06/16/15  Yes [name], MD  oxyCODONE (OXY IR/ROXICODONE) 5 MG immediate release tablet Take 1 tablet (5 mg total) by mouth every 4 (four) hours. 06/16/15  Yes [name], MD  Prenatal Vit-Fe Fumarate-FA (MULTIVITAMIN-PRENATAL) 27-0.8 MG TABS tablet Take 1 tablet by mouth daily.    Yes Historical Provider, MD   BP 105/86 mmHg  Pulse 89  Temp(Src) 98.1 F (36.7 C) (Oral)  Resp 15  SpO2 93%  LMP 12/04/2014 Physical Exam  Constitutional: She is oriented to person, place, and time. She appears well-developed and well-nourished. No distress.  HENT:  Head: Normocephalic and atraumatic.  Eyes: Conjunctivae and EOM are normal.  Cardiovascular: Normal rate and regular rhythm.   Pulmonary/Chest:  Effort normal and breath sounds normal. No stridor. No respiratory distress.  Abdominal: She exhibits no distension.  Gravid, nontender abdomen. Fetal monitoring pads in place.   Musculoskeletal: She exhibits no edema.  Neurological: She is alert and oriented to person, place, and time. No cranial nerve deficit.  Skin: Skin is warm and dry.  Psychiatric: She has a normal mood and affect.  Nursing note and vitals reviewed.   ED Course  Procedures (including critical care time) Labs Review Labs Reviewed  COMPREHENSIVE METABOLIC PANEL - Abnormal; Notable for the following:    Potassium 3.2 (*)    BUN <5 (*)     Creatinine, Ser <0.30 (*)    Calcium 8.3 (*)    Total Protein 6.3 (*)    Albumin 2.6 (*)    ALT 13 (*)    Total Bilirubin 3.3 (*)    All other components within normal limits  CBC WITH DIFFERENTIAL/PLATELET - Abnormal; Notable for the following:    WBC 12.1 (*)    RBC 2.02 (*)    Hemoglobin 7.8 (*)    HCT 21.7 (*)    MCV 107.4 (*)    MCH 38.6 (*)    RDW 19.4 (*)    nRBC 20 (*)    Neutro Abs 8.5 (*)    Monocytes Absolute 1.3 (*)    All other components within normal limits  RETICULOCYTES - Abnormal; Notable for the following:    RBC. 2.02 (*)    All other components within normal limits  URINALYSIS, ROUTINE W REFLEX MICROSCOPIC (NOT AT [facility]) - Abnormal; Notable for the following:    Color, Urine AMBER (*)    APPearance CLOUDY (*)    Hgb urine dipstick TRACE (*)    Bilirubin Urine SMALL (*)    Leukocytes, UA MODERATE (*)    All other components within normal limits  URINE MICROSCOPIC-ADD ON - Abnormal; Notable for the following:    Squamous Epithelial / LPF 6-30 (*)    Bacteria, UA MANY (*)    All other components within normal limits  LIPASE, BLOOD  ACETAMINOPHEN LEVEL   Proper response involved soon after the patient's arrival. The patient had fetal monitoring equipment applied. Fetal heart tones in the 150 range, consistently.  Chart review demonstrates recent hospitalization for sickle cell pain crisis, with transition from narcotic PCA to oxycodone to Tylenol.  Patient's labs notable for hypokalemia.  Patient received additional fluid resuscitation with D5, half-normal, with potassium.   9:35 AM Patient is been cleared by our obstetrics colleagues.  11:25 AM Patient sleeping, in no distress. She will follow-up with [facility], pain management at [place] female presents with ongoing diffuse pain. Patient has sickle cell disease, 31 weeks pregnancy, but here, her evaluation is largely reassuring, with no evidence for  decompensation. Patient's pain improved substantially, and after repletion of her hypokalemia, she was discharged in stable condition to follow up with her providers as an outpatient.  Carmin Muskrat, MD 07/11/15 1126

## 2015-07-11 NOTE — Discharge Instructions (Signed)
As discussed, your evaluation today has been largely reassuring.  But, it is important that you monitor your condition carefully, and do not hesitate to return to the ED if you develop new, or concerning changes in your condition. ? ?Otherwise, please follow-up with your physician for appropriate ongoing care. ? ?

## 2015-07-11 NOTE — ED Notes (Signed)
Pt arrives to the ER c/o of [place] pain in back and left leg.  Pt states pain started 2 hours ago. Pt took 2 Tylenol with no relief Pt is [redacted] weeks pregnant

## 2015-07-13 ENCOUNTER — Emergency Department (HOSPITAL_COMMUNITY): Payer: Medicaid Other

## 2015-07-13 ENCOUNTER — Inpatient Hospital Stay (HOSPITAL_COMMUNITY)
Admission: EM | Admit: 2015-07-13 | Discharge: 2015-07-18 | DRG: 781 | Disposition: A | Discharge status: Home / Self Care | Payer: Medicaid Other | Attending: Internal Medicine | Admitting: Internal Medicine

## 2015-07-13 ENCOUNTER — Encounter (HOSPITAL_COMMUNITY): Payer: Self-pay | Admitting: Emergency Medicine

## 2015-07-13 DIAGNOSIS — D5700 Hb-SS disease with crisis, unspecified: Secondary | ICD-10-CM | POA: Diagnosis present

## 2015-07-13 DIAGNOSIS — O99013 Anemia complicating pregnancy, third trimester: Principal | ICD-10-CM | POA: Diagnosis present

## 2015-07-13 DIAGNOSIS — J069 Acute upper respiratory infection, unspecified: Secondary | ICD-10-CM | POA: Diagnosis present

## 2015-07-13 DIAGNOSIS — Z3493 Encounter for supervision of normal pregnancy, unspecified, third trimester: Secondary | ICD-10-CM

## 2015-07-13 DIAGNOSIS — Z833 Family history of diabetes mellitus: Secondary | ICD-10-CM

## 2015-07-13 DIAGNOSIS — Z3A31 31 weeks gestation of pregnancy: Secondary | ICD-10-CM

## 2015-07-13 DIAGNOSIS — O99019 Anemia complicating pregnancy, unspecified trimester: Secondary | ICD-10-CM

## 2015-07-13 DIAGNOSIS — O0990 Supervision of high risk pregnancy, unspecified, unspecified trimester: Secondary | ICD-10-CM

## 2015-07-13 DIAGNOSIS — K5900 Constipation, unspecified: Secondary | ICD-10-CM | POA: Diagnosis not present

## 2015-07-13 DIAGNOSIS — O352XX0 Maternal care for (suspected) hereditary disease in fetus, not applicable or unspecified: Secondary | ICD-10-CM

## 2015-07-13 DIAGNOSIS — D638 Anemia in other chronic diseases classified elsewhere: Secondary | ICD-10-CM | POA: Diagnosis present

## 2015-07-13 DIAGNOSIS — Z832 Family history of diseases of the blood and blood-forming organs and certain disorders involving the immune mechanism: Secondary | ICD-10-CM

## 2015-07-13 DIAGNOSIS — Z9049 Acquired absence of other specified parts of digestive tract: Secondary | ICD-10-CM

## 2015-07-13 DIAGNOSIS — D571 Sickle-cell disease without crisis: Secondary | ICD-10-CM

## 2015-07-13 LAB — URINALYSIS, ROUTINE W REFLEX MICROSCOPIC
Glucose, UA: NEGATIVE mg/dL
Ketones, ur: NEGATIVE mg/dL
NITRITE: NEGATIVE
Protein, ur: 100 mg/dL — AB
SPECIFIC GRAVITY, URINE: 1.017 (ref 1.005–1.030)
pH: 6 (ref 5.0–8.0)

## 2015-07-13 LAB — CBC WITH DIFFERENTIAL/PLATELET
BASOS ABS: 0 10*3/uL (ref 0.0–0.1)
Basophils Relative: 0 %
EOS PCT: 0 %
Eosinophils Absolute: 0 10*3/uL (ref 0.0–0.7)
HCT: 22.9 % — ABNORMAL LOW (ref 36.0–46.0)
HEMOGLOBIN: 8.1 g/dL — AB (ref 12.0–15.0)
Lymphocytes Relative: 8 %
Lymphs Abs: 1.2 10*3/uL (ref 0.7–4.0)
MCH: 38.2 pg — AB (ref 26.0–34.0)
MCHC: 35.4 g/dL (ref 30.0–36.0)
MCV: 108 fL — ABNORMAL HIGH (ref 78.0–100.0)
MONOS PCT: 7 %
Monocytes Absolute: 1 10*3/uL (ref 0.1–1.0)
NEUTROS ABS: 12.6 10*3/uL — AB (ref 1.7–7.7)
Neutrophils Relative %: 85 %
Platelets: 415 10*3/uL — ABNORMAL HIGH (ref 150–400)
RBC: 2.12 MIL/uL — AB (ref 3.87–5.11)
RDW: 21.4 % — ABNORMAL HIGH (ref 11.5–15.5)
WBC: 14.8 10*3/uL — AB (ref 4.0–10.5)
nRBC: 25 /100 WBC — ABNORMAL HIGH

## 2015-07-13 LAB — BASIC METABOLIC PANEL
Anion gap: 7 (ref 5–15)
CHLORIDE: 109 mmol/L (ref 101–111)
CO2: 21 mmol/L — ABNORMAL LOW (ref 22–32)
Calcium: 8.2 mg/dL — ABNORMAL LOW (ref 8.9–10.3)
Creatinine, Ser: <0.3 mg/dL — ABNORMAL LOW (ref 0.44–1.00)
Glucose, Bld: 85 mg/dL (ref 65–99)
POTASSIUM: 3.4 mmol/L — AB (ref 3.5–5.1)
SODIUM: 137 mmol/L (ref 135–145)

## 2015-07-13 LAB — URINE MICROSCOPIC-ADD ON: RBC / HPF: NONE SEEN RBC/hpf (ref 0–5)

## 2015-07-13 LAB — RETICULOCYTES: RBC.: 2.12 MIL/uL — AB (ref 3.87–5.11)

## 2015-07-13 MED ORDER — HYDROMORPHONE HCL 1 MG/ML IJ SOLN
0.5000 mg | Freq: Once | INTRAMUSCULAR | Status: AC
  Administered 2015-07-13: 0.5 mg via INTRAVENOUS
  Filled 2015-07-13: qty 1

## 2015-07-13 MED ORDER — DIPHENHYDRAMINE HCL 12.5 MG/5ML PO ELIX: 12.5000 mg | ORAL_SOLUTION | Freq: Four times a day (QID) | ORAL | Status: DC | PRN

## 2015-07-13 MED ORDER — ENOXAPARIN SODIUM 30 MG/0.3ML ~~LOC~~ SOLN
30.0000 mg | SUBCUTANEOUS | Status: DC
  Administered 2015-07-13 – 2015-07-17 (×5): 30 mg via SUBCUTANEOUS
  Filled 2015-07-13 (×5): qty 0.3

## 2015-07-13 MED ORDER — ACETAMINOPHEN 500 MG PO TABS
1000.0000 mg | ORAL_TABLET | Freq: Four times a day (QID) | ORAL | Status: DC | PRN
  Administered 2015-07-17: 1000 mg via ORAL
  Filled 2015-07-13: qty 2

## 2015-07-13 MED ORDER — FOLIC ACID 1 MG PO TABS
2.0000 mg | ORAL_TABLET | Freq: Every day | ORAL | Status: DC
  Administered 2015-07-14 – 2015-07-18 (×5): 2 mg via ORAL
  Filled 2015-07-13 (×5): qty 2

## 2015-07-13 MED ORDER — POLYETHYLENE GLYCOL 3350 17 G PO PACK
17.0000 g | PACK | Freq: Every day | ORAL | Status: DC | PRN
  Administered 2015-07-15 – 2015-07-16 (×2): 17 g via ORAL
  Filled 2015-07-13 (×2): qty 1

## 2015-07-13 MED ORDER — NALOXONE HCL 0.4 MG/ML IJ SOLN: 0.4000 mg | INTRAMUSCULAR | Status: DC | PRN

## 2015-07-13 MED ORDER — PRENATAL 27-0.8 MG PO TABS
1.0000 | ORAL_TABLET | Freq: Every morning | ORAL | Status: DC
  Administered 2015-07-14 – 2015-07-18 (×5): 1 via ORAL
  Filled 2015-07-13 (×5): qty 1

## 2015-07-13 MED ORDER — SODIUM CHLORIDE 0.9 % IV BOLUS (SEPSIS)
1000.0000 mL | Freq: Once | INTRAVENOUS | Status: AC
  Administered 2015-07-13: 1000 mL via INTRAVENOUS

## 2015-07-13 MED ORDER — HYDROMORPHONE HCL 1 MG/ML IJ SOLN: 1.0000 mg | Freq: Once | INTRAMUSCULAR | Status: AC

## 2015-07-13 MED ORDER — DIPHENHYDRAMINE HCL 50 MG/ML IJ SOLN: 12.5000 mg | Freq: Four times a day (QID) | INTRAMUSCULAR | Status: DC | PRN

## 2015-07-13 MED ORDER — SENNOSIDES-DOCUSATE SODIUM 8.6-50 MG PO TABS
1.0000 | ORAL_TABLET | Freq: Two times a day (BID) | ORAL | Status: DC
  Administered 2015-07-13 – 2015-07-18 (×10): 1 via ORAL
  Filled 2015-07-13 (×10): qty 1

## 2015-07-13 MED ORDER — DEXTROSE-NACL 5-0.45 % IV SOLN
INTRAVENOUS | Status: DC
  Administered 2015-07-13 – 2015-07-14 (×2): via INTRAVENOUS
  Administered 2015-07-14 – 2015-07-15 (×2): 1000 mL via INTRAVENOUS

## 2015-07-13 MED ORDER — ONDANSETRON HCL 4 MG/2ML IJ SOLN
4.0000 mg | Freq: Four times a day (QID) | INTRAMUSCULAR | Status: DC | PRN
  Administered 2015-07-13: 4 mg via INTRAVENOUS
  Filled 2015-07-13: qty 2

## 2015-07-13 MED ORDER — SODIUM CHLORIDE 0.9 % IJ SOLN: 9.0000 mL | INTRAMUSCULAR | Status: DC | PRN

## 2015-07-13 MED ORDER — HYDROMORPHONE 1 MG/ML IV SOLN
INTRAVENOUS | Status: DC
  Administered 2015-07-13: 0.6 mg via INTRAVENOUS
  Administered 2015-07-13: 2.4 mg via INTRAVENOUS
  Administered 2015-07-13: 19:00:00 via INTRAVENOUS
  Administered 2015-07-14: 1.8 mg via INTRAVENOUS
  Administered 2015-07-14: 0.3 mg via INTRAVENOUS
  Filled 2015-07-13: qty 25

## 2015-07-13 MED ORDER — OXYCODONE HCL 5 MG PO TABS
10.0000 mg | ORAL_TABLET | Freq: Once | ORAL | Status: AC
  Administered 2015-07-13: 10 mg via ORAL
  Filled 2015-07-13: qty 2

## 2015-07-13 NOTE — ED Notes (Signed)
OB in the room

## 2015-07-13 NOTE — ED Notes (Signed)
I stuck patient twice for blood, I was unsuccessful  X2. RN Tim notified.

## 2015-07-13 NOTE — H&P (Signed)
Gabriella Griffin is an 24 y.o. female.   Chief Complaint: Pain in arms and legs HPI: A 24 yo patient with history of sickle cell disease who is also [redacted] weeks pregnant admitted with sickle cell painful crisis. Patient has been having pain in her arms and legs and lower back rated as 9 out of 10. She was seen in the ER 2 days ago was treated and discharged home. She came back to the ER today with this pain. Pain is typical of her sickle cell. No associated nausea vomiting or diarrhea. Not relieved by her home medications. She has received multiple doses of Dilaudid in the ER and still not getting better so she's been admitted to the hospital. Patient is [redacted] weeks pregnant and is being followed by OB/GYN. She has been evaluated by the OB rapid response team in the ER and the baby seems to be doing well. She has been cleared to be admitted to [facility] long for pain management.  Past Medical History  Diagnosis Date  . Sickle cell anemia (HCC)   . Sickle cell anemia ([place]) 1996    Past Surgical History  Procedure Laterality Date  . Cholecystectomy  2007    Family History  Problem Relation Age of Onset  . Sickle cell anemia Sister   . Diabetes Paternal Grandmother    Social History:  reports that she has never smoked. She does not have any smokeless tobacco history on file. She reports that she does not drink alcohol or use illicit drugs.  Allergies:  Allergies  Allergen Reactions  . Ceftin [Cefuroxime Axetil] Rash     (Not in a hospital admission)  Results for orders placed or performed during the hospital encounter of 07/13/15 (from the past 48 hour(s))  Basic metabolic panel     Status: Abnormal   Collection Time: 07/13/15 11:16 AM  Result Value Ref Range   Sodium 137 135 - 145 mmol/L   Potassium 3.4 (L) 3.5 - 5.1 mmol/L   Chloride 109 101 - 111 mmol/L   CO2 21 (L) 22 - 32 mmol/L   Glucose, Bld 85 65 - 99 mg/dL   BUN <5 (L) 6 - 20 mg/dL   Creatinine, Ser <0.30 (L) 0.44 - 1.00 mg/dL    Calcium 8.2 (L) 8.9 - 10.3 mg/dL   GFR calc non Af Amer NOT CALCULATED >60 mL/min   GFR calc Af Amer NOT CALCULATED >60 mL/min    Comment: (NOTE) The eGFR has been calculated using the CKD EPI equation. This calculation has not been validated in all clinical situations. eGFR's persistently <60 mL/min signify possible Chronic Kidney Disease.    Anion gap 7 5 - 15  CBC with Differential     Status: Abnormal   Collection Time: 07/13/15 11:16 AM  Result Value Ref Range   WBC 14.8 (H) 4.0 - 10.5 K/uL    Comment: ADJUSTED FOR NUCLEATED RBC'S   RBC 2.12 (L) 3.87 - 5.11 MIL/uL   Hemoglobin 8.1 (L) 12.0 - 15.0 g/dL   HCT 22.9 (L) 36.0 - 46.0 %   MCV 108.0 (H) 78.0 - 100.0 fL   MCH 38.2 (H) 26.0 - 34.0 pg   MCHC 35.4 30.0 - 36.0 g/dL   RDW 21.4 (H) 11.5 - 15.5 %   Platelets 415 (H) 150 - 400 K/uL   Neutrophils Relative % 85 %   Lymphocytes Relative 8 %   Monocytes Relative 7 %   Eosinophils Relative 0 %   Basophils Relative 0 %  nRBC 25 (H) 0 /100 WBC   Neutro Abs 12.6 (H) 1.7 - 7.7 K/uL   Lymphs Abs 1.2 0.7 - 4.0 K/uL   Monocytes Absolute 1.0 0.1 - 1.0 K/uL   Eosinophils Absolute 0.0 0.0 - 0.7 K/uL   Basophils Absolute 0.0 0.0 - 0.1 K/uL   RBC Morphology SICKLE CELLS     Comment: TARGET CELLS POLYCHROMASIA PRESENT    Smear Review LARGE PLATELETS PRESENT   Reticulocytes     Status: Abnormal   Collection Time: 07/13/15 11:16 AM  Result Value Ref Range   Retic Ct Pct >23.0 (H) 0.4 - 3.1 %   RBC. 2.12 (L) 3.87 - 5.11 MIL/uL   Retic Count, Manual NOT CALCULATED 19.0 - 186.0 K/uL  Urinalysis, Routine w reflex microscopic     Status: Abnormal   Collection Time: 07/13/15 11:33 AM  Result Value Ref Range   Color, Urine ORANGE (A) YELLOW    Comment: BIOCHEMICALS MAY BE AFFECTED BY COLOR   APPearance HAZY (A) CLEAR   Specific Gravity, Urine 1.017 1.005 - 1.030   pH 6.0 5.0 - 8.0   Glucose, UA NEGATIVE NEGATIVE mg/dL   Hgb urine dipstick SMALL (A) NEGATIVE   Bilirubin Urine SMALL  (A) NEGATIVE   Ketones, ur NEGATIVE NEGATIVE mg/dL   Protein, ur 100 (A) NEGATIVE mg/dL   Nitrite NEGATIVE NEGATIVE   Leukocytes, UA SMALL (A) NEGATIVE  Urine microscopic-add on     Status: Abnormal   Collection Time: 07/13/15 11:33 AM  Result Value Ref Range   Squamous Epithelial / LPF 6-30 (A) NONE SEEN    Comment: Please note change in reference range.   WBC, UA 0-5 0 - 5 WBC/hpf    Comment: Please note change in reference range.   RBC / HPF NONE SEEN 0 - 5 RBC/hpf    Comment: Please note change in reference range.   Bacteria, UA FEW (A) NONE SEEN    Comment: Please note change in reference range.   Urine-Other MUCOUS PRESENT    Dg Chest 2 View  07/13/2015  CLINICAL DATA:  24 year old female with history of sickle cell disease presenting with sickle cell pain crisis today. Diffuse body aches. EXAM: CHEST  2 VIEW COMPARISON:  Chest x-ray 06/11/2015. FINDINGS: Mild scarring in the right middle lobe, similar to the prior examination. No acute consolidative airspace disease. No pleural effusions. No evidence of pulmonary edema. Mild cardiomegaly. Upper mediastinal contours are within normal limits. 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 Surgical clips in the right upper quadrant of the abdomen, likely from prior cholecystectomy. IMPRESSION: 1. No radiographic evidence of acute cardiopulmonary disease. 2. Mild scarring in the right middle lobe is unchanged. 3. Mild cardiomegaly. Electronically Signed   By: Vinnie Langton M.D.   On: 07/13/2015 12:21    Review of Systems  Constitutional: Negative.   HENT: Negative.   Eyes: Negative.   Respiratory: Negative.   Cardiovascular: Negative.   Gastrointestinal: Positive for heartburn. Negative for nausea and vomiting.  Genitourinary: Negative.   Musculoskeletal: Positive for myalgias, back pain and joint pain.  Skin: Negative.   Neurological: Negative.   Endo/Heme/Allergies: Negative.    Psychiatric/Behavioral: Negative.     Blood pressure 104/64, pulse 89, temperature 98.3 F (36.8 C), temperature source Oral, resp. rate 21, last menstrual period 12/04/2014, SpO2 92 %. Physical Exam  Constitutional: She is oriented to person, place, and time. She appears well-developed and well-nourished.  HENT:  Head: Normocephalic and  atraumatic.  Eyes: Conjunctivae and EOM are normal. Pupils are equal, round, and reactive to light.  Neck: Normal range of motion. Neck supple.  Cardiovascular: Normal rate and regular rhythm.   Respiratory: Effort normal and breath sounds normal.  GI: Soft. Bowel sounds are normal. She exhibits distension.  [redacted] weeks pregnant  Genitourinary:  [redacted] weeks pregnant  Musculoskeletal: Normal range of motion. She exhibits no edema.  Neurological: She is alert and oriented to person, place, and time. She has normal reflexes.  Skin: Skin is warm and dry.  Psychiatric: She has a normal mood and affect.     Assessment/Plan A 24 year old female admitted with sickle cell painful crisis. #1 sickle cell painful crisis: Patient will be admitted for pain management. I will start her on Dilaudid PCA with IV fluids at 75 an hour. She will not get Toradol due to pregnancy. We will start her home medications at some point. She is relatively opiate nave taking only oxycodone 5 mg every 4 hours as needed. #2 Third trimester pregnancy: Patient will be followed by obstetrics while in the hospital. Daily fetal heart monitoring will also be instituted. #3 leukocytosis: Probably due to sickle cell crisis. Continue to monitor.  Guiseppe Flanagan,LAWAL 07/13/2015, 4:43 PM

## 2015-07-13 NOTE — Progress Notes (Signed)
Spoke with Dr. Kennon Rounds. Pt is a G1P0 at 314/[redacted] weeks gestation who is going to be admitted management of sickle cell crisis. FHR tracing is category 1 with some ui noted on EFM. Says she wants pt's cervix checked and if she is still closed, she can go to the floor with orders for NST every day.

## 2015-07-13 NOTE — Progress Notes (Signed)
Pt is a G1P0 at 314/[redacted] weeks gestation. Pt says her due date has been changed and she was told it is 09/10/15 which would put her at 32 weeks. She gets her care at the [place] [facility] at [facility] where she is being followed for sickle cell anemia. Pt denies vaginal bleeding, leaking of fluid, or uc's. She is being admitted today at [place] because of sickle cell crisis. Pt says she has pain in her arms, legs, shoulders that is somewhat relieved by pain meds she as received.

## 2015-07-13 NOTE — ED Notes (Signed)
Pt states that she woke up this morning around 0800 w/ generalized pain from [facility].  [redacted] wks pregnant.

## 2015-07-13 NOTE — ED Notes (Signed)
Provider in room  

## 2015-07-13 NOTE — ED Provider Notes (Signed)
CSN: [id number]     Arrival date & time 07/13/15  1020 History   First MD Initiated Contact with Patient 07/13/15 1103     Chief Complaint  Patient presents with  . Sickle Cell Pain Crisis     (Consider location/radiation/quality/duration/timing/severity/associated sxs/prior Treatment) HPI Comments: 24 year old female with sickle cell anemia who is currently [redacted] weeks pregnant who presents with pain crisis. Patient was here a few days ago for pain crisis and discharged home. She reports that her pain initially improved but this morning around 8 AM she woke up with pain. She states it started in her right shoulder and has moved throughout her entire body. She does endorse some chest pain although she states she has had chest pain with previous pain crises. She denies any fevers, vomiting, abdominal pain, vaginal bleeding or discharge, or cough/cold symptoms. No urinary symptoms. She has felt normal fetal movement today. She took Tylenol earlier for the pain.  Patient is a 24 y.o. female presenting with sickle cell pain. The history is provided by the patient.  Sickle Cell Pain Crisis   Past Medical History  Diagnosis Date  . Sickle cell anemia (HCC)   . Sickle cell anemia ([place]) 1996   Past Surgical History  Procedure Laterality Date  . Cholecystectomy  2007   Family History  Problem Relation Age of Onset  . Sickle cell anemia Sister   . Diabetes Paternal Grandmother    Social History  Substance Use Topics  . Smoking status: Never Smoker   . Smokeless tobacco: None  . Alcohol Use: No   OB History    Gravida Para Term Preterm AB TAB SAB Ectopic Multiple Living   1              Review of Systems 10 Systems reviewed and are negative for acute change except as noted in the HPI.   Allergies  Ceftin  Home Medications   Prior to Admission medications   Medication Sig Start Date End Date Taking? Authorizing Provider  acetaminophen (TYLENOL) 500 MG tablet Take 1,000 mg by  mouth every 6 (six) hours as needed for mild pain.   Yes Historical Provider, MD  folic acid (FOLVITE) 1 MG tablet Take 2 tablets (2 mg total) by mouth daily. 06/16/15  Yes [name], MD  Prenatal Vit-Fe Fumarate-FA (MULTIVITAMIN-PRENATAL) 27-0.8 MG TABS tablet Take 1 tablet by mouth every morning.    Yes Historical Provider, MD  oxyCODONE (OXY IR/ROXICODONE) 5 MG immediate release tablet Take 1 tablet (5 mg total) by mouth every 4 (four) hours. Patient not taking: Reported on 07/13/2015 06/16/15   [name], MD   BP 104/64 mmHg  Pulse 89  Temp(Src) 98.3 F (36.8 C) (Oral)  Resp 21  SpO2 92%  LMP 12/04/2014 Physical Exam  Constitutional: She is oriented to person, place, and time. She appears well-developed and well-nourished. No distress.  uncomfortable  HENT:  Head: Normocephalic and atraumatic.  Moist mucous membranes  Eyes: Pupils are equal, round, and reactive to light. Scleral icterus is present.  Neck: Neck supple.  Cardiovascular: Normal rate, regular rhythm and normal heart sounds.   No murmur heard. Pulmonary/Chest: Effort normal and breath sounds normal.  Abdominal: Soft. Bowel sounds are normal. There is no tenderness.  Uterus palpable above umbilicus  Musculoskeletal: She exhibits no edema.  Neurological: She is alert and oriented to person, place, and time.  Fluent speech  Skin: Skin is warm and dry. No rash noted.  Psychiatric: She has  a normal mood and affect. Judgment normal.  Nursing note and vitals reviewed.   ED Course  Procedures (including critical care time) Labs Review Labs Reviewed  BASIC METABOLIC PANEL - Abnormal; Notable for the following:    Potassium 3.4 (*)    CO2 21 (*)    BUN <5 (*)    Creatinine, Ser <0.30 (*)    Calcium 8.2 (*)    All other components within normal limits  CBC WITH DIFFERENTIAL/PLATELET - Abnormal; Notable for the following:    WBC 14.8 (*)    RBC 2.12 (*)    Hemoglobin 8.1 (*)    HCT 22.9 (*)     MCV 108.0 (*)    MCH 38.2 (*)    RDW 21.4 (*)    Platelets 415 (*)    nRBC 25 (*)    Neutro Abs 12.6 (*)    All other components within normal limits  URINALYSIS, ROUTINE W REFLEX MICROSCOPIC (NOT AT [facility]) - Abnormal; Notable for the following:    Color, Urine ORANGE (*)    APPearance HAZY (*)    Hgb urine dipstick SMALL (*)    Bilirubin Urine SMALL (*)    Protein, ur 100 (*)    Leukocytes, UA SMALL (*)    All other components within normal limits  RETICULOCYTES - Abnormal; Notable for the following:    Retic Ct Pct >23.0 (*)    RBC. 2.12 (*)    All other components within normal limits  URINE MICROSCOPIC-ADD ON - Abnormal; Notable for the following:    Squamous Epithelial / LPF 6-30 (*)    Bacteria, UA FEW (*)    All other components within normal limits    Imaging Review Dg Chest 2 View  07/13/2015  CLINICAL DATA:  24 year old female with history of sickle cell disease presenting with sickle cell pain crisis today. Diffuse body aches. EXAM: CHEST  2 VIEW COMPARISON:  Chest x-ray 06/11/2015. FINDINGS: Mild scarring in the right middle lobe, similar to the prior examination. No acute consolidative airspace disease. No pleural effusions. No evidence of pulmonary edema. Mild cardiomegaly. Upper mediastinal contours are within normal limits. 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 Surgical clips in the right upper quadrant of the abdomen, likely from prior cholecystectomy. IMPRESSION: 1. No radiographic evidence of acute cardiopulmonary disease. 2. Mild scarring in the right middle lobe is unchanged. 3. Mild cardiomegaly. Electronically Signed   By: Vinnie Langton M.D.   On: 07/13/2015 12:21   I have personally reviewed and evaluated these lab results as part of my medical decision-making.   EKG Interpretation   Date/Time:  Sunday July 13 2015 11:56:11 EST Ventricular Rate:  82 PR Interval:  161 QRS Duration: 85 QT  Interval:  368 QTC Calculation: 430 R Axis:   68 Text Interpretation:  Sinus rhythm Nonspecific T abnormalities, anterior  leads No significant change since last tracing Confirmed by LITTLE MD,  RACHEL [phone]) on 07/13/2015 2:27:00 PM     Medications  HYDROmorphone (DILAUDID) injection 0.5 mg (not administered)  oxyCODONE (Oxy IR/ROXICODONE) immediate release tablet 10 mg (not administered)  HYDROmorphone (DILAUDID) injection 1 mg (not administered)  sodium chloride 0.9 % bolus 1,000 mL (1,000 mLs Intravenous New Bag/Given 07/13/15 1152)  HYDROmorphone (DILAUDID) injection 0.5 mg (0.5 mg Intravenous Given 07/13/15 1252)    MDM   Final diagnoses:  Sickle cell pain crisis ([place])  Third trimester pregnancy   Patient presents with pain crisis that began  this morning in the setting of 31 week pregnancy. No reports of infectious symptoms. On exam, patient uncomfortable but nontoxic. Vital signs stable, O2 sat 93% on room air. No abdominal tenderness. Bedside ultrasound shows fetal heart rate at 142 BPM. Obtained above labs and CXR. Gave IVF bolus and dilaudid for pain.  Labs showed no evidence of infection, stable anemia, chest x-ray negative acute, and EKG unremarkable. After several doses of Dilaudid as well as oxycodone for pain, the patient states that her pain has not improved. Discussed with Dr. Jonelle Sidle, who has recommended OB monitoring. Pt will be admitted for treatment of pain crisis.   Sharlett Iles, MD 07/13/15 [id number]

## 2015-07-14 DIAGNOSIS — K5909 Other constipation: Secondary | ICD-10-CM

## 2015-07-14 MED ORDER — DIPHENHYDRAMINE HCL 25 MG PO CAPS: 25.0000 mg | ORAL_CAPSULE | Freq: Four times a day (QID) | ORAL | Status: DC | PRN

## 2015-07-14 MED ORDER — HYDROMORPHONE 1 MG/ML IV SOLN
INTRAVENOUS | Status: DC
  Administered 2015-07-14: 2.5 mg via INTRAVENOUS
  Administered 2015-07-14: 1.5 mg via INTRAVENOUS
  Administered 2015-07-14: 2.1 mg via INTRAVENOUS
  Administered 2015-07-14: 1.3 mg via INTRAVENOUS
  Administered 2015-07-15: 13:00:00 via INTRAVENOUS
  Administered 2015-07-15: 4 mg via INTRAVENOUS
  Administered 2015-07-15: 3 mg via INTRAVENOUS
  Administered 2015-07-15: 6 mg via INTRAVENOUS
  Administered 2015-07-15: 1.5 mg via INTRAVENOUS
  Administered 2015-07-15: 2.5 mg via INTRAVENOUS
  Administered 2015-07-16: 4 mg via INTRAVENOUS
  Administered 2015-07-16: 3.5 mg via INTRAVENOUS
  Administered 2015-07-16: 1.5 mg via INTRAVENOUS
  Administered 2015-07-16: 6 mg via INTRAVENOUS
  Administered 2015-07-16: 17:00:00 via INTRAVENOUS
  Administered 2015-07-16: 0.5 mg via INTRAVENOUS
  Administered 2015-07-17: 2.5 mg via INTRAVENOUS
  Administered 2015-07-17: 23:00:00 via INTRAVENOUS
  Administered 2015-07-17: 2 mg via INTRAVENOUS
  Administered 2015-07-17: 7 mg via INTRAVENOUS
  Administered 2015-07-17: 3 mg via INTRAVENOUS
  Administered 2015-07-17: 5 mg via INTRAVENOUS
  Administered 2015-07-17: 0.5 mg via INTRAVENOUS
  Administered 2015-07-17: 1 mg via INTRAVENOUS
  Administered 2015-07-18: 3 mg via INTRAVENOUS
  Administered 2015-07-18: 3.5 mg via INTRAVENOUS
  Administered 2015-07-18: 4.5 mg via INTRAVENOUS
  Administered 2015-07-18: 3 mg via INTRAVENOUS
  Filled 2015-07-14 (×3): qty 25

## 2015-07-14 MED ORDER — NALOXONE HCL 0.4 MG/ML IJ SOLN: 0.4000 mg | INTRAMUSCULAR | Status: DC | PRN

## 2015-07-14 MED ORDER — ONDANSETRON HCL 4 MG/2ML IJ SOLN
4.0000 mg | Freq: Four times a day (QID) | INTRAMUSCULAR | Status: DC | PRN
Start: 2015-07-14 — End: 2015-07-18
  Administered 2015-07-14 – 2015-07-18 (×5): 4 mg via INTRAVENOUS
  Filled 2015-07-14 (×5): qty 2

## 2015-07-14 MED ORDER — SODIUM CHLORIDE 0.9 % IJ SOLN: 9.0000 mL | INTRAMUSCULAR | Status: DC | PRN

## 2015-07-14 NOTE — Progress Notes (Signed)
SICKLE CELL SERVICE PROGRESS NOTE  Gabriella Griffin [id number] DOB: Jun 11, 1991 DOA: 07/13/2015 PCP: Sabino Snipes, PA-C  Assessment/Plan: Active Problems:   Sickle cell anemia with pain ([place])  1. Hb SS with crisis: Pt reports pain still in back and legs. Using PCA minimally 6.9 mg in last 17 hours, as she is afraid of opiate effect on fetus. Reviewed use of PCA and risk to fetus. Will continue PCA at weight based dose. Continue IVF at current dose as patient states still not drinking at her usual rate. Will reassess for transition to oral analgesics tomorrow. 2. IUP: [redacted] weeks gestational age. Ob rapid response team to evaluate with NST today. 3. Constipation: Last BM 2 days ago. Will give miralax today. 4. Anemia of chronic disease: Hb at goal of > 8g/dl during pregnancy.   Code Status: Full Code Family Communication: N/A Disposition Plan: Not yet ready for discharge  [place].  Pager [phone]. If 7PM-7AM, please contact night-coverage.  07/14/2015, 11:59 AM  LOS: 1 day   Interim  History: Pt reports pain still in back and legs at level of 6/10 compared to baseline of 2-3/10. Using PCA minimally as she is afraid of opiate effect on fetus.  Consultants:  Ob-Gyn  Procedures:  None  Antibiotics:  None     Objective: Filed Vitals:   07/14/15 0528 07/14/15 0545 07/14/15 0800 07/14/15 1030  BP: 97/51   107/61  Pulse: 82 80  83  Temp: 98.5 F (36.9 C)   98.6 F (37 C)  TempSrc: Oral   Oral  Resp: 25 19 19 18   SpO2: 91% 90% 91% 92%   Weight change:   Intake/Output Summary (Last 24 hours) at 07/14/15 1159 Last data filed at 07/14/15 0545  Gross per 24 hour  Intake 900.38 ml  Output      0 ml  Net 900.38 ml    General: Alert, awake, oriented x3, in no acute distress.  HEENT: [place]/AT PEERL, EOMI, anicteric Neck: Trachea midline,  no masses, no thyromegal,y no JVD, no carotid bruit OROPHARYNX:  Moist, No exudate/ erythema/lesions.  Heart:  Regular rate and rhythm, without murmurs, rubs, gallops, PMI non-displaced, no heaves or thrills on palpation.  Lungs: Clear to auscultation, no wheezing or rhonchi noted. No increased vocal fremitus resonant to percussion  Abdomen: Soft, nontender, nondistended, positive bowel sounds, no masses no hepatosplenomegaly noted.  Neuro: No focal neurological deficits noted cranial nerves II through XII grossly intact.  Strength at baseline in bilateral upper and lower extremities. Musculoskeletal: No warm swelling or erythema around joints, no spinal tenderness noted. Psychiatric: Patient alert and oriented x3, good insight and cognition, good recent to remote recall.    Data Reviewed: Basic Metabolic Panel:  Recent Labs Lab 07/11/15 0803 07/13/15 1116  NA 138 137  K 3.2* 3.4*  CL 108 109  CO2 23 21*  GLUCOSE 85 85  BUN <5* <5*  CREATININE <0.30* <0.30*  CALCIUM 8.3* 8.2*   Liver Function Tests:  Recent Labs Lab 07/11/15 0803  AST 32  ALT 13*  ALKPHOS 107  BILITOT 3.3*  PROT 6.3*  ALBUMIN 2.6*    Recent Labs Lab 07/11/15 0803  LIPASE 22   No results for input(s): AMMONIA in the last 168 hours. CBC:  Recent Labs Lab 07/11/15 0803 07/13/15 1116  WBC 12.1* 14.8*  NEUTROABS 8.5* 12.6*  HGB 7.8* 8.1*  HCT 21.7* 22.9*  MCV 107.4* 108.0*  PLT 361 415*   Cardiac Enzymes: No results for input(s): CKTOTAL,  CKMB, CKMBINDEX, TROPONINI in the last 168 hours. BNP (last 3 results) No results for input(s): BNP in the last 8760 hours.  ProBNP (last 3 results) No results for input(s): PROBNP in the last 8760 hours.  CBG: No results for input(s): GLUCAP in the last 168 hours.  No results found for this or any previous visit (from the past 240 hour(s)).   Studies: Dg Chest 2 View  07/13/2015  CLINICAL DATA:  24 year old female with history of sickle cell disease presenting with sickle cell pain crisis today. Diffuse body aches. EXAM: CHEST  2 VIEW COMPARISON:  Chest  x-ray 06/11/2015. FINDINGS: Mild scarring in the right middle lobe, similar to the prior examination. No acute consolidative airspace disease. No pleural effusions. No evidence of pulmonary edema. Mild cardiomegaly. Upper mediastinal contours are within normal limits. 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 Surgical clips in the right upper quadrant of the abdomen, likely from prior cholecystectomy. IMPRESSION: 1. No radiographic evidence of acute cardiopulmonary disease. 2. Mild scarring in the right middle lobe is unchanged. 3. Mild cardiomegaly. Electronically Signed   By: Vinnie Langton M.D.   On: 07/13/2015 12:21   [place] Mfm Ob Follow Up  07/04/2015  OBSTETRICAL ULTRASOUND: This exam was performed within a [facility]. The OB US report was generated in the AS system, and faxed to the ordering physician.  This report is available in the [facility]. See the [facility] Obstetric US report via the Image Link.   Scheduled Meds: . enoxaparin (LOVENOX) injection  30 mg Subcutaneous Q24H  . folic acid  2 mg Oral Daily  . HYDROmorphone   Intravenous 6 times per day  . multivitamin-prenatal  1 tablet Oral q morning - 10a  . senna-docusate  1 tablet Oral BID   Continuous Infusions: . dextrose 5 % and 0.45% NaCl 75 mL/hr at 07/14/15 0824    Time spent 30 minutes.

## 2015-07-14 NOTE — Progress Notes (Signed)
   07/14/15 1100  Clinical Encounter Type  Visited With Patient  Visit Type Initial;Social support;Spiritual support  Referral From Other (Comment)  Consult/Referral To None  Recommendations Continued social support as needed  Spiritual Encounters  Spiritual Needs Emotional;Grief support  Stress Factors  Patient Stress Factors Exhausted;Family relationships;Health changes;Major life changes  Family Stress Factors Major life changes   Counseling intern provided social and emotional support to pt using CBT and [place]ian interventions. Pt was oriented x4 with appropriate affect. The pt discussed her psychosocial history, including her strained relationship with her mother, her lack of relationship with her father, and her close relationship with her younger sister, who also has Sickle Cell. The pt discussed feelings of sadness related to her diagnosis and concerns related to her child growing up without a mother or seeing her mother very ill. The pt discussed her pregnancy and how she wants to raise her child differently than she was raised by her mother. Pt discussed current and previous relationships and the importance they play in her physical and mental health. Pt also discussed prior suicide attempt in 2014 and how she came to make positive meaning out of the experience.   Counseling intern will follow up with pt on Wednesday if pt is still in [place].   Duffy Rhody Counseling Intern

## 2015-07-14 NOTE — Progress Notes (Signed)
Patient denies feeling any abdominal pain or cramping or contractions.  Denies vaginal bleeding or leaking of fluids.  Reports good fetal movements.

## 2015-07-14 NOTE — Plan of Care (Signed)
Problem: Phase I Progression Outcomes Goal: Pain controlled with appropriate interventions Outcome: Progressing Patient reports pain 3/10 this am

## 2015-07-15 LAB — BASIC METABOLIC PANEL
ANION GAP: 5 (ref 5–15)
BUN: <5 mg/dL — ABNORMAL LOW (ref 6–20)
CALCIUM: 8.2 mg/dL — AB (ref 8.9–10.3)
CO2: 26 mmol/L (ref 22–32)
Chloride: 106 mmol/L (ref 101–111)
Creatinine, Ser: 0.31 mg/dL — ABNORMAL LOW (ref 0.44–1.00)
Glucose, Bld: 106 mg/dL — ABNORMAL HIGH (ref 65–99)
Potassium: 3.5 mmol/L (ref 3.5–5.1)
Sodium: 137 mmol/L (ref 135–145)

## 2015-07-15 LAB — CBC WITH DIFFERENTIAL/PLATELET
Basophils Absolute: 0.1 K/uL (ref 0.0–0.1)
Basophils Relative: 1 %
Eosinophils Absolute: 0.1 K/uL (ref 0.0–0.7)
Eosinophils Relative: 1 %
HCT: 19.6 % — ABNORMAL LOW (ref 36.0–46.0)
Hemoglobin: 7.1 g/dL — ABNORMAL LOW (ref 12.0–15.0)
Lymphocytes Relative: 19 %
Lymphs Abs: 1.7 K/uL (ref 0.7–4.0)
MCH: 38.4 pg — ABNORMAL HIGH (ref 26.0–34.0)
MCHC: 36.2 g/dL — ABNORMAL HIGH (ref 30.0–36.0)
MCV: 105.9 fL — ABNORMAL HIGH (ref 78.0–100.0)
Monocytes Absolute: 1.3 K/uL — ABNORMAL HIGH (ref 0.1–1.0)
Monocytes Relative: 14 %
Neutro Abs: 5.8 K/uL (ref 1.7–7.7)
Neutrophils Relative %: 65 %
Platelets: 391 K/uL (ref 150–400)
RBC: 1.85 MIL/uL — ABNORMAL LOW (ref 3.87–5.11)
RDW: 22.2 % — ABNORMAL HIGH (ref 11.5–15.5)
WBC: 9 K/uL (ref 4.0–10.5)
nRBC: 56 /100{WBCs} — ABNORMAL HIGH

## 2015-07-15 MED ORDER — OXYCODONE HCL 5 MG PO TABS
5.0000 mg | ORAL_TABLET | ORAL | Status: DC
  Administered 2015-07-15 – 2015-07-17 (×11): 5 mg via ORAL
  Filled 2015-07-15 (×11): qty 1

## 2015-07-15 NOTE — Progress Notes (Signed)
Fhr 125 with 10x10 accels, one 15x15, moderate variability and no decels. 2 uc's seen with ui, pt reports no pain and unaware of contrations at this time; no vaginal bleeding or leaking of fluid; pt reports feeling baby move this morning.

## 2015-07-15 NOTE — Progress Notes (Signed)
SICKLE CELL SERVICE PROGRESS NOTE  Gabriella Griffin [id number] DOB: May 31, 1991 DOA: 07/13/2015 PCP: Sabino Snipes, PA-C  Assessment/Plan: Active Problems:   Sickle cell anemia with pain ([place])  1. Hb SS with crisis: Pt reports pain still in back and legs.  Will schedule Oxycodone 5 mg every 4 hours and continue PCA at weight based dose for PRN use. Decrease IVF to [facility]  2. IUP: [redacted] weeks gestational age. Ob rapid response team to evaluate with NST today. 3. Constipation: Last BM 2 days ago. Will give miralax today. 4. Anemia of chronic disease: Hb at goal of > 8g/dl during pregnancy.   Code Status: Full Code Family Communication: N/A Disposition Plan: Not yet ready for discharge  [place].  Pager [phone]. If 7PM-7AM, please contact night-coverage.  07/15/2015, 1:14 PM  LOS: 2 days   Interim  History: Pt reports pain still in back and legs at level of 5/10 compared to baseline of 2-3/10. Using PCA minimally as she is afraid of opiate effect on fetus.  Consultants:  Ob-Gyn  Procedures:  None  Antibiotics:  None     Objective: Filed Vitals:   07/15/15 0809 07/15/15 1035 07/15/15 1210 07/15/15 1250  BP:  112/68  102/65  Pulse:  88  86  Temp:  98.8 F (37.1 C)  98.3 F (36.8 C)  TempSrc:  Oral  Oral  Resp: 18 18 19 18   SpO2: 95% 96% 95% 96%   Weight change:   Intake/Output Summary (Last 24 hours) at 07/15/15 1314 Last data filed at 07/15/15 0810  Gross per 24 hour  Intake 3567.75 ml  Output    500 ml  Net 3067.75 ml    General: Alert, awake, oriented x3, in no acute distress.  HEENT: [place]/AT PEERL, EOMI, anicteric OROPHARYNX:  Moist, No exudate/ erythema/lesions.  Heart: Regular rate and rhythm, without murmurs, rubs, gallops, PMI non-displaced, no heaves or thrills on palpation.  Lungs: Clear to auscultation, no wheezing or rhonchi noted. No increased vocal fremitus resonant to percussion  Abdomen: Soft, nontender, nondistended,  positive bowel sounds, no masses no hepatosplenomegaly noted.  Neuro: No focal neurological deficits noted cranial nerves II through XII grossly intact.  Strength at baseline in bilateral upper and lower extremities. Musculoskeletal: No warm swelling or erythema around joints, no spinal tenderness noted. Psychiatric: Patient alert and oriented x3, good insight and cognition, good recent to remote recall.    Data Reviewed: Basic Metabolic Panel:  Recent Labs Lab 07/11/15 0803 07/13/15 1116 07/15/15 1025  NA 138 137 137  K 3.2* 3.4* 3.5  CL 108 109 106  CO2 23 21* 26  GLUCOSE 85 85 106*  BUN <5* <5* <5*  CREATININE <0.30* <0.30* 0.31*  CALCIUM 8.3* 8.2* 8.2*   Liver Function Tests:  Recent Labs Lab 07/11/15 0803  AST 32  ALT 13*  ALKPHOS 107  BILITOT 3.3*  PROT 6.3*  ALBUMIN 2.6*    Recent Labs Lab 07/11/15 0803  LIPASE 22   No results for input(s): AMMONIA in the last 168 hours. CBC:  Recent Labs Lab 07/11/15 0803 07/13/15 1116 07/15/15 1025  WBC 12.1* 14.8* 9.0  NEUTROABS 8.5* 12.6* 5.8  HGB 7.8* 8.1* 7.1*  HCT 21.7* 22.9* 19.6*  MCV 107.4* 108.0* 105.9*  PLT 361 415* 391   Cardiac Enzymes: No results for input(s): CKTOTAL, CKMB, CKMBINDEX, TROPONINI in the last 168 hours. BNP (last 3 results) No results for input(s): BNP in the last 8760 hours.  ProBNP (last 3 results) No results  for input(s): PROBNP in the last 8760 hours.  CBG: No results for input(s): GLUCAP in the last 168 hours.  No results found for this or any previous visit (from the past 240 hour(s)).   Studies: Dg Chest 2 View  07/13/2015  CLINICAL DATA:  24 year old female with history of sickle cell disease presenting with sickle cell pain crisis today. Diffuse body aches. EXAM: CHEST  2 VIEW COMPARISON:  Chest x-ray 06/11/2015. FINDINGS: Mild scarring in the right middle lobe, similar to the prior examination. No acute consolidative airspace disease. No pleural effusions. No  evidence of pulmonary edema. Mild cardiomegaly. Upper mediastinal contours are within normal limits. 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 Surgical clips in the right upper quadrant of the abdomen, likely from prior cholecystectomy. IMPRESSION: 1. No radiographic evidence of acute cardiopulmonary disease. 2. Mild scarring in the right middle lobe is unchanged. 3. Mild cardiomegaly. Electronically Signed   By: Vinnie Langton M.D.   On: 07/13/2015 12:21   [place] Mfm Ob Follow Up  07/04/2015  OBSTETRICAL ULTRASOUND: This exam was performed within a [facility]. The OB US report was generated in the AS system, and faxed to the ordering physician.  This report is available in the [facility]. See the AS Obstetric US report via the Image Link.   Scheduled Meds: . enoxaparin (LOVENOX) injection  30 mg Subcutaneous Q24H  . folic acid  2 mg Oral Daily  . HYDROmorphone   Intravenous 6 times per day  . multivitamin-prenatal  1 tablet Oral q morning - 10a  . oxyCODONE  5 mg Oral Q4H  . senna-docusate  1 tablet Oral BID   Continuous Infusions: . dextrose 5 % and 0.45% NaCl 1,000 mL (07/15/15 0800)    Time spent 25 minutes.

## 2015-07-16 DIAGNOSIS — J069 Acute upper respiratory infection, unspecified: Secondary | ICD-10-CM

## 2015-07-16 DIAGNOSIS — D5700 Hb-SS disease with crisis, unspecified: Secondary | ICD-10-CM | POA: Diagnosis not present

## 2015-07-16 MED ORDER — AZITHROMYCIN 250 MG PO TABS
250.0000 mg | ORAL_TABLET | Freq: Every day | ORAL | Status: DC
  Administered 2015-07-17 – 2015-07-18 (×2): 250 mg via ORAL
  Filled 2015-07-16 (×2): qty 1

## 2015-07-16 MED ORDER — AZITHROMYCIN 250 MG PO TABS
500.0000 mg | ORAL_TABLET | Freq: Once | ORAL | Status: AC
  Administered 2015-07-16: 500 mg via ORAL
  Filled 2015-07-16: qty 2

## 2015-07-16 MED ORDER — FUROSEMIDE 20 MG PO TABS
20.0000 mg | ORAL_TABLET | Freq: Once | ORAL | Status: AC
  Administered 2015-07-16: 20 mg via ORAL
  Filled 2015-07-16: qty 1

## 2015-07-16 MED ORDER — ALBUTEROL SULFATE (2.5 MG/3ML) 0.083% IN NEBU
2.5000 mg | INHALATION_SOLUTION | Freq: Four times a day (QID) | RESPIRATORY_TRACT | Status: DC | PRN
  Administered 2015-07-16 – 2015-07-17 (×2): 2.5 mg via RESPIRATORY_TRACT
  Filled 2015-07-16 (×2): qty 3

## 2015-07-16 NOTE — Progress Notes (Signed)
SICKLE CELL SERVICE PROGRESS NOTE  Gabriella Griffin [id number] DOB: 1991-06-15 DOA: 07/13/2015 PCP: Sabino Snipes, PA-C  Assessment/Plan: Active Problems:   Sickle cell anemia with pain ([place])  1. Wheezing and cough: Pt developed wheezing and cough overnight. She has a mildly elevated temperature. I suspect that this may be partially due to fluids and possible URI. Will decrease IVF to [facility] and give a small dose of Lasix. Also treat with Azithromycin and albuterol for wheezing.  2. Hb SS with crisis: Pt reports pain still intermittantly in back at an intensity of 6/10.  Will continue Oxycodone 5 mg every 4 hours and continue PCA at weight based dose for PRN use.  3. IUP: [redacted] weeks gestational age. Ob rapid response team to evaluate with NST today. 4. Constipation: Last BM 2 days ago. Will give miralax today. 5. Anemia of chronic disease: Hb at goal of > 8g/dl during pregnancy.   Code Status: Full Code Family Communication: N/A Disposition Plan: Not yet ready for discharge  [place].  Pager [phone]. If 7PM-7AM, please contact night-coverage.  07/16/2015, 11:48 AM  LOS: 3 days   Interim  History: Pt reports pain still in back at level of 5/10 compared to baseline of 2-3/10. Still using PCA minimally as she is afraid of opiate effect on fetus.  Consultants:  Ob-Gyn  Procedures:  None  Antibiotics:  None     Objective: Filed Vitals:   07/16/15 0551 07/16/15 0723 07/16/15 0946 07/16/15 1048  BP: 120/68  121/76   Pulse: 85  88   Temp: 98.6 F (37 C)  99.5 F (37.5 C)   TempSrc: Oral  Oral   Resp: 19 21 21 21   Weight: 131 lb 6.4 oz (59.603 kg)     SpO2: 94% 95% 95% 96%   Weight change:   Intake/Output Summary (Last 24 hours) at 07/16/15 1148 Last data filed at 07/16/15 0552  Gross per 24 hour  Intake    780 ml  Output   1200 ml  Net   -420 ml    General: Alert, awake, oriented x3, in no acute distress.  HEENT: [place]/AT PEERL, EOMI,  anicteric OROPHARYNX:  Moist, No exudate/ erythema/lesions.  Heart: Regular rate and rhythm, without murmurs, rubs, gallops, PMI non-displaced, no heaves or thrills on palpation.  Lungs: Expiratory wheezing noted.(+) non productive cough noted. No increased vocal fremitus resonant to percussion  Abdomen: Gravid abdomen Neuro: No focal neurological deficits noted cranial nerves II through XII grossly intact.  Strength at baseline in bilateral upper and lower extremities. Musculoskeletal: No warm swelling or erythema around joints, no spinal tenderness noted. Psychiatric: Patient alert and oriented x3, good insight and cognition, good recent to remote recall.    Data Reviewed: Basic Metabolic Panel:  Recent Labs Lab 07/11/15 0803 07/13/15 1116 07/15/15 1025  NA 138 137 137  K 3.2* 3.4* 3.5  CL 108 109 106  CO2 23 21* 26  GLUCOSE 85 85 106*  BUN <5* <5* <5*  CREATININE <0.30* <0.30* 0.31*  CALCIUM 8.3* 8.2* 8.2*   Liver Function Tests:  Recent Labs Lab 07/11/15 0803  AST 32  ALT 13*  ALKPHOS 107  BILITOT 3.3*  PROT 6.3*  ALBUMIN 2.6*    Recent Labs Lab 07/11/15 0803  LIPASE 22   No results for input(s): AMMONIA in the last 168 hours. CBC:  Recent Labs Lab 07/11/15 0803 07/13/15 1116 07/15/15 1025  WBC 12.1* 14.8* 9.0  NEUTROABS 8.5* 12.6* 5.8  HGB 7.8* 8.1* 7.1*  HCT 21.7* 22.9* 19.6*  MCV 107.4* 108.0* 105.9*  PLT 361 415* 391   Cardiac Enzymes: No results for input(s): CKTOTAL, CKMB, CKMBINDEX, TROPONINI in the last 168 hours. BNP (last 3 results) No results for input(s): BNP in the last 8760 hours.  ProBNP (last 3 results) No results for input(s): PROBNP in the last 8760 hours.  CBG: No results for input(s): GLUCAP in the last 168 hours.  No results found for this or any previous visit (from the past 240 hour(s)).   Studies: Dg Chest 2 View  07/13/2015  CLINICAL DATA:  24 year old female with history of sickle cell disease presenting with  sickle cell pain crisis today. Diffuse body aches. EXAM: CHEST  2 VIEW COMPARISON:  Chest x-ray 06/11/2015. FINDINGS: Mild scarring in the right middle lobe, similar to the prior examination. No acute consolidative airspace disease. No pleural effusions. No evidence of pulmonary edema. Mild cardiomegaly. Upper mediastinal contours are within normal limits. 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 Surgical clips in the right upper quadrant of the abdomen, likely from prior cholecystectomy. IMPRESSION: 1. No radiographic evidence of acute cardiopulmonary disease. 2. Mild scarring in the right middle lobe is unchanged. 3. Mild cardiomegaly. Electronically Signed   By: Vinnie Langton M.D.   On: 07/13/2015 12:21   [place] Mfm Ob Follow Up  07/04/2015  OBSTETRICAL ULTRASOUND: This exam was performed within a [facility] Department. The OB US report was generated in the AS system, and faxed to the ordering physician.  This report is available in the [facility]. See the [facility] Obstetric US report via the Image Link.   Scheduled Meds: . azithromycin  500 mg Oral Once   Followed by  . [START ON 07/17/2015] azithromycin  250 mg Oral Daily  . enoxaparin (LOVENOX) injection  30 mg Subcutaneous Q24H  . folic acid  2 mg Oral Daily  . furosemide  20 mg Oral Once  . HYDROmorphone   Intravenous 6 times per day  . multivitamin-prenatal  1 tablet Oral q morning - 10a  . oxyCODONE  5 mg Oral Q4H  . senna-docusate  1 tablet Oral BID   Continuous Infusions: . dextrose 5 % and 0.45% NaCl 1,000 mL (07/15/15 0800)    Time spent 25 minutes.

## 2015-07-16 NOTE — Progress Notes (Signed)
Pt reports positive fetal movement, no vaginal bleeding, or leaking of fluid, no contractions.

## 2015-07-17 DIAGNOSIS — D5700 Hb-SS disease with crisis, unspecified: Secondary | ICD-10-CM | POA: Diagnosis not present

## 2015-07-17 MED ORDER — HYDROMORPHONE HCL 1 MG/ML IJ SOLN
1.0000 mg | INTRAMUSCULAR | Status: DC | PRN
Start: 2015-07-17 — End: 2015-07-18

## 2015-07-17 MED ORDER — OXYCODONE HCL 5 MG PO TABS
10.0000 mg | ORAL_TABLET | ORAL | Status: DC
  Administered 2015-07-17 – 2015-07-18 (×6): 10 mg via ORAL
  Filled 2015-07-17 (×7): qty 2

## 2015-07-17 NOTE — Progress Notes (Signed)
SICKLE CELL SERVICE PROGRESS NOTE  Gabriella Griffin [id number] DOB: 27-Aug-1990 DOA: 07/13/2015 PCP: Sabino Snipes, PA-C  Assessment/Plan: Active Problems:   Sickle cell anemia with pain ([place])  1. Wheezing and cough: Currently she has no wheezing and reports that she feels as though her chest has "opened up". I will continue Azithromycin and Albuterol PRN.  2. Hb SS with crisis: Pt states that her goal is to be pain free. I discussed that we needed to re-assess the goal as the goal is for management with oral analgesics in the setting of improved function. She has a fear of using oral analgesics thus expects hospitalization until "the pain goes away". The new goal is to be able to manage her pain with oral analgesics. To that end. I will increase Oxycodone to 10 mg every 4 hours with her ambulatory goal to taper medications back down to 5 mg every 4 hours as needed. Anticipate discharge home tomorrow. 3. IUP: [redacted] weeks gestational age. Ob rapid response team to evaluate with NST today. 4. Constipation: Last BM today. Continue Miralax on PRN basis. 5. Anemia of chronic disease: Hb at goal of > 8g/dl during pregnancy.   Code Status: Full Code Family Communication: N/A Disposition Plan: Not yet ready for discharge  [place].  Pager [phone]. If 7PM-7AM, please contact night-coverage.  07/17/2015, 1:29 PM  LOS: 4 days   Interim  History: Pt reports pain in right [place] at level of 5/10 compared to baseline of 2-3/10.  Consultants:  Ob-Gyn  Procedures:  None  Antibiotics:  None     Objective: Filed Vitals:   07/17/15 0445 07/17/15 0603 07/17/15 1047 07/17/15 1051  BP:  123/65 111/47   Pulse:  97 89   Temp:  98.5 F (36.9 C) 97.8 F (36.6 C)   TempSrc:  Oral Oral   Resp: 18 20 21 21   Weight:      SpO2:  97% 98% 96%   Weight change:   Intake/Output Summary (Last 24 hours) at 07/17/15 1329 Last data filed at 07/17/15 1006  Gross per 24 hour   Intake   1380 ml  Output   1850 ml  Net   -470 ml    General: Alert, awake, oriented x3, in no acute distress.  HEENT: [place]/AT PEERL, EOMI, anicteric OROPHARYNX:  Moist, No exudate/ erythema/lesions.  Heart: Regular rate and rhythm, without murmurs, rubs, gallops, PMI non-displaced, no heaves or thrills on palpation.  Lungs: Expiratory wheezing noted.(+) non productive cough noted. No increased vocal fremitus resonant to percussion  Abdomen: Gravid abdomen Neuro: No focal neurological deficits noted cranial nerves II through XII grossly intact.  Strength at baseline in bilateral upper and lower extremities. Musculoskeletal: No warm swelling or erythema around joints, no spinal tenderness noted. Psychiatric: Patient alert and oriented x3, good insight and cognition, good recent to remote recall.    Data Reviewed: Basic Metabolic Panel:  Recent Labs Lab 07/11/15 0803 07/13/15 1116 07/15/15 1025  NA 138 137 137  K 3.2* 3.4* 3.5  CL 108 109 106  CO2 23 21* 26  GLUCOSE 85 85 106*  BUN <5* <5* <5*  CREATININE <0.30* <0.30* 0.31*  CALCIUM 8.3* 8.2* 8.2*   Liver Function Tests:  Recent Labs Lab 07/11/15 0803  AST 32  ALT 13*  ALKPHOS 107  BILITOT 3.3*  PROT 6.3*  ALBUMIN 2.6*    Recent Labs Lab 07/11/15 0803  LIPASE 22   No results for input(s): AMMONIA in the last 168 hours. CBC:  Recent Labs Lab 07/11/15 0803 07/13/15 1116 07/15/15 1025  WBC 12.1* 14.8* 9.0  NEUTROABS 8.5* 12.6* 5.8  HGB 7.8* 8.1* 7.1*  HCT 21.7* 22.9* 19.6*  MCV 107.4* 108.0* 105.9*  PLT 361 415* 391   Cardiac Enzymes: No results for input(s): CKTOTAL, CKMB, CKMBINDEX, TROPONINI in the last 168 hours. BNP (last 3 results) No results for input(s): BNP in the last 8760 hours.  ProBNP (last 3 results) No results for input(s): PROBNP in the last 8760 hours.  CBG: No results for input(s): GLUCAP in the last 168 hours.  No results found for this or any previous visit (from the past  240 hour(s)).   Studies: Dg Chest 2 View  07/13/2015  CLINICAL DATA:  24 year old female with history of sickle cell disease presenting with sickle cell pain crisis today. Diffuse body aches. EXAM: CHEST  2 VIEW COMPARISON:  Chest x-ray 06/11/2015. FINDINGS: Mild scarring in the right middle lobe, similar to the prior examination. No acute consolidative airspace disease. No pleural effusions. No evidence of pulmonary edema. Mild cardiomegaly. Upper mediastinal contours are within normal limits. 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 Surgical clips in the right upper quadrant of the abdomen, likely from prior cholecystectomy. IMPRESSION: 1. No radiographic evidence of acute cardiopulmonary disease. 2. Mild scarring in the right middle lobe is unchanged. 3. Mild cardiomegaly. Electronically Signed   By: Vinnie Langton M.D.   On: 07/13/2015 12:21   [place] Mfm Ob Follow Up  07/04/2015  OBSTETRICAL ULTRASOUND: This exam was performed within a [facility] Ultrasound Department. The OB US report was generated in the AS system, and faxed to the ordering physician.  This report is available in the [facility]. See the [facility] Obstetric US report via the Image Link.   Scheduled Meds: . azithromycin  250 mg Oral Daily  . enoxaparin (LOVENOX) injection  30 mg Subcutaneous Q24H  . folic acid  2 mg Oral Daily  . HYDROmorphone   Intravenous 6 times per day  . multivitamin-prenatal  1 tablet Oral q morning - 10a  . oxyCODONE  10 mg Oral Q4H  . senna-docusate  1 tablet Oral BID   Continuous Infusions: . dextrose 5 % and 0.45% NaCl 10 mL/hr at 07/16/15 1126    Time spent 25 minutes.

## 2015-07-17 NOTE — Progress Notes (Signed)
Pt denies vaginal bleeding, leaking of fluid, or uc's. Says she had some mild cramping this morning, had a bowel movement and now denies any discomfort. Reports positive fetal movement.

## 2015-07-18 DIAGNOSIS — J069 Acute upper respiratory infection, unspecified: Secondary | ICD-10-CM | POA: Diagnosis not present

## 2015-07-18 DIAGNOSIS — D5700 Hb-SS disease with crisis, unspecified: Secondary | ICD-10-CM | POA: Diagnosis not present

## 2015-07-18 MED ORDER — ALBUTEROL SULFATE HFA 108 (90 BASE) MCG/ACT IN AERS
1.0000 | INHALATION_SPRAY | Freq: Four times a day (QID) | RESPIRATORY_TRACT | Status: DC | PRN
  Filled 2015-07-18: qty 6.7

## 2015-07-18 MED ORDER — AZITHROMYCIN 250 MG PO TABS: 250.0000 mg | ORAL_TABLET | Freq: Every day | ORAL | Status: DC

## 2015-07-18 MED ORDER — HYDROMORPHONE HCL 1 MG/ML IJ SOLN: 1.0000 mg | INTRAMUSCULAR | Status: DC | PRN

## 2015-07-18 MED ORDER — OXYCODONE HCL 5 MG PO TABS: ORAL_TABLET | ORAL | Status: DC

## 2015-07-18 NOTE — Discharge Summary (Signed)
Gabriella Griffin MRN: [id number] DOB/AGE: 1990/09/03 24 y.o.  Admit date: 07/13/2015 Discharge date: 07/18/2015  Primary Care Physician:  No primary care provider on file.   Discharge Diagnoses:   Patient Active Problem List   Diagnosis Date Noted  . Sickle cell anemia with pain ([place]) 07/13/2015  . Sickle cell crisis ([place]) 06/11/2015  . 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 Leukocytosis 06/11/2015  . Hereditary disease in family possibly affecting fetus, affecting management of mother, antepartum condition or complication [id number]  . Sickle cell pain crisis ([place]) 02/07/2015  . Sickle cell anemia (HCC)   . Supervision of high-risk pregnancy 02/04/2015  . Maternal sickle cell anemia complicating pregnancy ([place]) 02/04/2015    DISCHARGE MEDICATION:   Medication List    TAKE these medications        acetaminophen 500 MG tablet  Commonly known as:  TYLENOL  Take 1,000 mg by mouth every 6 (six) hours as needed for mild pain.     azithromycin 250 MG tablet  Commonly known as:  ZITHROMAX  Take 1 tablet (250 mg total) by mouth daily.     folic acid 1 MG tablet  Commonly known as:  FOLVITE  Take 2 tablets (2 mg total) by mouth daily.     multivitamin-prenatal 27-0.8 MG Tabs tablet  Take 1 tablet by mouth every morning.     oxyCODONE 5 MG immediate release tablet  Commonly known as:  Oxy IR/ROXICODONE  Take 1 -2 tabs (one tab if pain <7/10 and 2 tabs if pain >7/10) Q6 Hours PRN pain.          Consults:     SIGNIFICANT DIAGNOSTIC STUDIES:  Dg Chest 2 View  07/13/2015  CLINICAL DATA:  24 year old female with history of sickle cell disease presenting with sickle cell pain crisis today. Diffuse body aches. EXAM: CHEST  2 VIEW COMPARISON:  Chest x-ray 06/11/2015. FINDINGS: Mild scarring in the right middle lobe, similar to the prior examination. No acute consolidative airspace disease. No pleural effusions.  No evidence of pulmonary edema. Mild cardiomegaly. Upper mediastinal contours are within normal limits. 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 Surgical clips in the right upper quadrant of the abdomen, likely from prior cholecystectomy. IMPRESSION: 1. No radiographic evidence of acute cardiopulmonary disease. 2. Mild scarring in the right middle lobe is unchanged. 3. Mild cardiomegaly. Electronically Signed   By: Vinnie Langton M.D.   On: 07/13/2015 12:21   [place] [facility] Ob Follow Up  07/04/2015  OBSTETRICAL ULTRASOUND: This exam was performed within a [facility]. The OB US report was generated in the AS system, and faxed to the ordering physician.  This report is available in the [facility]. See the AS Obstetric US report via the Image Link.     No results found for this or any previous visit (from the past 240 hour(s)).  BRIEF ADMITTING H & P: A 23 yo patient with history of sickle cell disease who is also [redacted] weeks pregnant admitted with sickle cell painful crisis. Patient has been having pain in her arms and legs and lower back rated as 9 out of 10. She was seen in the ER 2 days ago was treated and discharged home. She came back to the ER today with this pain. Pain is typical of her sickle cell. No associated nausea vomiting or diarrhea. Not relieved by her home medications. She has received  multiple doses of Dilaudid in the ER and still not getting better so she's been admitted to the hospital. Patient is [redacted] weeks pregnant and is being followed by OB/GYN. She has been evaluated by the OB rapid response team in the ER and the baby seems to be doing well. She has been cleared to be admitted to [facility] long for pain management.    Hospital Course:   Present on Admission:  Sickle cell anemia with pain [facility]): Pt with Hb SS admitted with pain crisis. She does not have a PMD and thus no analgesics except Tylenol which although  previously effective is no ineffective since she has had to be off Hydrea for her pregnancy. Her pain was managed with IV dilaudid via PCA. She was then transitioned to Oxycodone 10 mg every 4 hours. She is discharged home on Oxycodone 5-10 mg every 6 hours with parameters for taking wither 5 or 10 mg. An appointment has been arranged on 12/14/206 at the [facility].    Upper Respiratory Infection: Pt developed wheezing and non-productive cough but without fever. She was treated with Albuterol and Azithromycin. She is to complete 2 additional days of Azithromycin to complete course.   Intrauterine Pregnancy: Pt was followed during hospitalization by Ob-nursing with NST's.   Disposition and Follow-up:  Pt discharged home in good condition and is to follow up in the [facility] to establish care on 07/30/2015.      Discharge Instructions    Activity as tolerated - No restrictions    Complete by:  As directed      Diet general    Complete by:  As directed            DISCHARGE EXAM:  General: Alert, awake, oriented x3, inno apparent distress.  Vital Signs: BP 120/65, HR 83, T 98.4 F (36.9 C), temperature source Oral, RR 15, weight 131 lb 6.4 oz (59.603 kg), last menstrual period 12/04/2014, SpO2 90 %. HEENT: [place]/AT PEERL, EOMI, anicteric Neck: Trachea midline, no masses, no thyromegal,y no JVD, no carotid bruit OROPHARYNX: Moist, No exudate/ erythema/lesions.  Heart: Regular rate and rhythm, without murmurs, rubs, gallops or S3. PMI non-displaced. Exam reveals no decreased pulses. Pulmonary/Chest: Normal effort. Breath sounds normal. No. Apnea. Clear to auscultation,no stridor,  no wheezing and no rhonchi noted. No respiratory distress and no tenderness noted. Abdomen: Gravid Neuro: Alert and oriented to person, place and time. Normal motor skills, Displays no atrophy or tremors and exhibits normal muscle tone.  No focal neurological deficits noted cranial nerves II through XII grossly intact. No  sensory deficit noted. Strength at baseline in bilateral upper and lower extremities. Gait normal. Musculoskeletal: No warm swelling or erythema around joints, no spinal tenderness noted. Psychiatric: Patient alert and oriented x3, good insight and cognition, good recent to remote recall. Mood, memory, affect and judgement normal Lymph node survey: No cervical axillary or inguinal lymphadenopathy noted. Skin: Skin is warm and dry. No bruising, no ecchymosis and no rash noted. Pt is not diaphoretic. No erythema. No pallor    Total time spent including face to face and decision making was greater than 30 minutes  Signed: Maxwell Martorano A. 07/18/2015, 2:27 PM

## 2015-07-18 NOTE — Progress Notes (Signed)
Nursing Discharge Summary  Patient ID: Shatorria Ofarrell MRN: [id number] DOB/AGE: Nov 05, 1990 24 y.o.  Admit date: 07/13/2015 Discharge date: 07/18/2015  Discharged Condition: good  Disposition: 01-Home or Self Care  Follow-up Information    Follow up with [place] On 07/30/2015.   Why:  Follow up appointment at 11:15am.   Contact information:   [place] [phone]       Prescriptions Given: Patient given prescription for OxyIR.  Patient follow up appointments and medications discussed. Patient verbalized understanding without further questions.   Means of Discharge: patient to be taken downstairs via wheelchair to be discharged home.   Signed: Buel Ream 07/18/2015, 5:24 PM

## 2015-07-18 NOTE — Progress Notes (Signed)
Fhr reactive and reassuring, baseline 120 with moderate variability, 15x15 accels and no decels; one contraction seen with uterine irritability, pt was unaware of contraction; pt reports positive fetal movement, no vaginal bleeding or leaking of fluid; RROB went over preterm labor precautions, pt verbalized understanding

## 2015-07-30 ENCOUNTER — Ambulatory Visit: Payer: Medicaid Other | Admitting: Family Medicine

## 2015-08-01 ENCOUNTER — Ambulatory Visit (HOSPITAL_COMMUNITY)
Admission: RE | Admit: 2015-08-01 | Discharge: 2015-08-01 | Disposition: A | Discharge status: Home / Self Care | Payer: Self-pay | Source: Ambulatory Visit | Attending: Obstetrics & Gynecology | Admitting: Obstetrics & Gynecology

## 2015-08-01 ENCOUNTER — Other Ambulatory Visit (HOSPITAL_COMMUNITY): Payer: Self-pay | Admitting: Obstetrics and Gynecology

## 2015-08-01 ENCOUNTER — Encounter (HOSPITAL_COMMUNITY): Payer: Self-pay

## 2015-08-01 DIAGNOSIS — O99019 Anemia complicating pregnancy, unspecified trimester: Secondary | ICD-10-CM

## 2015-08-01 DIAGNOSIS — Z3A34 34 weeks gestation of pregnancy: Secondary | ICD-10-CM | POA: Insufficient documentation

## 2015-08-01 DIAGNOSIS — O99013 Anemia complicating pregnancy, third trimester: Secondary | ICD-10-CM | POA: Insufficient documentation

## 2015-08-01 DIAGNOSIS — D571 Sickle-cell disease without crisis: Secondary | ICD-10-CM | POA: Insufficient documentation

## 2015-08-06 ENCOUNTER — Ambulatory Visit (INDEPENDENT_AMBULATORY_CARE_PROVIDER_SITE_OTHER): Payer: Self-pay | Admitting: Advanced Practice Midwife

## 2015-08-06 VITALS — BP 130/80 | HR 81 | Temp 98.2°F | Wt 132.6 lb

## 2015-08-06 DIAGNOSIS — O2686 Pruritic urticarial papules and plaques of pregnancy (PUPPP): Secondary | ICD-10-CM

## 2015-08-06 DIAGNOSIS — Z113 Encounter for screening for infections with a predominantly sexual mode of transmission: Secondary | ICD-10-CM

## 2015-08-06 DIAGNOSIS — O99013 Anemia complicating pregnancy, third trimester: Secondary | ICD-10-CM

## 2015-08-06 DIAGNOSIS — O0993 Supervision of high risk pregnancy, unspecified, third trimester: Secondary | ICD-10-CM

## 2015-08-06 DIAGNOSIS — D571 Sickle-cell disease without crisis: Secondary | ICD-10-CM

## 2015-08-06 DIAGNOSIS — Z23 Encounter for immunization: Secondary | ICD-10-CM

## 2015-08-06 DIAGNOSIS — D5700 Hb-SS disease with crisis, unspecified: Secondary | ICD-10-CM

## 2015-08-06 LAB — POCT URINALYSIS DIP (DEVICE)
Glucose, UA: NEGATIVE mg/dL
KETONES UR: NEGATIVE mg/dL
NITRITE: NEGATIVE
Protein, ur: 100 mg/dL — AB
SPECIFIC GRAVITY, URINE: 1.025 (ref 1.005–1.030)
Urobilinogen, UA: 2 mg/dL — ABNORMAL HIGH (ref 0.0–1.0)
pH: 6.5 (ref 5.0–8.0)

## 2015-08-06 LAB — OB RESULTS CONSOLE GC/CHLAMYDIA: GC PROBE AMP, GENITAL: NEGATIVE

## 2015-08-06 NOTE — Patient Instructions (Signed)
Braxton Hicks Contractions °Contractions of the uterus can occur throughout pregnancy. Contractions are not always a sign that you are in labor.  °WHAT ARE BRAXTON HICKS CONTRACTIONS?  °Contractions that occur before labor are called Braxton Hicks contractions, or false labor. Toward the end of pregnancy (32-34 weeks), these contractions can develop more often and may become more forceful. This is not true labor because these contractions do not result in opening (dilatation) and thinning of the cervix. They are sometimes difficult to tell apart from true labor because these contractions can be forceful and people have different pain tolerances. You should not feel embarrassed if you go to the hospital with false labor. Sometimes, the only way to tell if you are in true labor is for your health care provider to look for changes in the cervix. °If there are no prenatal problems or other health problems associated with the pregnancy, it is completely safe to be sent home with false labor and await the onset of true labor. °HOW CAN YOU TELL THE DIFFERENCE BETWEEN TRUE AND FALSE LABOR? °False Labor °· The contractions of false labor are usually shorter and not as hard as those of true labor.   °· The contractions are usually irregular.   °· The contractions are often felt in the front of the lower abdomen and in the groin.   °· The contractions may go away when you walk around or change positions while lying down.   °· The contractions get weaker and are shorter lasting as time goes on.   °· The contractions do not usually become progressively stronger, regular, and closer together as with true labor.   °True Labor °· Contractions in true labor last 30-70 seconds, become very regular, usually become more intense, and increase in frequency.   °· The contractions do not go away with walking.   °· The discomfort is usually felt in the top of the uterus and spreads to the lower abdomen and low back.   °· True labor can be  determined by your health care provider with an exam. This will show that the cervix is dilating and getting thinner.   °WHAT TO REMEMBER °· Keep up with your usual exercises and follow other instructions given by your health care provider.   °· Take medicines as directed by your health care provider.   °· Keep your regular prenatal appointments.   °· Eat and drink lightly if you think you are going into labor.   °· If Braxton Hicks contractions are making you uncomfortable:   °¨ Change your position from lying down or resting to walking, or from walking to resting.   °¨ Sit and rest in a tub of warm water.   °¨ Drink 2-3 glasses of water. Dehydration may cause these contractions.   °¨ Do slow and deep breathing several times an hour.   °WHEN SHOULD I SEEK IMMEDIATE MEDICAL CARE? °Seek immediate medical care if: °· Your contractions become stronger, more regular, and closer together.   °· You have fluid leaking or gushing from your vagina.   °· You have a fever.   °· You pass blood-tinged mucus.   °· You have vaginal bleeding.   °· You have continuous abdominal pain.   °· You have low back pain that you never had before.   °· You feel your baby's head pushing down and causing pelvic pressure.   °· Your baby is not moving as much as it used to.   °  °This information is not intended to replace advice given to you by your health care provider. Make sure you discuss any questions you have with your health care   provider. °  °Document Released: 08/02/2005 Document Revised: 08/07/2013 Document Reviewed: 05/14/2013 °Elsevier Interactive Patient Education ©2016 Elsevier Inc. ° °

## 2015-08-06 NOTE — Progress Notes (Signed)
Pt c/o red, itchy rash on stomach

## 2015-08-06 NOTE — Progress Notes (Signed)
Subjective:  Tareva Leven is a 24 y.o. G1P0 at [redacted]w[redacted]d being seen today for ongoing prenatal care.  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  Patient reports no complaints.  Contractions: Irritability. Vag. Bleeding: None.  Movement: Present. Denies leaking of fluid.   The following portions of the patient's history were reviewed and updated as appropriate: allergies, current medications, past family history, past medical history, past social history, past surgical history and problem list. Problem list updated.  States she hasn't come to prenatal appointment since July 2016 because she didn't have any appointment schedule except for the ultrasound.  Objective:   Filed Vitals:   08/06/15 1058  BP: 130/80  Pulse: 81  Temp: 98.2 F (36.8 C)  Weight: 132 lb 9.6 oz (60.147 kg)    Fetal Status: Fetal Heart Rate (bpm): 156 Fundal Height: 35 cm Movement: Present  Presentation: Vertex  General:  Alert, oriented and cooperative. Patient is in no acute distress.  Skin: Skin is warm and dry. No rash noted.   Cardiovascular: Normal heart rate noted  Respiratory: Normal respiratory effort, no problems with respiration noted  Abdomen: Soft, gravid, appropriate for gestational age. Pain/Pressure: Present     Pelvic: Vag. Bleeding: None Vag D/C Character: White   Cervical exam performed Dilation: Closed Effacement (%): 0 Station: -3  Extremities: Normal range of motion.  Edema: None  Mental Status: Normal mood and affect. Normal behavior. Normal  judgment and thought content.   Urinalysis: Urine Protein: 2+ Urine Glucose: Negative  Growth [place] 08/01/15 normal NST reactive today  Assessment and Plan:  Pregnancy: G1P0 at [redacted]w[redacted]d  1. Sickle cell anemia with pain (HCC)  - Fetal nonstress test  2. Supervision of high-risk pregnancy, third trimester  - Culture, beta strep (group b only) - GC/Chlamydia probe amp ([place])not at [facility]  Preterm labor symptoms and general obstetric precautions including but not limited to vaginal bleeding, contractions, leaking of fluid and fetal movement were reviewed in detail with the patient. Please refer to After Visit Summary for other counseling recommendations.  1 hour GTT Review scheduled of care and testing especially in high-risk pregnancy and urged patient to call the office if there are any problems with scheduling of appointments. Return for Start twice weekly testing, HRC.   Manya Silvas, CNM

## 2015-08-07 LAB — CBC
HEMATOCRIT: 27.3 % — AB (ref 36.0–46.0)
HEMOGLOBIN: 9.2 g/dL — AB (ref 12.0–15.0)
MCH: 34.2 pg — ABNORMAL HIGH (ref 26.0–34.0)
MCHC: 33.7 g/dL (ref 30.0–36.0)
MCV: 101.5 fL — ABNORMAL HIGH (ref 78.0–100.0)
MPV: 10.9 fL (ref 8.6–12.4)
Platelets: 509 10*3/uL — ABNORMAL HIGH (ref 150–400)
RBC: 2.69 MIL/uL — AB (ref 3.87–5.11)
RDW: 21.9 % — ABNORMAL HIGH (ref 11.5–15.5)
WBC: 9 10*3/uL (ref 4.0–10.5)

## 2015-08-07 LAB — HIV ANTIBODY (ROUTINE TESTING W REFLEX): HIV 1&2 Ab, 4th Generation: NONREACTIVE

## 2015-08-07 LAB — GC/CHLAMYDIA PROBE AMP (~~LOC~~) NOT AT ARMC
Chlamydia: NEGATIVE
NEISSERIA GONORRHEA: NEGATIVE

## 2015-08-07 LAB — RPR

## 2015-08-07 LAB — GLUCOSE TOLERANCE, 1 HOUR (50G) W/O FASTING: Glucose, 1 Hour GTT: 122 mg/dL (ref 70–140)

## 2015-08-08 ENCOUNTER — Ambulatory Visit (INDEPENDENT_AMBULATORY_CARE_PROVIDER_SITE_OTHER): Payer: Self-pay | Admitting: *Deleted

## 2015-08-08 VITALS — BP 123/81 | HR 86

## 2015-08-08 DIAGNOSIS — Z36 Encounter for antenatal screening of mother: Secondary | ICD-10-CM

## 2015-08-08 DIAGNOSIS — D571 Sickle-cell disease without crisis: Secondary | ICD-10-CM

## 2015-08-08 DIAGNOSIS — O99013 Anemia complicating pregnancy, third trimester: Secondary | ICD-10-CM

## 2015-08-08 LAB — CULTURE, BETA STREP (GROUP B ONLY)

## 2015-08-08 NOTE — Progress Notes (Signed)
NST reactive.

## 2015-08-09 ENCOUNTER — Encounter (HOSPITAL_COMMUNITY): Payer: Self-pay

## 2015-08-09 ENCOUNTER — Encounter: Payer: Self-pay | Admitting: Advanced Practice Midwife

## 2015-08-09 ENCOUNTER — Inpatient Hospital Stay (HOSPITAL_COMMUNITY)
Admission: AD | Admit: 2015-08-09 | Discharge: 2015-08-09 | Disposition: A | Discharge status: Home / Self Care | Payer: Self-pay | Source: Ambulatory Visit | Attending: Family Medicine | Admitting: Family Medicine

## 2015-08-09 DIAGNOSIS — D571 Sickle-cell disease without crisis: Secondary | ICD-10-CM | POA: Insufficient documentation

## 2015-08-09 DIAGNOSIS — Z3A35 35 weeks gestation of pregnancy: Secondary | ICD-10-CM | POA: Insufficient documentation

## 2015-08-09 DIAGNOSIS — O99019 Anemia complicating pregnancy, unspecified trimester: Secondary | ICD-10-CM

## 2015-08-09 DIAGNOSIS — O99013 Anemia complicating pregnancy, third trimester: Secondary | ICD-10-CM | POA: Insufficient documentation

## 2015-08-09 DIAGNOSIS — O4703 False labor before 37 completed weeks of gestation, third trimester: Secondary | ICD-10-CM | POA: Insufficient documentation

## 2015-08-09 LAB — URINALYSIS, ROUTINE W REFLEX MICROSCOPIC
Glucose, UA: NEGATIVE mg/dL
KETONES UR: NEGATIVE mg/dL
NITRITE: NEGATIVE
PH: 6 (ref 5.0–8.0)
PROTEIN: 100 mg/dL — AB
Specific Gravity, Urine: 1.02 (ref 1.005–1.030)

## 2015-08-09 LAB — URINE MICROSCOPIC-ADD ON

## 2015-08-09 MED ORDER — NIFEDIPINE 10 MG PO CAPS
10.0000 mg | ORAL_CAPSULE | Freq: Once | ORAL | Status: AC
  Administered 2015-08-09: 10 mg via ORAL
  Filled 2015-08-09: qty 1

## 2015-08-09 NOTE — MAU Note (Signed)
Per Carmelia Roller CNM, check patient, PO hydrate and then recheck patient in 1 hour.

## 2015-08-09 NOTE — Discharge Instructions (Signed)
Reasons to return to MAU:  1.  Contractions are  5 minutes apart or less, each last 1 minute, these have been going on for 1-2 hours, and you cannot walk or talk during them 2.  You have a large gush of fluid, or a trickle of fluid that will not stop and you have to wear a pad 3.  You have bleeding that is bright red, heavier than spotting--like menstrual bleeding (spotting can be normal in early labor or after a check of your cervix) 4.  You do not feel the baby moving like he/she normally does  Preterm Labor Information Preterm labor is when labor starts at less than 37 weeks of pregnancy. The normal length of a pregnancy is 39 to 41 weeks. CAUSES Often, there is no identifiable underlying cause as to why a woman goes into preterm labor. One of the most common known causes of preterm labor is infection. Infections of the uterus, cervix, vagina, amniotic sac, bladder, kidney, or even the lungs (pneumonia) can cause labor to start. Other suspected causes of preterm labor include:   Urogenital infections, such as yeast infections and bacterial vaginosis.   Uterine abnormalities (uterine shape, uterine septum, fibroids, or bleeding from the placenta).   A cervix that has been operated on (it may fail to stay closed).   Malformations in the fetus.   Multiple gestations (twins, triplets, and so on).   Breakage of the amniotic sac.  RISK FACTORS  Having a previous history of preterm labor.   Having premature rupture of membranes (PROM).   Having a placenta that covers the opening of the cervix (placenta previa).   Having a placenta that separates from the uterus (placental abruption).   Having a cervix that is too weak to hold the fetus in the uterus (incompetent cervix).   Having too much fluid in the amniotic sac (polyhydramnios).   Taking illegal drugs or smoking while pregnant.   Not gaining enough weight while pregnant.   Being younger than 80 and older than 24  years old.   Having a low socioeconomic status.   Being African American. SYMPTOMS Signs and symptoms of preterm labor include:   Menstrual-like cramps, abdominal pain, or back pain.  Uterine contractions that are regular, as frequent as six in an hour, regardless of their intensity (may be mild or painful).  Contractions that start on the top of the uterus and spread down to the lower abdomen and back.   A sense of increased pelvic pressure.   A watery or bloody mucus discharge that comes from the vagina.  TREATMENT Depending on the length of the pregnancy and other circumstances, your health care provider may suggest bed rest. If necessary, there are medicines that can be given to stop contractions and to mature the fetal lungs. If labor happens before 34 weeks of pregnancy, a prolonged hospital stay may be recommended. Treatment depends on the condition of both you and the fetus.  WHAT SHOULD YOU DO IF YOU THINK YOU ARE IN PRETERM LABOR? Call your health care provider right away. You will need to go to the hospital to get checked immediately. HOW CAN YOU PREVENT PRETERM LABOR IN FUTURE PREGNANCIES? You should:   Stop smoking if you smoke.  Maintain healthy weight gain and avoid chemicals and drugs that are not necessary.  Be watchful for any type of infection.  Inform your health care provider if you have a known history of preterm labor.   This information is not  intended to replace advice given to you by your health care provider. Make sure you discuss any questions you have with your health care provider.   Document Released: 10/23/2003 Document Revised: 04/04/2013 Document Reviewed: 09/04/2012 Elsevier Interactive Patient Education [employer].

## 2015-08-09 NOTE — MAU Note (Signed)
Pt c/o cramping and tightness in abdomen since 0200. States comes and goes every 5-10 mins. Denies LOF or vag bleeding. +FM

## 2015-08-09 NOTE — MAU Provider Note (Signed)
Chief Complaint:  Contractions   None     HPI: Gabriella Griffin is a 24 y.o. G1P0 at [redacted]w[redacted]d who presents to maternity admissions reporting onset of cramping/contractions at 3 am this morning. She woke up because she reports her stomach felt tight and that was unusual.  When she timed the cramps they were 5 minutes apart so she came to the hospital.  She reports the every 5 minute cramps were not painful but she did have 2 episodes of stronger pain in her low back, radiating around to her stomach and these were more painful.  She has not tried medications or any comfort measures like drinking more water or position change but reports that resting has not helped.  She is seen in [facility] for sickle cell anemia but denies any symptoms of sickle cell crisis this morning.  She reports good fetal movement, denies LOF, vaginal bleeding, vaginal itching/burning, urinary symptoms, h/a, dizziness, n/v, or fever/chills.    HPI  Past Medical History: Past Medical History  Diagnosis Date  . Sickle cell anemia (HCC)   . Sickle cell anemia (HCC) 1996    Past obstetric history: OB History  Gravida Para Term Preterm AB SAB TAB Ectopic Multiple Living  1             # Outcome Date GA Lbr Len/2nd Weight Sex Delivery Anes PTL Lv  1 Current               Past Surgical History: Past Surgical History  Procedure Laterality Date  . Cholecystectomy  2007    Family History: Family History  Problem Relation Age of Onset  . Sickle cell anemia Sister   . Diabetes Paternal Grandmother     Social History: Social History  Substance Use Topics  . Smoking status: Never Smoker   . Smokeless tobacco: None  . Alcohol Use: No    Allergies:  Allergies  Allergen Reactions  . Ceftin [Cefuroxime Axetil] Rash    Meds:  Prescriptions prior to admission  Medication Sig Dispense Refill Last Dose  . diphenhydrAMINE (BENADRYL) 25 mg capsule Take 25 mg by mouth every 6 (six) hours as needed.   08/08/2015 at Unknown  time  . folic acid (FOLVITE) 1 MG tablet Take 2 tablets (2 mg total) by mouth daily. 60 tablet 3 08/08/2015 at Unknown time  . hydrocortisone cream 1 % Apply 1 application topically 2 (two) times daily.   prn  . Prenatal Vit-Fe Fumarate-FA (MULTIVITAMIN-PRENATAL) 27-0.8 MG TABS tablet Take 1 tablet by mouth every morning.    08/08/2015 at Unknown time  . acetaminophen (TYLENOL) 500 MG tablet Take 1,000 mg by mouth every 6 (six) hours as needed for mild pain.   prn  . azithromycin (ZITHROMAX) 250 MG tablet Take 1 tablet (250 mg total) by mouth daily. (Patient not taking: Reported on 08/09/2015) 2 each 0 Not Taking at Unknown time  . oxyCODONE (OXY IR/ROXICODONE) 5 MG immediate release tablet Take 1 -2 tabs (one tab if pain <7/10 and 2 tabs if pain >7/10) Q6 Hours PRN pain. (Patient not taking: Reported on 08/09/2015) 60 tablet 0 Not Taking at Unknown time    ROS:  Review of Systems  Constitutional: Negative for fever, chills and fatigue.  Respiratory: Negative for shortness of breath.   Cardiovascular: Negative for chest pain.  Gastrointestinal: Positive for abdominal pain.  Genitourinary: Positive for pelvic pain. Negative for dysuria, flank pain, vaginal bleeding, vaginal discharge, difficulty urinating and vaginal pain.  Neurological: Negative for  dizziness and headaches.  Psychiatric/Behavioral: Negative.      I have reviewed patient's Past Medical Hx, Surgical Hx, Family Hx, Social Hx, medications and allergies.   Physical Exam   Patient Vitals for the past 24 hrs:  BP Temp Pulse Resp SpO2 Height Weight  08/09/15 0752 127/81 mmHg - 81 - - - -  08/09/15 0553 133/80 mmHg 98.3 F (36.8 C) 82 18 94 % - -  08/09/15 0542 - - - - - 5\' 3"  (1.6 m) 133 lb 3.2 oz (60.419 kg)   Constitutional: Well-developed, well-nourished female in no acute distress.  Cardiovascular: normal rate Respiratory: normal effort GI: Abd soft, non-tender, gravid appropriate for gestational age.  MS: Extremities  nontender, no edema, normal ROM Neurologic: Alert and oriented x 4.  GU: Neg CVAT.  PELVIC EXAM: Cervix pink, visually closed, without lesion, scant white creamy discharge, vaginal walls and external genitalia normal Bimanual exam: Cervix 0/long/high, firm, anterior, neg CMT, uterus nontender, nonenlarged, adnexa without tenderness, enlargement, or mass  Dilation: Closed Effacement (%): Thick Station: -3 Exam by:: D Simpson RN   Cervix rechecked in 1 hour: Dilation: Closed Effacement (%): Thick Station: -3 Exam by:: D Simpson RN  FHT:  Baseline 135 , moderate variability, accelerations present, no decelerations Contractions: q 2-6 mins, irregular   Labs: Results for orders placed or performed during the hospital encounter of 08/09/15 (from the past 24 hour(s))  Urinalysis, Routine w reflex microscopic (not at [facility])     Status: Abnormal   Collection Time: 08/09/15  5:38 AM  Result Value Ref Range   Color, Urine AMBER (A) YELLOW   APPearance CLEAR CLEAR   Specific Gravity, Urine 1.020 1.005 - 1.030   pH 6.0 5.0 - 8.0   Glucose, UA NEGATIVE NEGATIVE mg/dL   Hgb urine dipstick MODERATE (A) NEGATIVE   Bilirubin Urine SMALL (A) NEGATIVE   Ketones, ur NEGATIVE NEGATIVE mg/dL   Protein, ur 100 (A) NEGATIVE mg/dL   Nitrite NEGATIVE NEGATIVE   Leukocytes, UA TRACE (A) NEGATIVE  Urine microscopic-add on     Status: Abnormal   Collection Time: 08/09/15  5:38 AM  Result Value Ref Range   Squamous Epithelial / LPF 0-5 (A) NONE SEEN   WBC, UA 0-5 0 - 5 WBC/hpf   RBC / HPF 0-5 0 - 5 RBC/hpf   Bacteria, UA RARE (A) NONE SEEN   O/POS/-- (06/21 1631)   MAU Course/MDM: I have ordered labs and reviewed results.  Reviewed FHR tracing.  Cervix closed over 1 hour in MAU. Pt contracting on toco but no evidence of preterm labor.  Treatments in MAU included LR x 1000 ml.  Labor precautions given, pt encouraged to increase PO fluids.  Pt stable at time of discharge.  Assessment: 1. Threatened  preterm labor, third trimester   2. Sickle cell anemia of mother during pregnancy [facility])     Plan: Discharge home Labor precautions and fetal kick counts      Follow-up Information    Follow up with [facility].   Specialty:  Obstetrics and Gynecology   Why:  As scheduled, Return to MAU as needed for emergencies/signs of labor   Contact information:   Mayodan Bowman [phone]       Medication List    STOP taking these medications        azithromycin 250 MG tablet  Commonly known as:  ZITHROMAX     oxyCODONE 5 MG immediate release tablet  Commonly known as:  Oxy IR/ROXICODONE      TAKE these medications        acetaminophen 500 MG tablet  Commonly known as:  TYLENOL  Take 1,000 mg by mouth every 6 (six) hours as needed for mild pain.     diphenhydrAMINE 25 mg capsule  Commonly known as:  BENADRYL  Take 25 mg by mouth every 6 (six) hours as needed.     folic acid 1 MG tablet  Commonly known as:  FOLVITE  Take 2 tablets (2 mg total) by mouth daily.     hydrocortisone cream 1 %  Apply 1 application topically 2 (two) times daily.     multivitamin-prenatal 27-0.8 MG Tabs tablet  Take 1 tablet by mouth every morning.        Fatima Blank Certified Nurse-Midwife 08/09/2015 7:56 AM

## 2015-08-12 ENCOUNTER — Other Ambulatory Visit: Payer: Self-pay

## 2015-08-12 ENCOUNTER — Encounter: Payer: Self-pay | Admitting: *Deleted

## 2015-08-14 ENCOUNTER — Inpatient Hospital Stay (HOSPITAL_COMMUNITY)
Admission: AD | Admit: 2015-08-14 | Discharge: 2015-08-29 | DRG: 765 | Disposition: A | Discharge status: Home / Self Care | Payer: Medicaid Other | Source: Other Acute Inpatient Hospital | Attending: Family Medicine | Admitting: Family Medicine

## 2015-08-14 ENCOUNTER — Encounter (HOSPITAL_COMMUNITY): Payer: Self-pay | Admitting: Internal Medicine

## 2015-08-14 ENCOUNTER — Other Ambulatory Visit: Payer: Self-pay

## 2015-08-14 DIAGNOSIS — E876 Hypokalemia: Secondary | ICD-10-CM | POA: Diagnosis not present

## 2015-08-14 DIAGNOSIS — O99013 Anemia complicating pregnancy, third trimester: Secondary | ICD-10-CM

## 2015-08-14 DIAGNOSIS — O99284 Endocrine, nutritional and metabolic diseases complicating childbirth: Secondary | ICD-10-CM | POA: Diagnosis present

## 2015-08-14 DIAGNOSIS — D5700 Hb-SS disease with crisis, unspecified: Secondary | ICD-10-CM | POA: Diagnosis present

## 2015-08-14 DIAGNOSIS — Z3403 Encounter for supervision of normal first pregnancy, third trimester: Secondary | ICD-10-CM

## 2015-08-14 DIAGNOSIS — O99019 Anemia complicating pregnancy, unspecified trimester: Secondary | ICD-10-CM

## 2015-08-14 DIAGNOSIS — E860 Dehydration: Secondary | ICD-10-CM | POA: Diagnosis present

## 2015-08-14 DIAGNOSIS — O1413 Severe pre-eclampsia, third trimester: Secondary | ICD-10-CM

## 2015-08-14 DIAGNOSIS — D564 Hereditary persistence of fetal hemoglobin [HPFH]: Secondary | ICD-10-CM | POA: Diagnosis present

## 2015-08-14 DIAGNOSIS — D571 Sickle-cell disease without crisis: Secondary | ICD-10-CM

## 2015-08-14 DIAGNOSIS — O1414 Severe pre-eclampsia complicating childbirth: Secondary | ICD-10-CM | POA: Diagnosis present

## 2015-08-14 DIAGNOSIS — O352XX0 Maternal care for (suspected) hereditary disease in fetus, not applicable or unspecified: Secondary | ICD-10-CM

## 2015-08-14 DIAGNOSIS — O9902 Anemia complicating childbirth: Secondary | ICD-10-CM | POA: Diagnosis present

## 2015-08-14 DIAGNOSIS — D72829 Elevated white blood cell count, unspecified: Secondary | ICD-10-CM

## 2015-08-14 DIAGNOSIS — R1900 Intra-abdominal and pelvic swelling, mass and lump, unspecified site: Secondary | ICD-10-CM

## 2015-08-14 DIAGNOSIS — Z3A37 37 weeks gestation of pregnancy: Secondary | ICD-10-CM | POA: Diagnosis not present

## 2015-08-14 DIAGNOSIS — O0993 Supervision of high risk pregnancy, unspecified, third trimester: Secondary | ICD-10-CM

## 2015-08-14 DIAGNOSIS — N390 Urinary tract infection, site not specified: Secondary | ICD-10-CM | POA: Diagnosis present

## 2015-08-14 LAB — CREATININE, SERUM: Creatinine, Ser: <0.3 mg/dL — ABNORMAL LOW (ref 0.44–1.00)

## 2015-08-14 LAB — CBC
HEMATOCRIT: 21.9 % — AB (ref 36.0–46.0)
Hemoglobin: 7.7 g/dL — ABNORMAL LOW (ref 12.0–15.0)
MCH: 34.8 pg — AB (ref 26.0–34.0)
MCHC: 35.2 g/dL (ref 30.0–36.0)
MCV: 99.1 fL (ref 78.0–100.0)
PLATELETS: 364 10*3/uL (ref 150–400)
RBC: 2.21 MIL/uL — ABNORMAL LOW (ref 3.87–5.11)
RDW: 24.6 % — AB (ref 11.5–15.5)
WBC: 11.3 10*3/uL — AB (ref 4.0–10.5)

## 2015-08-14 LAB — LACTATE DEHYDROGENASE: LDH: 410 U/L — ABNORMAL HIGH (ref 98–192)

## 2015-08-14 MED ORDER — POLYETHYLENE GLYCOL 3350 17 G PO PACK
17.0000 g | PACK | Freq: Every day | ORAL | Status: DC | PRN
  Administered 2015-08-18 – 2015-08-19 (×2): 17 g via ORAL
  Filled 2015-08-14 (×2): qty 1

## 2015-08-14 MED ORDER — NALOXONE HCL 0.4 MG/ML IJ SOLN: 0.4000 mg | INTRAMUSCULAR | Status: DC | PRN

## 2015-08-14 MED ORDER — PRENATAL 27-0.8 MG PO TABS
1.0000 | ORAL_TABLET | Freq: Every morning | ORAL | Status: DC
  Administered 2015-08-14 – 2015-08-21 (×8): 1 via ORAL
  Filled 2015-08-14 (×11): qty 1

## 2015-08-14 MED ORDER — ENOXAPARIN SODIUM 40 MG/0.4ML ~~LOC~~ SOLN
40.0000 mg | SUBCUTANEOUS | Status: DC
  Administered 2015-08-16 – 2015-08-20 (×5): 40 mg via SUBCUTANEOUS
  Filled 2015-08-14 (×8): qty 0.4

## 2015-08-14 MED ORDER — HYDROMORPHONE HCL 2 MG/ML IJ SOLN: 2.0000 mg | Freq: Once | INTRAMUSCULAR | Status: DC

## 2015-08-14 MED ORDER — FOLIC ACID 1 MG PO TABS
2.0000 mg | ORAL_TABLET | Freq: Every day | ORAL | Status: DC
  Administered 2015-08-14 – 2015-08-29 (×14): 2 mg via ORAL
  Filled 2015-08-14 (×17): qty 2

## 2015-08-14 MED ORDER — SENNOSIDES-DOCUSATE SODIUM 8.6-50 MG PO TABS
1.0000 | ORAL_TABLET | Freq: Two times a day (BID) | ORAL | Status: DC
  Administered 2015-08-14 – 2015-08-21 (×15): 1 via ORAL
  Filled 2015-08-14 (×23): qty 1

## 2015-08-14 MED ORDER — DEXTROSE-NACL 5-0.45 % IV SOLN
INTRAVENOUS | Status: DC
  Administered 2015-08-14 – 2015-08-21 (×9): via INTRAVENOUS

## 2015-08-14 MED ORDER — HYDROMORPHONE HCL 1 MG/ML IJ SOLN
1.0000 mg | Freq: Once | INTRAMUSCULAR | Status: AC
Start: 2015-08-14 — End: 2015-08-14
  Administered 2015-08-14: 1 mg via INTRAVENOUS
  Filled 2015-08-14: qty 1

## 2015-08-14 MED ORDER — ONDANSETRON HCL 4 MG/2ML IJ SOLN
4.0000 mg | Freq: Four times a day (QID) | INTRAMUSCULAR | Status: DC | PRN
  Administered 2015-08-15 – 2015-08-21 (×6): 4 mg via INTRAVENOUS
  Filled 2015-08-14 (×6): qty 2

## 2015-08-14 MED ORDER — HYDROMORPHONE 1 MG/ML IV SOLN
INTRAVENOUS | Status: DC
  Administered 2015-08-14: 15:00:00 via INTRAVENOUS
  Administered 2015-08-14: 3 mg via INTRAVENOUS
  Administered 2015-08-15: 3.5 mg via INTRAVENOUS
  Administered 2015-08-15: 18:00:00 via INTRAVENOUS
  Administered 2015-08-15: 3 mg via INTRAVENOUS
  Administered 2015-08-15: 1.5 mg via INTRAVENOUS
  Administered 2015-08-16: 4 mg via INTRAVENOUS
  Administered 2015-08-16: 3.5 mg via INTRAVENOUS
  Administered 2015-08-16: 1.5 mg via INTRAVENOUS
  Administered 2015-08-16: 1 mg via INTRAVENOUS
  Administered 2015-08-17 (×2): 3 mg via INTRAVENOUS
  Administered 2015-08-17: 3.5 mg via INTRAVENOUS
  Administered 2015-08-17: 1.5 mg via INTRAVENOUS
  Administered 2015-08-17: 4 mg via INTRAVENOUS
  Administered 2015-08-17: 2.5 mg via INTRAVENOUS
  Administered 2015-08-18: 3 mg via INTRAVENOUS
  Administered 2015-08-18: 6 mg via INTRAVENOUS
  Administered 2015-08-18: 1 mg via INTRAVENOUS
  Administered 2015-08-18: 08:00:00 via INTRAVENOUS
  Administered 2015-08-18: 6 mg via INTRAVENOUS
  Administered 2015-08-18: 1 mg via INTRAVENOUS
  Administered 2015-08-18: 1.5 mg via INTRAVENOUS
  Administered 2015-08-18: 3 mg via INTRAVENOUS
  Administered 2015-08-19: 5 mg via INTRAVENOUS
  Administered 2015-08-19: 4.5 mg via INTRAVENOUS
  Administered 2015-08-19: 06:00:00 via INTRAVENOUS
  Administered 2015-08-19: 4.5 mg via INTRAVENOUS
  Administered 2015-08-19: 2.5 mg via INTRAVENOUS
  Administered 2015-08-19: 2 mg via INTRAVENOUS
  Administered 2015-08-20: 3 mg via INTRAVENOUS
  Administered 2015-08-20: 10:00:00 via INTRAVENOUS
  Administered 2015-08-20: 3 mg via INTRAVENOUS
  Administered 2015-08-20 – 2015-08-21 (×2): 3.5 mg via INTRAVENOUS
  Administered 2015-08-21: 4 mg via INTRAVENOUS
  Administered 2015-08-21: 3.47 mg via INTRAVENOUS
  Administered 2015-08-21: 2 mg via INTRAVENOUS
  Administered 2015-08-21: 4 mg via INTRAVENOUS
  Filled 2015-08-14 (×6): qty 25

## 2015-08-14 MED ORDER — SODIUM CHLORIDE 0.9 % IJ SOLN: 9.0000 mL | INTRAMUSCULAR | Status: DC | PRN

## 2015-08-14 MED ORDER — POTASSIUM CHLORIDE CRYS ER 20 MEQ PO TBCR
40.0000 meq | EXTENDED_RELEASE_TABLET | Freq: Two times a day (BID) | ORAL | Status: DC
  Administered 2015-08-14 – 2015-08-21 (×15): 40 meq via ORAL
  Filled 2015-08-14 (×17): qty 2

## 2015-08-14 NOTE — Progress Notes (Signed)
RROB spoke with Dr Roselie Awkward and told of fhr and contractions and cervix is closed and thick. Dr is aware and no further assessment is needed at this time.

## 2015-08-14 NOTE — H&P (Signed)
Hospital Admission Note Date: 08/14/2015  Patient name: Gabriella Griffin record number: [id number] Date of birth: 06-Oct-1990 Age: 24 y.o. Gender: female PCP: No primary care provider on file.  Attending physician: [name], MD  Chief Complaint:Pain "all over " for several days.  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 Pt is also prenant 36 GA and her OB-Gyn is within our system at [facility]. Thus transfer to our system was requested by the ED as per Dr. Lovena Le the Hospitalist and Ob-Gyn did not feel it was not in the best interest of the patient and fetus to remain at [facility] fro treatment of her crisis.   Pt reports pain currently at 8/10 and "all over". She reports that she has had transient decrease in intensity of pain lasting about 20 minutes after each dose of IV Dilaudid. She denies F/C/V/D, weakness, vaginal leaking, dysuria or any other symptoms.   A review of the records from [facility] show WBC 12, Hb 8.9. HCT25.5, platelets 334, PMN's 67.85, Na 141, K 3.3, BUN 6, Cr 0.38, GFR >90.  Fetal Heart Tones 129. Pt received 2L 0.9NS and 2 doses of Dilaudid 1 mg IVP.  Scheduled Meds: . enoxaparin (LOVENOX) injection  40 mg Subcutaneous Q24H  . folic acid  2 mg Oral Daily  . HYDROmorphone   Intravenous 6 times per day  .  HYDROmorphone (DILAUDID) injection  1 mg Intravenous Once  . multivitamin-prenatal  1 tablet Oral q morning - 10a  . senna-docusate  1 tablet Oral BID   Continuous Infusions: . dextrose 5 % and 0.45% NaCl     PRN Meds:.naloxone **AND** sodium chloride, ondansetron (ZOFRAN) IV, polyethylene glycol Allergies: Ceftin Past Medical History  Diagnosis Date  . Sickle cell anemia (HCC)   . Sickle cell anemia ([place]) 1996   Past Surgical History  Procedure Laterality Date  . Cholecystectomy  2007   Family History  Problem Relation Age of Onset  . Sickle cell anemia  Sister   . Diabetes Paternal Grandmother   . Sickle cell trait Mother   . Sickle cell trait Father    Social History   Social History  . Marital Status: Single    Spouse Name: N/A  . Number of Children: N/A  . Years of Education: N/A   Occupational History  . Not on file.   Social History Main Topics  . Smoking status: Never Smoker   . Smokeless tobacco: Not on file  . Alcohol Use: No  . Drug Use: No  . Sexual Activity: Yes    Birth Control/ Protection: None   Other Topics Concern  . Not on file   Social History Narrative   Review of Systems: A comprehensive review of systems was negative except as noted in HPI. Physical Exam: No intake or output data in the 24 hours ending 08/14/15 1408 General: Alert, awake, oriented x3, in moderate distress due to pain.  HEENT: [place]/AT PEERL, EOMI, amicteric Neck: Trachea midline,  no masses, no thyromegal,y no JVD, no carotid bruit OROPHARYNX:  Moist, No exudate/ erythema/lesions.  Heart: Regular rate and rhythm, without murmurs, rubs, gallops, PMI non-displaced, no heaves or thrills on palpation.  Lungs: Clear to auscultation, no wheezing or rhonchi noted. No increased vocal fremitus resonant to percussion  Abdomen: Gravid Neuro: No focal neurological deficits noted cranial nerves II through XII grossly intact.  Strength at functional baseleine in bilateral upper  and lower extremities. Musculoskeletal: No warm swelling or erythema around joints, no spinal tenderness noted. Psychiatric: Patient alert and oriented x3, good insight and cognition, good recent to remote recall.   Lab results: No results for input(s): NA, K, CL, CO2, GLUCOSE, BUN, CREATININE, CALCIUM, MG, PHOS in the last 72 hours. No results for input(s): AST, ALT, ALKPHOS, BILITOT, PROT, ALBUMIN in the last 72 hours. No results for input(s): LIPASE, AMYLASE in the last 72 hours. No results for input(s): WBC, NEUTROABS, HGB, HCT, MCV, PLT in the last 72 hours. No  results for input(s): CKTOTAL, CKMB, CKMBINDEX, TROPONINI in the last 72 hours. Invalid input(s): POCBNP No results for input(s): DDIMER in the last 72 hours. No results for input(s): HGBA1C in the last 72 hours. No results for input(s): CHOL, HDL, LDLCALC, TRIG, CHOLHDL, LDLDIRECT in the last 72 hours. No results for input(s): TSH, T4TOTAL, T3FREE, THYROIDAB in the last 72 hours.  Invalid input(s): FREET3 No results for input(s): VITAMINB12, FOLATE, FERRITIN, TIBC, IRON, RETICCTPCT in the last 72 hours. Imaging results:  [place] Mfm Ob Follow Up  08/01/2015  OBSTETRICAL ULTRASOUND: This exam was performed within a [facility]. The OB US report was generated in the AS system, and faxed to the ordering physician.  This report is available in the [facility]. See the AS Obstetric US report via the Image Link.   Assessment and Plan: 1. Hb SS with HPFH with Crisis: Will start Dilaudid PCA weight based. Also continue IVF D5.45 @75  ml/hr. Pt unable to receive NSAID's due to pregnancy 2. Leukocytosis: Likely related to crisis. No evidence of infection. Will continue monitor. 3. Hypokalemia: Will replace orally. 4. 3rd Trimester IUP: Ob-Gyn consulted. Rapid response team o see patient per protocol.  Time Spent 60 minutes.  MATTHEWS,MICHELLE A. 08/14/2015, 2:08 PM

## 2015-08-14 NOTE — Progress Notes (Signed)
Pt reports some pain and burning in pelvis and some cramping; no vaginal bleeding or leaking of fluid; positive fetal movement.

## 2015-08-15 ENCOUNTER — Encounter (HOSPITAL_COMMUNITY): Payer: Self-pay | Admitting: *Deleted

## 2015-08-15 LAB — URINE MICROSCOPIC-ADD ON: RBC / HPF: NONE SEEN RBC/hpf (ref 0–5)

## 2015-08-15 LAB — CBC WITH DIFFERENTIAL/PLATELET
BASOS ABS: 0 10*3/uL (ref 0.0–0.1)
Basophils Relative: 0 %
EOS ABS: 0.2 10*3/uL (ref 0.0–0.7)
EOS PCT: 2 %
HEMATOCRIT: 23.3 % — AB (ref 36.0–46.0)
Hemoglobin: 8.4 g/dL — ABNORMAL LOW (ref 12.0–15.0)
LYMPHS ABS: 2.4 10*3/uL (ref 0.7–4.0)
Lymphocytes Relative: 20 %
MCH: 35.6 pg — ABNORMAL HIGH (ref 26.0–34.0)
MCHC: 36.1 g/dL — AB (ref 30.0–36.0)
MCV: 98.7 fL (ref 78.0–100.0)
MONOS PCT: 10 %
Monocytes Absolute: 1.2 10*3/uL — ABNORMAL HIGH (ref 0.1–1.0)
NEUTROS PCT: 68 %
Neutro Abs: 8.4 10*3/uL — ABNORMAL HIGH (ref 1.7–7.7)
Platelets: 359 10*3/uL (ref 150–400)
RBC: 2.36 MIL/uL — AB (ref 3.87–5.11)
RDW: 25.5 % — AB (ref 11.5–15.5)
WBC: 12.2 10*3/uL — AB (ref 4.0–10.5)
nRBC: 28 /100 WBC — ABNORMAL HIGH

## 2015-08-15 LAB — URINALYSIS, ROUTINE W REFLEX MICROSCOPIC
Glucose, UA: NEGATIVE mg/dL
KETONES UR: NEGATIVE mg/dL
NITRITE: POSITIVE — AB
PH: 6 (ref 5.0–8.0)
Specific Gravity, Urine: 1.013 (ref 1.005–1.030)

## 2015-08-15 LAB — BASIC METABOLIC PANEL
ANION GAP: 9 (ref 5–15)
BUN: <5 mg/dL — ABNORMAL LOW (ref 6–20)
CALCIUM: 8.5 mg/dL — AB (ref 8.9–10.3)
CO2: 23 mmol/L (ref 22–32)
CREATININE: 0.31 mg/dL — AB (ref 0.44–1.00)
Chloride: 105 mmol/L (ref 101–111)
Glucose, Bld: 76 mg/dL (ref 65–99)
Potassium: 4.2 mmol/L (ref 3.5–5.1)
SODIUM: 137 mmol/L (ref 135–145)

## 2015-08-15 MED ORDER — NITROFURANTOIN MONOHYD MACRO 100 MG PO CAPS: 100.0000 mg | ORAL_CAPSULE | Freq: Two times a day (BID) | ORAL | Status: DC

## 2015-08-15 MED ORDER — NITROFURANTOIN MONOHYD MACRO 100 MG PO CAPS
100.0000 mg | ORAL_CAPSULE | Freq: Two times a day (BID) | ORAL | Status: DC
  Administered 2015-08-15 – 2015-08-21 (×12): 100 mg via ORAL
  Filled 2015-08-15 (×14): qty 1

## 2015-08-15 MED ORDER — LACTATED RINGERS IV BOLUS (SEPSIS)
1000.0000 mL | Freq: Once | INTRAVENOUS | Status: AC
  Administered 2015-08-15: 1000 mL via INTRAVENOUS

## 2015-08-15 NOTE — Progress Notes (Signed)
Pt stating that she is having pressure in her lower abdomen with sharp burning pains. She states she has contractions that come and go. Rapid Response at [facility] was notified of the situation. MD states she will come assess pt. Pt comfortable at this time. Fraser Busche W Ashante Snelling, RN

## 2015-08-15 NOTE — Progress Notes (Signed)
Reports no pain from contractions at this time.

## 2015-08-15 NOTE — Progress Notes (Signed)
1200 Received call from [facility] reporting that Gabriella Griffin was c/o abdominal pain and thinks she may be contracting.  RROB RN in the building seeing ED patient.  1220 Arrived to evaluate patient.  Patient noted to be contracting frequently.  Patient up to bathroom to empty bladder.  Continued contractions noted. SVE closed/70/-3 today, closed/50/-3 yesterday.  8  Dr. Gala Romney notified of contractions and SVE and interventions.  Received orders for 1 L LR bolus and to have patient void frequently and for RROB RN to recheck SVE in 1 hour.  68  Dr. Gala Romney notified of patient with UTI symptoms.  Orders for UA received.  52  Dr. Gala Romney notified of SVE unchanged, continued contractions but patient more comfortable now.  Orders for Macrobid 100 mg BID received and OK to remove EFM.  Floor RNs to call OBRR RN if patient complains of contractions or other pregnancy related concerns.  Otherwise OBRR RN will perform daily NST tomorrow as per previous orders.

## 2015-08-15 NOTE — Progress Notes (Signed)
SICKLE CELL SERVICE PROGRESS NOTE  Gabriella Griffin [id number] DOB: 04/05/1991 DOA: 08/14/2015 PCP: No primary care provider on file.  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 Hb SS with HPFH with crisis: Pt on Dilaudid PCA and pain improving. Will continue current therapy. Review for weaning and transition to oral analgesics. 2. Leukocytosis: Mild leukocytosis. Likely related to crisis. 3. Anemia of chronic Disease: at baseline. No indication for transfusion.  4. 3rd Trimester IUP: Pt at [redacted] weeks GA and was seen by Ob-Gyn [facility] response Nurses. Per RR, cervix closed and thick.    Code Status: Full Code Family Communication: N/A Disposition Plan: Not yet ready for discharge  [place].  Pager [phone]. If 7PM-7AM, please contact night-coverage.  08/15/2015, 3:19 PM  LOS: 1 day   Interim History: Pt reports that she has less pain today and the pain that she does have is localized mostly to her right shoulder.  Consultants:  Ob-Gyn  Procedures:  None  Antibiotics:  None   Objective: Filed Vitals:   08/15/15 0820 08/15/15 1051 08/15/15 1245 08/15/15 1448  BP:  135/84    Pulse:  78    Temp:  98.1 F (36.7 C)    TempSrc:  Oral    Resp: 16 20 18 18   SpO2: 95% 96% 100% 100%   Weight change:   Intake/Output Summary (Last 24 hours) at 08/15/15 1519 Last data filed at 08/14/15 1920  Gross per 24 hour  Intake      0 ml  Output      0 ml  Net      0 ml    General: Alert, awake, oriented x3, in no acute distress.  HEENT: [place]/AT PEERL, EOMI, anicteric Neck: Trachea midline,  no masses, no thyromegal,y no JVD, no carotid bruit OROPHARYNX:  Moist, No exudate/ erythema/lesions.  Heart: Regular rate and rhythm, without murmurs, rubs, gallops, PMI non-displaced, no heaves or thrills on palpation.  Lungs: Clear to auscultation, no  wheezing or rhonchi noted. No increased vocal fremitus resonant to percussion  Abdomen: Soft, nontender, nondistended, positive bowel sounds, no masses no hepatosplenomegaly noted..  Neuro: No focal neurological deficits noted cranial nerves II through XII grossly intact.  Strength at functional baseline in bilateral upper and lower extremities. Musculoskeletal: No warm swelling or erythema around joints, no spinal tenderness noted. Psychiatric: Patient alert and oriented x3, good insight and cognition, good recent to remote recall.    Data Reviewed: Basic Metabolic Panel:  Recent Labs Lab 08/14/15 1415 08/15/15 0535  NA  --  137  K  --  4.2  CL  --  105  CO2  --  23  GLUCOSE  --  76  BUN  --  <5*  CREATININE <0.30* 0.31*  CALCIUM  --  8.5*   Liver Function Tests: No results for input(s): AST, ALT, ALKPHOS, BILITOT, PROT, ALBUMIN in the last 168 hours. No results for input(s): LIPASE, AMYLASE in the last 168 hours. No results for input(s): AMMONIA in the last 168 hours. CBC:  Recent Labs Lab 08/14/15 1415 08/15/15 0535  WBC 11.3* 12.2*  NEUTROABS  --  8.4*  HGB 7.7* 8.4*  HCT 21.9* 23.3*  MCV 99.1 98.7  PLT 364 359   Cardiac Enzymes: No results for input(s): CKTOTAL, CKMB, CKMBINDEX, TROPONINI in the last 168 hours. BNP (last 3 results) No results for input(s): BNP in the  last 8760 hours.  ProBNP (last 3 results) No results for input(s): PROBNP in the last 8760 hours.  CBG: No results for input(s): GLUCAP in the last 168 hours.  Recent Results (from the past 240 hour(s))  Culture, beta strep (group b only)     Status: None   Collection Time: 08/06/15 12:09 PM  Result Value Ref Range Status   Organism ID, Bacteria NO GROUP B STREP (S.AGALACTIAE) ISOLATED  Final     Studies: [place] Mfm Ob Follow Up  08/01/2015  OBSTETRICAL ULTRASOUND: This exam was performed within a [facility]trasound Department. The OB US report was generated in the AS system, and faxed  to the ordering physician.  This report is available in the [facility]. See the AS Obstetric US report via the Image Link.   Scheduled Meds: . enoxaparin (LOVENOX) injection  40 mg Subcutaneous Q24H  . folic acid  2 mg Oral Daily  . HYDROmorphone   Intravenous 6 times per day  . multivitamin-prenatal  1 tablet Oral q morning - 10a  . nitrofurantoin (macrocrystal-monohydrate)  100 mg Oral Q12H  . potassium chloride  40 mEq Oral BID  . senna-docusate  1 tablet Oral BID   Continuous Infusions: . 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

## 2015-08-15 NOTE — Care Management Note (Signed)
Case Management Note  Patient Details  Name: Gabriella Griffin MRN: [id number] Date of Birth: 1990-12-27  Subjective/Objective:24 y/o f admitted w/SSC. From home.                    Action/Plan:d/c plan home.   Expected Discharge Date:   (unknown)               Expected Discharge Plan:     In-House Referral:     Discharge planning Services     Post Acute Care Choice:    Choice offered to:     DME Arranged:    DME Agency:     HH Arranged:    [place] Agency:     Status of Service:     Medicare Important Message Given:    Date Medicare IM Given:    Medicare IM give by:    Date Additional Medicare IM Given:    Additional Medicare Important Message give by:     If discussed at [place] of Stay Meetings, dates discussed:    Additional Comments:  Dessa Phi, RN 08/15/2015, 2:37 PM

## 2015-08-16 DIAGNOSIS — D571 Sickle-cell disease without crisis: Secondary | ICD-10-CM

## 2015-08-16 DIAGNOSIS — D564 Hereditary persistence of fetal hemoglobin [HPFH]: Secondary | ICD-10-CM

## 2015-08-16 DIAGNOSIS — O352XX0 Maternal care for (suspected) hereditary disease in fetus, not applicable or unspecified: Secondary | ICD-10-CM

## 2015-08-16 NOTE — Progress Notes (Signed)
Progress Note   [name] [id number] DOB: 04/08/1991 DOA: 08/14/2015 PCP: No primary care provider on file.   Brief Narrative:   Gabriella Griffin is an 24 y.o. female at [redacted] weeks gestation who was admitted 08/14/15 with sickle cell crisis.  Assessment/Plan:   Principal Problem:   Hb-SS disease with crisis ([place]) - Pain currently reasonably well-controlled on PCA Dilaudid. - Continue folic acid. - Continue MiraLAX and Senokot to prevent opioid-induced constipation. - Hemoglobin stable at 8.4.  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 No decelerations noted.    UTI - Macrobid ordered by OB.    DVT Prophylaxis - Lovenox ordered.   Family Communication/Anticipated D/C date and plan/Code Status   Family Communication: No family currently at the bedside. Disposition Plan: Home when pain controlled and patient functional. Anticipated D/C date:   2 days. Code Status: Full code.   IV Access:    Peripheral IV   Procedures and diagnostic studies:   No results found.   Medical Consultants:    OB-GYN  Anti-Infectives:   Macrobid 08/16/15--->  Subjective:    [name] since her pain is better controlled. Still has some nausea but no vomiting. Had some uterine contractions in the night. No bloody show.  Objective:    Filed Vitals:   08/16/15 0800 08/16/15 1000 08/16/15 1200 08/16/15 1431  BP:  144/59  137/83  Pulse:  98  96  Temp:  98.7 F (37.1 C)  98.6 F (37 C)  TempSrc:  Oral  Oral  Resp: 17 15 16 18   SpO2: 96% 100% 96% 98%    Intake/Output Summary (Last 24 hours) at  08/16/15 1515 Last data filed at 08/16/15 1300  Gross per 24 hour  Intake    480 ml  Output      0 ml  Net    480 ml   There were no vitals filed for this visit.  Exam: Gen:  NAD Cardiovascular:  RRR, No M/R/G Respiratory:  Lungs CTAB Gastrointestinal:  Abdomen soft, NT/ND, + BS Extremities:  No C/E/C   Data Reviewed:    Labs: Basic Metabolic Panel:  Recent Labs Lab 08/14/15 1415 08/15/15 0535  NA  --  137  K  --  4.2  CL  --  105  CO2  --  23  GLUCOSE  --  76  BUN  --  <5*  CREATININE <0.30* 0.31*  CALCIUM  --  8.5*   GFR Estimated Creatinine Clearance: 89.7 mL/min (by C-G formula based on Cr of 0.31). Liver Function Tests: No results for input(s): AST, ALT, ALKPHOS, BILITOT, PROT, ALBUMIN in the last 168 hours. No results for input(s): LIPASE, AMYLASE in the last 168 hours. No results for input(s): AMMONIA in the last 168 hours. Coagulation profile No results for input(s): INR, PROTIME in the last 168 hours.  CBC:  Recent Labs Lab 08/14/15 1415 08/15/15 0535  WBC 11.3* 12.2*  NEUTROABS  --  8.4*  HGB 7.7* 8.4*  HCT 21.9* 23.3*  MCV 99.1 98.7  PLT 364 359   Sepsis Labs:  Recent Labs Lab 08/14/15 1415 08/15/15 0535  WBC 11.3* 12.2*   Microbiology No results found  for this or any previous visit (from the past 240 hour(s)).   Medications:   . enoxaparin (LOVENOX) injection  40 mg Subcutaneous Q24H  . folic acid  2 mg Oral Daily  . HYDROmorphone   Intravenous 6 times per day  . multivitamin-prenatal  1 tablet Oral q morning - 10a  . nitrofurantoin (macrocrystal-monohydrate)  100 mg Oral Q12H  . potassium chloride  40 mEq Oral BID  . senna-docusate  1 tablet Oral BID   Continuous Infusions: . dextrose 5 % and 0.45% NaCl 75 mL/hr at 08/15/15 0357    Time spent: 25 minutes.   LOS: 2 days   [name]  [facility] Pager [phone]. If unable to reach me by pager, please call my cell phone at [phone].  *Please refer to  [email or web address], password [email or web address] to get updated schedule on who will round on this patient, as hospitalists switch teams weekly. If 7PM-7AM, please contact night-coverage at [email or web address], password [email or web address] for any overnight needs.  08/16/2015, 3:15 PM

## 2015-08-16 NOTE — Progress Notes (Signed)
Spoke with Dr. Elonda Husky. Pt is contracting every 5-6 min. She does not feel all of them. Her cervix is closed and thick. No bloody show. FHR tracing is a category 1. Says it is fine to dc FM.

## 2015-08-16 NOTE — Progress Notes (Signed)
Pt is contracting every 2-3.5 min. Says she does not feel all of them. FHR is 140-145 BPM. Minimal variability. One 10x10 accel noted. No decels. Pt tilted to her left side. Says she has not yet eaten anything so I am letting her eat breakfast now.

## 2015-08-16 NOTE — Progress Notes (Signed)
Pt contracting every 5-6 min. Pt says she does not feel all of them. FHR with moderate variability and 15x 15 accels. No decels.

## 2015-08-17 NOTE — Progress Notes (Signed)
Pt denies vaginal bleeding or leaking of fluid. Says she has been feeling her baby move. Says she has been feeling some uc's, but no more than usual. EFM applied.

## 2015-08-17 NOTE — Progress Notes (Signed)
Progress Note   Gabriella Griffin [id number] DOB: 1990-10-07 DOA: 08/14/2015 PCP: No primary care provider on file.   Brief Narrative:   Gabriella Griffin is an 25 y.o. female at [redacted] weeks gestation who was admitted 08/14/15 with sickle cell crisis.  Assessment/Plan:   Principal Problem:   Hb-SS disease with crisis ([place]) - Pain currently reasonably well-controlled on PCA Dilaudid. - Continue folic acid. - Continue MiraLAX and Senokot to prevent opioid-induced constipation. - Hemoglobin stable at 8.4.  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 No decelerations noted.    UTI - Macrobid ordered by OB.    DVT Prophylaxis - Lovenox ordered.   Family Communication/Anticipated D/C date and plan/Code Status   Family Communication: No family currently at the bedside. Disposition Plan: Home when pain controlled and patient functional. Anticipated D/C date:   Possibly tomorrow. Code Status: Full code.   IV Access:    Peripheral IV   Procedures and diagnostic studies:   No results found.   Medical Consultants:    OB-GYN  Anti-Infectives:   Macrobid 08/16/15--->  Subjective:   Gabriella Griffin says her pain is being controlled with the PCA, mainly having lower back pain. Still has some nausea but no vomiting. Continues to report uterine contractions. No bloody show.  Objective:    Filed Vitals:   08/16/15 2139 08/17/15 0000 08/17/15 0151 08/17/15 0535  BP: 149/83  145/86 142/76  Pulse: 88  89 87  Temp: 98.3 F (36.8 C)  98.7 F (37.1 C) 98.2 F (36.8 C)  TempSrc: Oral  Oral Oral  Resp: 18 18  18 18   SpO2: 95% 99% 99% 98%    Intake/Output Summary (Last 24 hours) at 08/17/15 0857 Last data filed at 08/17/15 0536  Gross per 24 hour  Intake   2580 ml  Output      0 ml  Net   2580 ml   There were no vitals filed for this visit.  Exam: Gen:  NAD Cardiovascular:  RRR, No M/R/G Respiratory:  Lungs CTAB Gastrointestinal:  Abdomen protuberant (pregnant), NT/ND, + BS Extremities:  No C/E/C   Data Reviewed:    Labs: Basic Metabolic Panel:  Recent Labs Lab 08/14/15 1415 08/15/15 0535  NA  --  137  K  --  4.2  CL  --  105  CO2  --  23  GLUCOSE  --  76  BUN  --  <5*  CREATININE <0.30* 0.31*  CALCIUM  --  8.5*   GFR Estimated Creatinine Clearance: 89.7 mL/min (by C-G formula based on Cr of 0.31).  CBC:  Recent Labs Lab 08/14/15 1415 08/15/15 0535  WBC 11.3* 12.2*  NEUTROABS  --  8.4*  HGB 7.7* 8.4*  HCT 21.9* 23.3*  MCV 99.1 98.7  PLT 364 359   Sepsis Labs:  Recent Labs Lab 08/14/15 1415 08/15/15 0535  WBC 11.3* 12.2*   Microbiology No results found for this or any previous visit (from the past 240 hour(s)).   Medications:   . enoxaparin (LOVENOX) injection  40 mg Subcutaneous Q24H  . folic acid  2 mg Oral Daily  . HYDROmorphone   Intravenous 6 times per day  .  multivitamin-prenatal  1 tablet Oral q morning - 10a  . nitrofurantoin (macrocrystal-monohydrate)  100 mg Oral Q12H  . potassium chloride  40 mEq Oral BID  . senna-docusate  1 tablet Oral BID   Continuous Infusions: . dextrose 5 % and 0.45% NaCl 75 mL/hr at 08/16/15 1949    Time spent: 25 minutes.   LOS: 3 days   [place] [facility] Pager [phone]. If unable to reach me by pager, please call my cell phone at [phone].  *Please refer to [email or web address], password [email or web address] to get updated schedule on who will round on this patient, as hospitalists switch teams weekly. If 7PM-7AM, please contact night-coverage at [email or web address], password [email or web address] for any overnight  needs.  08/17/2015, 8:57 AM

## 2015-08-17 NOTE — Progress Notes (Signed)
Pt says she does not really feel uc's other than tightening. FHR tracing is a category 1.

## 2015-08-18 DIAGNOSIS — D5700 Hb-SS disease with crisis, unspecified: Secondary | ICD-10-CM

## 2015-08-18 LAB — BASIC METABOLIC PANEL
Anion gap: 6 (ref 5–15)
BUN: 5 mg/dL — ABNORMAL LOW (ref 6–20)
CALCIUM: 8.2 mg/dL — AB (ref 8.9–10.3)
CO2: 22 mmol/L (ref 22–32)
Chloride: 111 mmol/L (ref 101–111)
Creatinine, Ser: 0.47 mg/dL (ref 0.44–1.00)
Glucose, Bld: 82 mg/dL (ref 65–99)
Potassium: 4.6 mmol/L (ref 3.5–5.1)
Sodium: 139 mmol/L (ref 135–145)

## 2015-08-18 LAB — CBC
HEMATOCRIT: 20.7 % — AB (ref 36.0–46.0)
Hemoglobin: 7.1 g/dL — ABNORMAL LOW (ref 12.0–15.0)
MCH: 33.5 pg (ref 26.0–34.0)
MCHC: 34.3 g/dL (ref 30.0–36.0)
MCV: 97.6 fL (ref 78.0–100.0)
PLATELETS: 229 10*3/uL (ref 150–400)
RBC: 2.12 MIL/uL — ABNORMAL LOW (ref 3.87–5.11)
RDW: 25.1 % — AB (ref 11.5–15.5)
WBC: 11.3 10*3/uL — AB (ref 4.0–10.5)

## 2015-08-18 MED ORDER — OXYCODONE HCL 5 MG PO TABS
5.0000 mg | ORAL_TABLET | ORAL | Status: DC | PRN
Start: 2015-08-18 — End: 2015-08-25

## 2015-08-18 NOTE — Progress Notes (Signed)
Pt denies vaginal bleeding or leaking of fluid. Says she has been feeling contractions. Reports good fetal movement.

## 2015-08-18 NOTE — Progress Notes (Signed)
Spoke with Dr. Nehemiah Settle. Pt is 36 5/7 weeks contracting every 2-3 min, not feeling all of them. No vaginal bleeding or leaking of fluid. Cervix is closed, thick, soft, presenting part undeterminable at a -3 station. Pt has a rectum full of stool. Pt is on a stool softener and her RN has ordered Miralax. FHR is a category 1 tracing. Okay for OBRR to sign off.

## 2015-08-18 NOTE — Progress Notes (Signed)
Pt back to bed. Says she voided a small amt.

## 2015-08-18 NOTE — Progress Notes (Signed)
Pt"s rectum is full of stool. Pt's RN says she is on a stool softener, but she will get an order for miralax. Pt encouraged to drink juice.

## 2015-08-18 NOTE — Progress Notes (Signed)
Progress Note   Gabriella Griffin [id number] DOB: Oct 22, 1990 DOA: 08/14/2015 PCP: No primary care provider on file.   Brief Narrative:   Gabriella Griffin is an 25 y.o. female at [redacted] weeks gestation who was admitted 08/14/15 with sickle cell crisis.  Assessment/Plan:   Principal Problem:   Hb-SS disease with crisis ([place]) - Pain currently reasonably well-controlled on PCA Dilaudid. - Continue folic acid. - Continue MiraLAX and Senokot to prevent opioid-induced constipation. - Hemoglobin stable  - Restart home medication and transition off PCA. Anticipated discharge next 2448 hrs.  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 No decelerations noted.    UTI - Macrobid ordered by OB.    DVT Prophylaxis - Lovenox ordered.   Family Communication/Anticipated D/C date and plan/Code Status   Family Communication: No family currently at the bedside. Disposition Plan: Home when pain controlled and patient functional. Anticipated D/C date:   Possibly tomorrow. Code Status: Full code.   IV Access:    Peripheral IV   Procedures and diagnostic studies:   No results found.   Medical Consultants:    OB-GYN  Anti-Infectives:   Macrobid 08/16/15--->  Subjective:   Gabriella Griffin says her pain is being controlled with the PCA, mainly having lower back pain. Still has some nausea but no vomiting. Continues to report uterine contractions. No bloody show. She is 2 weeks out of her due date. She has not been on her oral medications so far  Objective:    Filed Vitals:   08/18/15 0747 08/18/15 1151 08/18/15 1349  08/18/15 1600  BP:   140/79   Pulse:   83   Temp:   98.5 F (36.9 C)   TempSrc:   Oral   Resp:  13 20 21   Height: 5\' 2"  (1.575 m)     Weight: 65.545 kg (144 lb 8 oz)     SpO2:  98% 98% 99%    Intake/Output Summary (Last 24 hours) at 08/18/15 1840 Last data filed at 08/18/15 0600  Gross per 24 hour  Intake 2346.25 ml  Output      0 ml  Net 2346.25 ml   Filed Weights   08/18/15 0747  Weight: 65.545 kg (144 lb 8 oz)    Exam: Gen:  NAD Cardiovascular:  RRR, No M/R/G Respiratory:  Lungs CTAB Gastrointestinal:  Abdomen protuberant (pregnant), NT/ND, + BS Extremities:  No C/E/C   Data Reviewed:    Labs: Basic Metabolic Panel:  Recent Labs Lab 08/14/15 1415 08/15/15 0535 08/18/15 0431  NA  --  137 139  K  --  4.2 4.6  CL  --  105 111  CO2  --  23 22  GLUCOSE  --  76 82  BUN  --  <5* 5*  CREATININE <0.30* 0.31* 0.47  CALCIUM  --  8.5* 8.2*   GFR Estimated Creatinine Clearance: 96.4 mL/min (by C-G formula based on Cr of 0.47).  CBC:  Recent Labs Lab 08/14/15 1415 08/15/15 0535 08/18/15 0431  WBC 11.3* 12.2* 11.3*  NEUTROABS  --  8.4*  --   HGB 7.7* 8.4* 7.1*  HCT 21.9* 23.3* 20.7*  MCV 99.1 98.7 97.6  PLT 364  359 229   Sepsis Labs:  Recent Labs Lab 08/14/15 1415 08/15/15 0535 08/18/15 0431  WBC 11.3* 12.2* 11.3*   Microbiology No results found for this or any previous visit (from the past 240 hour(s)).   Medications:   . enoxaparin (LOVENOX) injection  40 mg Subcutaneous Q24H  . folic acid  2 mg Oral Daily  . HYDROmorphone   Intravenous 6 times per day  . multivitamin-prenatal  1 tablet Oral q morning - 10a  . nitrofurantoin (macrocrystal-monohydrate)  100 mg Oral Q12H  . potassium chloride  40 mEq Oral BID  . senna-docusate  1 tablet Oral BID   Continuous Infusions: . dextrose 5 % and 0.45% NaCl 75 mL/hr at 08/18/15 1151    Time spent: 25 minutes.   LOS: 4 days   [place] [facility] Pager [phone]   *Please  refer to [email or web address], password [email or web address] to get updated schedule on who will round on this patient, as hospitalists switch teams weekly. If 7PM-7AM, please contact night-coverage at [email or web address], password [email or web address] for any overnight needs.  08/18/2015, 6:40 PM

## 2015-08-18 NOTE — Progress Notes (Signed)
Pt up  To bathroom  

## 2015-08-18 NOTE — Progress Notes (Signed)
24 Hour Totals: 17 mg, 36 Demands, 34 Delivered  [place], [place], [place]

## 2015-08-19 ENCOUNTER — Other Ambulatory Visit: Payer: Self-pay

## 2015-08-19 MED ORDER — FAMOTIDINE 20 MG PO TABS
20.0000 mg | ORAL_TABLET | Freq: Two times a day (BID) | ORAL | Status: DC | PRN
  Filled 2015-08-19: qty 1

## 2015-08-19 MED ORDER — FAMOTIDINE 20 MG PO TABS
20.0000 mg | ORAL_TABLET | Freq: Once | ORAL | Status: AC
  Administered 2015-08-19: 20 mg via ORAL
  Filled 2015-08-19: qty 1

## 2015-08-19 NOTE — Care Management Note (Signed)
Case Management Note  Patient Details  Name: Gabriella Griffin MRN: [id number] Date of Birth: November 17, 1990  Subjective/Objective:   admitted with sickle cell crisis                 Action/Plan: Discharge planning, no HH needs identified  Expected Discharge Date:   (unknown)               Expected Discharge Plan:  Home/Self Care  In-House Referral:  NA  Discharge planning Services  CM Consult  Post Acute Care Choice:  NA Choice offered to:  NA  DME Arranged:  N/A DME Agency:  NA  HH Arranged:  NA HH Agency:  NA  Status of Service:  Completed, signed off  Medicare Important Message Given:    Date Medicare IM Given:    Medicare IM give by:    Date Additional Medicare IM Given:    Additional Medicare Important Message give by:     If discussed at [place] of Stay Meetings, dates discussed:    Additional Comments:  Guadalupe Maple, RN 08/19/2015, 10:55 AM

## 2015-08-19 NOTE — Progress Notes (Signed)
24 Hour Totals: 24.5mg , 51 demands, 49 delivered Mihail Prettyman, [place], RN

## 2015-08-19 NOTE — Progress Notes (Signed)
Progress Note   Ladestiny Tschetter [id number] DOB: [birth year] DOA: 08/14/2015 PCP: No primary care provider on file.   Brief Narrative:   Gabriella Griffin is an 25 y.o. female at [redacted] weeks gestation who was admitted 08/14/15 with sickle cell crisis.  Assessment/Plan:   Principal Problem:   Hb-SS disease with crisis ([place]) - Pain currently reasonably well-controlled on PCA Dilaudid.  - Attempting to transition to oral medications, but continues to report back pain. - Continue folic acid. - Continue MiraLAX and Senokot to prevent opioid-induced constipation. - Hemoglobin stable at 8.4.  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 No decelerations noted.    UTI - Macrobid ordered by OB.    DVT Prophylaxis - Lovenox ordered.   Family Communication/Anticipated D/C date and plan/Code Status   Family Communication: No family currently at the bedside. Disposition Plan: Home when pain controlled and patient functional. Anticipated D/C date:   Possibly tomorrow. Code Status: Full code.   IV Access:    Peripheral IV   Procedures and diagnostic studies:   No results found.   Medical Consultants:    OB-GYN  Anti-Infectives:   Macrobid 08/16/15--->  Subjective:   Gabriella Griffin continues to have lower back pain. Continues to report uterine contractions but no bloody show.  Objective:    Filed Vitals:   08/19/15 0619 08/19/15 0742 08/19/15 0951 08/19/15 1200  BP:      Pulse:      Temp:      TempSrc:      Resp: 19 22 16 14   Height:      Weight:      SpO2: 96% 97% 99% 96%    Intake/Output  Summary (Last 24 hours) at 08/19/15 1502 Last data filed at 08/19/15 0547  Gross per 24 hour  Intake   1345 ml  Output      0 ml  Net   1345 ml   Filed Weights   08/18/15 0747  Weight: 65.545 kg (144 lb 8 oz)    Exam: Gen:  NAD Cardiovascular:  RRR, No M/R/G Respiratory:  Lungs CTAB Gastrointestinal:  Abdomen protuberant (pregnant), NT/ND, + BS Extremities:  No C/E/C   Data Reviewed:    Labs: Basic Metabolic Panel:  Recent Labs Lab 08/14/15 1415 08/15/15 0535 08/18/15 0431  NA  --  137 139  K  --  4.2 4.6  CL  --  105 111  CO2  --  23 22  GLUCOSE  --  76 82  BUN  --  <5* 5*  CREATININE <0.30* 0.31* 0.47  CALCIUM  --  8.5* 8.2*   GFR Estimated Creatinine Clearance: 96.4 mL/min (by C-G formula based on Cr of 0.47).  CBC:  Recent Labs Lab 08/14/15 1415 08/15/15 0535 08/18/15 0431  WBC 11.3* 12.2* 11.3*  NEUTROABS  --  8.4*  --   HGB 7.7* 8.4* 7.1*  HCT 21.9* 23.3* 20.7*  MCV 99.1 98.7 97.6  PLT 364 359 229   Sepsis Labs:  Recent Labs Lab 08/14/15 1415 08/15/15 0535 08/18/15 0431  WBC 11.3* 12.2* 11.3*   Microbiology No results found for this or any previous visit (from the past 240 hour(s)).  Medications:   . enoxaparin (LOVENOX) injection  40 mg Subcutaneous Q24H  . folic acid  2 mg Oral Daily  . HYDROmorphone   Intravenous 6 times per day  . multivitamin-prenatal  1 tablet Oral q morning - 10a  . nitrofurantoin (macrocrystal-monohydrate)  100 mg Oral Q12H  . potassium chloride  40 mEq Oral BID  . senna-docusate  1 tablet Oral BID   Continuous Infusions: . dextrose 5 % and 0.45% NaCl 75 mL/hr at 08/18/15 1151    Time spent: 25 minutes.   LOS: 5 days   [place] [facility] Pager [phone]. If unable to reach me by pager, please call my cell phone at [phone].  *Please refer to [email or web address], password [email or web address] to get updated schedule on who will round on this patient, as hospitalists switch teams weekly. If 7PM-7AM, please  contact night-coverage at [email or web address], password [email or web address] for any overnight needs.  08/19/2015, 3:02 PM

## 2015-08-19 NOTE — Progress Notes (Signed)
Pt reports no vaginal bleeding or leaking of fluid.

## 2015-08-20 DIAGNOSIS — D573 Sickle-cell trait: Secondary | ICD-10-CM

## 2015-08-20 DIAGNOSIS — O99013 Anemia complicating pregnancy, third trimester: Secondary | ICD-10-CM

## 2015-08-20 LAB — HEMOGLOBINOPATHY EVALUATION
HGB A2 QUANT: 3.3 % — AB (ref 0.7–3.1)
HGB A: 25.7 % — AB (ref 94.0–98.0)
HGB C: 0 %
HGB F QUANT: 10 % — AB (ref 0.0–2.0)
HGB S QUANTITAION: 61 % — AB

## 2015-08-20 MED ORDER — TERBUTALINE SULFATE 1 MG/ML IJ SOLN
0.2500 mg | Freq: Once | INTRAMUSCULAR | Status: AC
  Administered 2015-08-20: 0.25 mg via SUBCUTANEOUS
  Filled 2015-08-20: qty 1

## 2015-08-20 NOTE — Progress Notes (Signed)
[place] with Dr. Elonda Husky.  Informed of patient history and course of current admission.  Notified of continued UC's tracing on EFM and of multiple SVE's since admission due to UC's.  Orders to repeat SVE today and to give Terbutaline 0.25 mg SQ X 1.  Dr. Elonda Husky also notified of elevated BP's 150's over high 80's.  Orders for 24 hr urine for total protein.  Orders were entered in EPIC and floor nurses notified of plan.  SVE closed still.  Terbutaline given.  Patient educated on signs of pre-eclampsia and on preterm labor.

## 2015-08-20 NOTE — Progress Notes (Signed)
Patient ID: Gabriella Griffin, female   DOB: [birth year], 25 y.o.   MRN: [id number] [facility] PROGRESS NOTE  Gabriella Griffin [id number] DOB: 31-Mar-1991 DOA: 08/14/2015 PCP: Dr. Verlan Friends   Brief narrative:    25 y.o. female at [redacted] weeks gestation who was admitted 08/14/15 with sickle cell crisis.   Assessment/Plan:    Principal Problem:  Hb-SS disease with crisis ([place]) - Pain well-controlled on PCA Dilaudid.  - Continue folic acid. 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 No decelerations noted.   UTI - Continue Macrobid   DVT Prophylaxis  - Lovenox subQ    Code Status: Full.  Family Communication:  plan of care discussed with the patient Disposition Plan: Home once pain controlled, 08/22/2014   IV access:  Peripheral IV  Procedures and diagnostic studies:    No results found.  Medical Consultants:  None   Other Consultants:  None   IAnti-Infectives:   None    Leisa Lenz, MD  [facility] Pager [phone]  Time spent in minutes: 15 minutes  If 7PM-7AM, please contact night-coverage [email or web address] Password [email or web address] 08/20/2015, 2:23 PM   LOS: 6 days    HPI/Subjective: No acute overnight events. Patient reports pain is 7/10.  Objective: Filed Vitals:   08/20/15 0424 08/20/15 0800 08/20/15 0949 08/20/15 1000  BP: 157/93   154/87  Pulse: 75   73  Temp: 98.3 F (36.8 C)   97.9 F (36.6 C)  TempSrc: Oral   Oral  Resp: 20 18 18 17   Height:      Weight:      SpO2: 98% 98% 99% 98%    Intake/Output Summary (Last 24 hours) at 08/20/15  1423 Last data filed at 08/20/15 0833  Gross per 24 hour  Intake    200 ml  Output      0 ml  Net    200 ml    Exam:   General:  Pt is alert, follows commands appropriately, not in acute distress  Cardiovascular: Regular rate and rhythm, S1/S2 (+)  Respiratory: Clear to auscultation bilaterally, no wheezing, no crackles, no rhonchi  Abdomen: Soft, non tender, non distended, bowel sounds present  Extremities: No edema, pulses DP and PT palpable bilaterally  Neuro: Grossly nonfocal  Data Reviewed: Basic Metabolic Panel:  Recent Labs Lab 08/14/15 1415 08/15/15 0535 08/18/15 0431  NA  --  137 139  K  --  4.2 4.6  CL  --  105 111  CO2  --  23 22  GLUCOSE  --  76 82  BUN  --  <5* 5*  CREATININE <0.30* 0.31* 0.47  CALCIUM  --  8.5* 8.2*   Liver Function Tests: No results for input(s): AST, ALT, ALKPHOS, BILITOT, PROT, ALBUMIN in the last 168 hours. No results for input(s): LIPASE, AMYLASE in the last 168 hours. No results for input(s): AMMONIA in the last 168 hours. CBC:  Recent Labs Lab 08/14/15 1415 08/15/15 0535 08/18/15 0431  WBC 11.3* 12.2* 11.3*  NEUTROABS  --  8.4*  --   HGB 7.7* 8.4* 7.1*  HCT 21.9* 23.3* 20.7*  MCV 99.1 98.7 97.6  PLT 364 359 229   Cardiac  Enzymes: No results for input(s): CKTOTAL, CKMB, CKMBINDEX, TROPONINI in the last 168 hours. BNP: Invalid input(s): POCBNP CBG: No results for input(s): GLUCAP in the last 168 hours.  No results found for this or any previous visit (from the past 240 hour(s)).   Scheduled Meds: . enoxaparin (LOVENOX) injection  40 mg Subcutaneous Q24H  . folic acid  2 mg Oral Daily  . HYDROmorphone   Intravenous 6 times per day  . multivitamin-prenatal  1 tablet Oral q morning - 10a  . nitrofurantoin (macrocrystal-monohydrate)  100 mg Oral Q12H  . potassium chloride  40 mEq Oral BID  . senna-docusate  1 tablet Oral BID   Continuous Infusions: . dextrose 5 % and 0.45% NaCl 75 mL/hr at 08/20/15 0543

## 2015-08-21 ENCOUNTER — Encounter (HOSPITAL_COMMUNITY): Payer: Self-pay

## 2015-08-21 DIAGNOSIS — Z3A37 37 weeks gestation of pregnancy: Secondary | ICD-10-CM

## 2015-08-21 DIAGNOSIS — O133 Gestational [pregnancy-induced] hypertension without significant proteinuria, third trimester: Secondary | ICD-10-CM

## 2015-08-21 DIAGNOSIS — O99013 Anemia complicating pregnancy, third trimester: Secondary | ICD-10-CM

## 2015-08-21 DIAGNOSIS — D5700 Hb-SS disease with crisis, unspecified: Secondary | ICD-10-CM

## 2015-08-21 DIAGNOSIS — N390 Urinary tract infection, site not specified: Secondary | ICD-10-CM

## 2015-08-21 LAB — PREPARE RBC (CROSSMATCH)

## 2015-08-21 LAB — COMPREHENSIVE METABOLIC PANEL
ALT: 31 U/L (ref 14–54)
ALT: 33 U/L (ref 14–54)
ANION GAP: 8 (ref 5–15)
AST: 110 U/L — AB (ref 15–41)
AST: 117 U/L — ABNORMAL HIGH (ref 15–41)
Albumin: 2.2 g/dL — ABNORMAL LOW (ref 3.5–5.0)
Albumin: 2.3 g/dL — ABNORMAL LOW (ref 3.5–5.0)
Alkaline Phosphatase: 202 U/L — ABNORMAL HIGH (ref 38–126)
Alkaline Phosphatase: 216 U/L — ABNORMAL HIGH (ref 38–126)
Anion gap: 7 (ref 5–15)
BILIRUBIN TOTAL: 3.8 mg/dL — AB (ref 0.3–1.2)
BILIRUBIN TOTAL: 3.8 mg/dL — AB (ref 0.3–1.2)
BUN: 6 mg/dL (ref 6–20)
BUN: 6 mg/dL (ref 6–20)
CHLORIDE: 107 mmol/L (ref 101–111)
CHLORIDE: 108 mmol/L (ref 101–111)
CO2: 22 mmol/L (ref 22–32)
CO2: 20 mmol/L — ABNORMAL LOW (ref 22–32)
CREATININE: 0.6 mg/dL (ref 0.44–1.00)
Calcium: 8.1 mg/dL — ABNORMAL LOW (ref 8.9–10.3)
Calcium: 8.3 mg/dL — ABNORMAL LOW (ref 8.9–10.3)
Creatinine, Ser: 0.62 mg/dL (ref 0.44–1.00)
Glucose, Bld: 78 mg/dL (ref 65–99)
Glucose, Bld: 83 mg/dL (ref 65–99)
POTASSIUM: 5.4 mmol/L — AB (ref 3.5–5.1)
POTASSIUM: 5.4 mmol/L — AB (ref 3.5–5.1)
Sodium: 136 mmol/L (ref 135–145)
Sodium: 136 mmol/L (ref 135–145)
TOTAL PROTEIN: 6.3 g/dL — AB (ref 6.5–8.1)
TOTAL PROTEIN: 6.3 g/dL — AB (ref 6.5–8.1)

## 2015-08-21 LAB — CREATININE, SERUM: Creatinine, Ser: 0.5 mg/dL (ref 0.44–1.00)

## 2015-08-21 LAB — CBC
HEMATOCRIT: 21.8 % — AB (ref 36.0–46.0)
HEMATOCRIT: 22.4 % — AB (ref 36.0–46.0)
HEMOGLOBIN: 7.4 g/dL — AB (ref 12.0–15.0)
HEMOGLOBIN: 7.7 g/dL — AB (ref 12.0–15.0)
MCH: 32.9 pg (ref 26.0–34.0)
MCH: 32.9 pg (ref 26.0–34.0)
MCHC: 33.9 g/dL (ref 30.0–36.0)
MCHC: 34.4 g/dL (ref 30.0–36.0)
MCV: 96.9 fL (ref 78.0–100.0)
MCV: 95.7 fL (ref 78.0–100.0)
Platelets: 254 10*3/uL (ref 150–400)
Platelets: 263 10*3/uL (ref 150–400)
RBC: 2.25 MIL/uL — ABNORMAL LOW (ref 3.87–5.11)
RBC: 2.34 MIL/uL — AB (ref 3.87–5.11)
RDW: 23.8 % — ABNORMAL HIGH (ref 11.5–15.5)
RDW: 23.6 % — AB (ref 11.5–15.5)
WBC: 10.6 10*3/uL — AB (ref 4.0–10.5)
WBC: 11.3 10*3/uL — AB (ref 4.0–10.5)

## 2015-08-21 LAB — URIC ACID: URIC ACID, SERUM: 8.2 mg/dL — AB (ref 2.3–6.6)

## 2015-08-21 LAB — OB RESULTS CONSOLE GBS: STREP GROUP B AG: NEGATIVE

## 2015-08-21 LAB — PROTEIN, URINE, 24 HOUR
Collection Interval-UPROT: 24 hours
PROTEIN, 24H URINE: 2667 mg/d — AB (ref 50–100)
PROTEIN, URINE: 127 mg/dL
URINE TOTAL VOLUME-UPROT: 2100 mL

## 2015-08-21 LAB — LACTATE DEHYDROGENASE: LDH: 1124 U/L — AB (ref 98–192)

## 2015-08-21 MED ORDER — LIDOCAINE HCL (PF) 1 % IJ SOLN
30.0000 mL | INTRAMUSCULAR | Status: DC | PRN
  Filled 2015-08-21: qty 30

## 2015-08-21 MED ORDER — FENTANYL 2.5 MCG/ML BUPIVACAINE 1/10 % EPIDURAL INFUSION (WH - ANES)
14.0000 mL/h | INTRAMUSCULAR | Status: DC | PRN
  Filled 2015-08-21 (×7): qty 125

## 2015-08-21 MED ORDER — LABETALOL HCL 5 MG/ML IV SOLN
20.0000 mg | INTRAVENOUS | Status: AC | PRN
  Administered 2015-08-21 – 2015-08-22 (×3): 20 mg via INTRAVENOUS
  Filled 2015-08-21 (×2): qty 4
  Filled 2015-08-21: qty 12

## 2015-08-21 MED ORDER — MISOPROSTOL 25 MCG QUARTER TABLET
25.0000 ug | ORAL_TABLET | ORAL | Status: DC | PRN
  Administered 2015-08-21 – 2015-08-22 (×6): 25 ug via VAGINAL
  Filled 2015-08-21 (×5): qty 0.25

## 2015-08-21 MED ORDER — OXYTOCIN BOLUS FROM INFUSION: 500.0000 mL | INTRAVENOUS | Status: DC

## 2015-08-21 MED ORDER — SODIUM CHLORIDE 0.9 % IV SOLN: Freq: Once | INTRAVENOUS | Status: DC

## 2015-08-21 MED ORDER — ZOLPIDEM TARTRATE 5 MG PO TABS: 5.0000 mg | ORAL_TABLET | Freq: Every evening | ORAL | Status: DC | PRN

## 2015-08-21 MED ORDER — EPHEDRINE 5 MG/ML INJ: 10.0000 mg | INTRAVENOUS | Status: DC | PRN

## 2015-08-21 MED ORDER — OXYTOCIN 10 UNIT/ML IJ SOLN
2.5000 [IU]/h | INTRAVENOUS | Status: DC
  Filled 2015-08-21 (×3): qty 4

## 2015-08-21 MED ORDER — DIPHENHYDRAMINE HCL 50 MG/ML IJ SOLN: 12.5000 mg | INTRAMUSCULAR | Status: DC | PRN

## 2015-08-21 MED ORDER — MAGNESIUM SULFATE BOLUS VIA INFUSION
4.0000 g | Freq: Once | INTRAVENOUS | Status: AC
  Administered 2015-08-21: 4 g via INTRAVENOUS
  Filled 2015-08-21: qty 500

## 2015-08-21 MED ORDER — MAGNESIUM SULFATE 50 % IJ SOLN
1.0000 g/h | INTRAVENOUS | Status: DC
  Administered 2015-08-21: 2 g/h via INTRAVENOUS
  Administered 2015-08-24: 1 g/h via INTRAVENOUS
  Filled 2015-08-21 (×4): qty 80

## 2015-08-21 MED ORDER — PHENYLEPHRINE 40 MCG/ML (10ML) SYRINGE FOR IV PUSH (FOR BLOOD PRESSURE SUPPORT)
80.0000 ug | PREFILLED_SYRINGE | INTRAVENOUS | Status: DC | PRN
  Filled 2015-08-21: qty 20

## 2015-08-21 MED ORDER — FLEET ENEMA 7-19 GM/118ML RE ENEM: 1.0000 | ENEMA | RECTAL | Status: DC | PRN

## 2015-08-21 MED ORDER — HYDRALAZINE HCL 20 MG/ML IJ SOLN
10.0000 mg | Freq: Once | INTRAMUSCULAR | Status: AC | PRN
  Administered 2015-08-24: 10 mg via INTRAVENOUS
  Filled 2015-08-21: qty 1

## 2015-08-21 MED ORDER — TERBUTALINE SULFATE 1 MG/ML IJ SOLN: 0.2500 mg | Freq: Once | INTRAMUSCULAR | Status: DC | PRN

## 2015-08-21 MED ORDER — LACTATED RINGERS IV SOLN
500.0000 mL | INTRAVENOUS | Status: DC | PRN
  Administered 2015-08-23: 250 mL via INTRAVENOUS
  Administered 2015-08-23: 100 mL via INTRAVENOUS
  Administered 2015-08-23: 500 mL via INTRAVENOUS
  Administered 2015-08-24: 300 mL via INTRAVENOUS

## 2015-08-21 MED ORDER — CITRIC ACID-SODIUM CITRATE 334-500 MG/5ML PO SOLN
30.0000 mL | ORAL | Status: DC | PRN
  Administered 2015-08-25: 30 mL via ORAL
  Filled 2015-08-21: qty 15

## 2015-08-21 MED ORDER — MISOPROSTOL 25 MCG QUARTER TABLET
25.0000 ug | ORAL_TABLET | ORAL | Status: DC
  Administered 2015-08-23: 25 ug via VAGINAL
  Filled 2015-08-21 (×2): qty 0.25

## 2015-08-21 NOTE — Progress Notes (Signed)
Results for orders placed or performed during the hospital encounter of 08/14/15 (from the past 24 hour(s))  OB RESULT CONSOLE Group B Strep     Status: None   Collection Time: 08/21/15 12:00 AM  Result Value Ref Range   GBS Negative   Creatinine, serum     Status: None   Collection Time: 08/21/15  5:21 AM  Result Value Ref Range   Creatinine, Ser 0.50 0.44 - 1.00 mg/dL   GFR calc non Af Amer >60 >60 mL/min   GFR calc Af Amer >60 >60 mL/min  Comprehensive metabolic panel     Status: Abnormal   Collection Time: 08/21/15  7:30 PM  Result Value Ref Range   Sodium 136 135 - 145 mmol/L   Potassium 5.4 (H) 3.5 - 5.1 mmol/L   Chloride 107 101 - 111 mmol/L   CO2 22 22 - 32 mmol/L   Glucose, Bld 78 65 - 99 mg/dL   BUN 6 6 - 20 mg/dL   Creatinine, Ser 0.60 0.44 - 1.00 mg/dL   Calcium 8.1 (L) 8.9 - 10.3 mg/dL   Total Protein 6.3 (L) 6.5 - 8.1 g/dL   Albumin 2.2 (L) 3.5 - 5.0 g/dL   AST 110 (H) 15 - 41 U/L   ALT 31 14 - 54 U/L   Alkaline Phosphatase 202 (H) 38 - 126 U/L   Total Bilirubin 3.8 (H) 0.3 - 1.2 mg/dL   GFR calc non Af Amer >60 >60 mL/min   GFR calc Af Amer >60 >60 mL/min   Anion gap 7 5 - 15  Lactate dehydrogenase     Status: Abnormal   Collection Time: 08/21/15  7:30 PM  Result Value Ref Range   LDH 1124 (H) 98 - 192 U/L  Uric acid     Status: Abnormal   Collection Time: 08/21/15  7:30 PM  Result Value Ref Range   Uric Acid, Serum 8.2 (H) 2.3 - 6.6 mg/dL  Type and screen [place]     Status: None (Preliminary result)   Collection Time: 08/21/15  7:30 PM  Result Value Ref Range   ABO/RH(D) O POS    Antibody Screen NEG    Sample Expiration 08/24/2015    Unit Number [id number]    Blood Component Type RED CELLS,LR    Unit division 00    Status of Unit REL FROM [facility]    Transfusion Status OK TO TRANSFUSE    Crossmatch Result Compatible    Unit Number [id number]    Blood Component Type RBC LR PHER1    Unit division 00    Status of Unit  ALLOCATED    Transfusion Status OK TO TRANSFUSE    Crossmatch Result Compatible    Unit Number [id number]    Blood Component Type RED CELLS,LR    Unit division 00    Status of Unit ISSUED    Transfusion Status OK TO TRANSFUSE    Crossmatch Result Compatible   CBC     Status: Abnormal   Collection Time: 08/21/15  7:30 PM  Result Value Ref Range   WBC 10.6 (H) 4.0 - 10.5 K/uL   RBC 2.25 (L) 3.87 - 5.11 MIL/uL   Hemoglobin 7.4 (L) 12.0 - 15.0 g/dL   HCT 21.8 (L) 36.0 - 46.0 %   MCV 96.9 78.0 - 100.0 fL   MCH 32.9 26.0 - 34.0 pg   MCHC 33.9 30.0 - 36.0 g/dL   RDW 23.8 (H) 11.5 - 15.5 %  Platelets 254 150 - 400 K/uL  ABO/Rh     Status: None (Preliminary result)   Collection Time: 08/21/15  7:30 PM  Result Value Ref Range   ABO/RH(D) O POS   Prepare RBC     Status: None   Collection Time: 08/21/15  8:00 PM  Result Value Ref Range   Order Confirmation ORDER PROCESSED BY BLOOD BANK   Comprehensive metabolic panel     Status: Abnormal   Collection Time: 08/21/15  8:49 PM  Result Value Ref Range   Sodium 136 135 - 145 mmol/L   Potassium 5.4 (H) 3.5 - 5.1 mmol/L   Chloride 108 101 - 111 mmol/L   CO2 20 (L) 22 - 32 mmol/L   Glucose, Bld 83 65 - 99 mg/dL   BUN 6 6 - 20 mg/dL   Creatinine, Ser 0.62 0.44 - 1.00 mg/dL   Calcium 8.3 (L) 8.9 - 10.3 mg/dL   Total Protein 6.3 (L) 6.5 - 8.1 g/dL   Albumin 2.3 (L) 3.5 - 5.0 g/dL   AST 117 (H) 15 - 41 U/L   ALT 33 14 - 54 U/L   Alkaline Phosphatase 216 (H) 38 - 126 U/L   Total Bilirubin 3.8 (H) 0.3 - 1.2 mg/dL   GFR calc non Af Amer >60 >60 mL/min   GFR calc Af Amer >60 >60 mL/min   Anion gap 8 5 - 15  CBC     Status: Abnormal   Collection Time: 08/21/15  8:49 PM  Result Value Ref Range   WBC 11.3 (H) 4.0 - 10.5 K/uL   RBC 2.34 (L) 3.87 - 5.11 MIL/uL   Hemoglobin 7.7 (L) 12.0 - 15.0 g/dL   HCT 22.4 (L) 36.0 - 46.0 %   MCV 95.7 78.0 - 100.0 fL   MCH 32.9 26.0 - 34.0 pg   MCHC 34.4 30.0 - 36.0 g/dL   RDW 23.6 (H) 11.5 - 15.5 %    Platelets 263 150 - 400 K/uL   Today's Vitals   08/21/15 2228 08/21/15 2229 08/21/15 2232 08/21/15 2233  BP: 151/103  162/104   Pulse: 80 83 75 77  Temp:      TempSrc:      Resp:      Height:      Weight:      SpO2: 92%   94%  PainSc:         Preeclampsia w/severe features.  MgS04 started.  Dr Kennon Rounds aware.

## 2015-08-21 NOTE — Care Management Note (Signed)
Case Management Note  Patient Details  Name: Gabriella Griffin MRN: [id number] Date of Birth: [birth year]  Subjective/Objective:                    Action/Plan:   Expected Discharge Date:   (unknown)               Expected Discharge Plan:  Home/Self Care  In-House Referral:  NA  Discharge planning Services  CM Consult  Post Acute Care Choice:  NA Choice offered to:  NA  DME Arranged:  N/A DME Agency:  NA  HH Arranged:  NA HH Agency:  NA  Status of Service:  Completed, signed off  Medicare Important Message Given:    Date Medicare IM Given:    Medicare IM give by:    Date Additional Medicare IM Given:    Additional Medicare Important Message give by:     If discussed at [place] of Stay Meetings, dates discussed:  08/21/2015   Additional Comments:  Delrae Sawyers, RN 08/21/2015, 11:21 AM

## 2015-08-21 NOTE — Progress Notes (Signed)
Pt left the unit in stable condition to [facility] hospital via ambulance.

## 2015-08-21 NOTE — H&P (Signed)
LABOR ADMISSION HISTORY AND PHYSICAL  Teren Kandler is a 25 y.o. female G1P0 with IUP at [redacted]w[redacted]d by LMP and [redacted]w[redacted]d [place] transferred from [facility] for IOL for gestational HTN. Of note, her medical history is significant for Sickle Cell SS (admitted to [facility] 12/29 for management of sickle cell crisis and transferred today), lag in prenatal care, hx PUPP resolved with Benadryl.     During her hospital course at [facility], patient's pain was treated with Dilaudid PCA (which was continued at transfer). She was also treated with Macrobid for UTI (will complete 08/22/15). During her hospitalization at [facility], patient complained of contractions but no changes to her cervix was noted (closed and thick). She was given Terbutaline 0.25mg  x 1 (08/19/14). Per chart review patient was noted to have elevated BP 150s/80s (08/19/14) which continued onto today. 24 hr urine protein was ordered (completed today, 08/20/14)  Patient reports +FM, + contractions (every 5 mins), No LOF, no VB, no blurry vision, headaches or peripheral edema, and RUQ pain.  She plans on breast feeding.  Dating: By LMP and [id number] --->  Estimated Date of Delivery: 09/10/15  Sono:    @[redacted]w[redacted]d , CWD, normal anatomy, variable presentation, 250g, 53% EFW   Prenatal History/Complications: As noted above, Sickle Cell SS (admitted to [facility] 12/29 for management of sickle cell crisis and transferred today), lag in prenatal care, hx PUPP resolved with Benadryl.  Past Medical History: Past Medical History  Diagnosis Date  . Sickle cell anemia (HCC)   . Sickle cell anemia ([place]) 1996    Past Surgical History: Past Surgical History  Procedure Laterality Date  . Cholecystectomy  2007    Obstetrical History: OB History    Gravida Para Term Preterm AB TAB SAB Ectopic Multiple Living   1               Social History: Social History   Social History  . Marital Status: Single    Spouse Name: N/A  . Number of Children: N/A  . Years  of Education: N/A   Social History Main Topics  . Smoking status: Never Smoker   . Smokeless tobacco: None  . Alcohol Use: No  . Drug Use: No  . Sexual Activity: Yes    Birth Control/ Protection: None   Other Topics Concern  . None   Social History Narrative    Family History: Family History  Problem Relation Age of Onset  . Sickle cell anemia Sister   . Diabetes Paternal Grandmother   . Sickle cell trait Mother   . Sickle cell trait Father     Allergies: Allergies  Allergen Reactions  . Ceftin [Cefuroxime Axetil] Rash    Prescriptions prior to admission  Medication Sig Dispense Refill Last Dose  . acetaminophen (TYLENOL) 500 MG tablet Take 1,000 mg by mouth every 6 (six) hours as needed for mild pain.   unknown  . diphenhydrAMINE (BENADRYL) 25 mg capsule Take 25 mg by mouth every 6 (six) hours as needed for itching (rash).    08/13/2015 at Unknown time  . folic acid (FOLVITE) 1 MG tablet Take 2 tablets (2 mg total) by mouth daily. (Patient taking differently: Take 1 mg by mouth daily. ) 60 tablet 3 08/13/2015 at Unknown time  . hydrocortisone cream 1 % Apply 1 application topically 2 (two) times daily as needed for itching (rash).    08/13/2015 at Unknown time  . oxyCODONE (OXY IR/ROXICODONE) 5 MG immediate release tablet Take 5-10 mg  by mouth every 6 (six) hours as needed. pain  0 08/13/2015 at Unknown time  . Prenatal Vit-Fe Fumarate-FA (MULTIVITAMIN-PRENATAL) 27-0.8 MG TABS tablet Take 1 tablet by mouth every morning.    08/13/2015 at Unknown time  . [DISCONTINUED] oxycodone (OXY-IR) 5 MG capsule Take 5-10 mg by mouth every 4 (four) hours as needed (moderate pain).   08/13/2015 at Unknown time     Review of Systems   All systems reviewed and negative except as stated in HPI  BP 158/92 mmHg  Pulse 82  Temp(Src) 98 F (36.7 C) (Oral)  Resp 18  Ht 5\' 2"  (1.575 m)  Wt 65.545 kg (144 lb 8 oz)  BMI 26.42 kg/m2  SpO2 98%  LMP 12/04/2014 General appearance: alert,  cooperative and scleral icterus Lungs: clear to auscultation bilaterally Heart: regular rate and rhythm, ? [id number] systolic murmur best heard in left lower sternal border  Abdomen: soft, non-tender; bowel sounds normal Extremities: Homans sign is negative, no sign of DVT; pitting edema noted bilatearally Fetal monitoringBaseline: 130 bpm, Variability: Good {> 6 bpm) and Accelerations: Reactive Uterine activity: Frequency 1-3 min, Quality Mild  Dilation: Closed Effacement (%): 50 Station: Ballotable Exam by:: Limmie Patricia, RNC   Prenatal labs: ABO, Rh: O/POS/-- (06/21 1631) Antibody: NEG (06/21 1631) Rubella: Immune RPR: NON REAC (12/21 1225)  HBsAg: NEGATIVE (06/21 1631)  HIV: NONREACTIVE (12/21 1225)  GBS:   negative 1 hr Glucola 122 Genetic screening: NT- normal Anatomy US normal  Prenatal Transfer Tool  Maternal Diabetes: No Genetic Screening: NT-normal Maternal Ultrasounds/Referrals: Normal Fetal Ultrasounds or other Referrals:  None Maternal Substance Abuse:  Opioid dependent Significant Maternal Medications:  None Significant Maternal Lab Results: None  Results for orders placed or performed during the hospital encounter of 08/14/15 (from the past 24 hour(s))  Creatinine, serum   Collection Time: 08/21/15  5:21 AM  Result Value Ref Range   Creatinine, Ser 0.50 0.44 - 1.00 mg/dL   GFR calc non Af Amer >60 >60 mL/min   GFR calc Af Amer >60 >60 mL/min    Patient Active Problem List   Diagnosis Date Noted  . Hb-SS disease with crisis ([place]) 08/14/2015  . Sickle cell anemia with pain ([place]) 07/13/2015  . Sickle cell crisis ([place]) 06/11/2015  . 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 Leukocytosis 06/11/2015  . Hereditary disease in family possibly affecting fetus, affecting management of mother, antepartum condition or complication [id number]  . Supervision of high-risk pregnancy 02/04/2015  . Maternal  sickle cell anemia complicating pregnancy ([place]) 02/04/2015    Assessment: Jearline Lapan is a 25 y.o. G1P0 at [redacted]w[redacted]d here for IOL for gestational HTN  #Labor: will Induce for GHTN with Cytotec q 4 hr PRN initially; Pre-E labs ordered.  #Pain: epidural available if patient prefers #FWB: Cat 1  #ID: GBS negative; Currently being treated for UTI with Macrobid (started at [facility]; last dose 08/21/15) #Sickle Cell Disease: Dilaudid PCA (consult for sickle cell PCA) #MOF: breast #MOC: did not discuss #Circ:  N/A  Smiley Houseman, MD PGY1 Family Medicine

## 2015-08-21 NOTE — Progress Notes (Addendum)
Dr. Kennon Rounds ok with pt to be transferred to [facility] Transfer order placed Pain to be managed by OBGYN team If you need assistance please call sickle cell coverage or for today [phone] or [place]

## 2015-08-21 NOTE — Progress Notes (Signed)
[id number]  Spoke with Dr. Kennon Rounds regarding patient's continued elevated BP's with SBP's in the high 150's and DBP's in the 90's.  Also regarding continued UC's, that are present at each monitoring session.  Dr. Kennon Rounds recommends transfer to [facility] antenatal unit at this time.  She will contact Dr. Charlies Silvers.  I will contact Antenatal for a bed and to give report.

## 2015-08-21 NOTE — Progress Notes (Signed)
Pt to be transferred to [facility] hospital per MD. Report called to Cyril Mourning RN at [facility] hospital labor and delivery. Pt awaiting [facility] for transportation.

## 2015-08-21 NOTE — Progress Notes (Signed)
Patient ID: Gabriella Griffin, female   DOB: 17-Sep-1990, 25 y.o.   MRN: [id number] [facility] PROGRESS NOTE  Malaak Ebeling [id number] DOB: 01/05/1991 DOA: 08/14/2015 PCP: Dr. Verlan Friends   Brief narrative:    25 y.o. female at [redacted] weeks gestation who was admitted 08/14/15 with sickle cell crisis.   Assessment/Plan:    Principal Problem:  Hb-SS disease with crisis ([place]) - Continue PCA Dilaudid. Pain is 8/10 this am.  - Continue folic acid. 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 No decelerations noted.   UTI - Continue Macrobid   DVT Prophylaxis  - Lovenox subQ    Code Status: Full.  Family Communication:  plan of care discussed with the patient Disposition Plan: Home once pain controlled, 08/22/2014   IV access:  Peripheral IV  Procedures and diagnostic studies:    No results found.  Medical Consultants:  None   Other Consultants:  None   IAnti-Infectives:   None    Leisa Lenz, MD  [facility] Pager [phone]  Time spent in minutes: 15 minutes  If 7PM-7AM, please contact night-coverage [email or web address] Password [email or web address] 08/21/2015, 2:21 PM   LOS: 7 days    HPI/Subjective: No acute overnight events. Patient reports pain is 8/10.  Objective: Filed Vitals:   08/21/15 0753 08/21/15 1016 08/21/15 1200 08/21/15 1417  BP:  157/87  154/97  Pulse:  82  78  Temp:  98.2 F (36.8 C)  98 F (36.7 C)  TempSrc:  Oral  Oral  Resp: 18 18 16 16   Height:      Weight:      SpO2: 97% 97% 97% 98%    Intake/Output Summary (Last 24 hours) at 08/21/15  1421 Last data filed at 08/21/15 1059  Gross per 24 hour  Intake    820 ml  Output      0 ml  Net    820 ml    Exam:   General:  Pt is alert, not in acute distress  Cardiovascular: RRR, S1/S2 appreciated   Respiratory: No wheezing, no crackles, no rhonchi  Abdomen: Non tender, (+) bowel sounds   Extremities: No leg swelling, pulses palpable bilaterally  Neuro: Nonfocal  Data Reviewed: Basic Metabolic Panel:  Recent Labs Lab 08/15/15 0535 08/18/15 0431 08/21/15 0521  NA 137 139  --   K 4.2 4.6  --   CL 105 111  --   CO2 23 22  --   GLUCOSE 76 82  --   BUN <5* 5*  --   CREATININE 0.31* 0.47 0.50  CALCIUM 8.5* 8.2*  --    Liver Function Tests: No results for input(s): AST, ALT, ALKPHOS, BILITOT, PROT, ALBUMIN in the last 168 hours. No results for input(s): LIPASE, AMYLASE in the last 168 hours. No results for input(s): AMMONIA in the last 168 hours. CBC:  Recent Labs Lab 08/15/15 0535 08/18/15 0431  WBC 12.2* 11.3*  NEUTROABS 8.4*  --   HGB 8.4* 7.1*  HCT 23.3* 20.7*  MCV 98.7 97.6  PLT 359 229   Cardiac Enzymes: No results for input(s): CKTOTAL, CKMB, CKMBINDEX, TROPONINI in the last 168 hours. BNP: Invalid input(s): POCBNP CBG: No results  for input(s): GLUCAP in the last 168 hours.  No results found for this or any previous visit (from the past 240 hour(s)).   Scheduled Meds: . enoxaparin (LOVENOX) injection  40 mg Subcutaneous Q24H  . folic acid  2 mg Oral Daily  . HYDROmorphone   Intravenous 6 times per day  . multivitamin-prenatal  1 tablet Oral q morning - 10a  . nitrofurantoin (macrocrystal-monohydrate)  100 mg Oral Q12H  . potassium chloride  40 mEq Oral BID  . senna-docusate  1 tablet Oral BID   Continuous Infusions: . dextrose 5 % and 0.45% NaCl 75 mL/hr at 08/21/15 0859

## 2015-08-22 DIAGNOSIS — Z3A37 37 weeks gestation of pregnancy: Secondary | ICD-10-CM

## 2015-08-22 DIAGNOSIS — O99013 Anemia complicating pregnancy, third trimester: Secondary | ICD-10-CM

## 2015-08-22 DIAGNOSIS — D5700 Hb-SS disease with crisis, unspecified: Secondary | ICD-10-CM

## 2015-08-22 DIAGNOSIS — O133 Gestational [pregnancy-induced] hypertension without significant proteinuria, third trimester: Secondary | ICD-10-CM

## 2015-08-22 LAB — COMPREHENSIVE METABOLIC PANEL
ALBUMIN: 2.6 g/dL — AB (ref 3.5–5.0)
ALT: 36 U/L (ref 14–54)
ANION GAP: 10 (ref 5–15)
AST: 128 U/L — ABNORMAL HIGH (ref 15–41)
Alkaline Phosphatase: 217 U/L — ABNORMAL HIGH (ref 38–126)
BILIRUBIN TOTAL: 4.1 mg/dL — AB (ref 0.3–1.2)
BUN: 5 mg/dL — ABNORMAL LOW (ref 6–20)
CO2: 20 mmol/L — ABNORMAL LOW (ref 22–32)
Calcium: 8 mg/dL — ABNORMAL LOW (ref 8.9–10.3)
Chloride: 104 mmol/L (ref 101–111)
Creatinine, Ser: 0.58 mg/dL (ref 0.44–1.00)
GLUCOSE: 80 mg/dL (ref 65–99)
POTASSIUM: 4.9 mmol/L (ref 3.5–5.1)
Sodium: 134 mmol/L — ABNORMAL LOW (ref 135–145)
TOTAL PROTEIN: 7 g/dL (ref 6.5–8.1)

## 2015-08-22 LAB — CBC
HEMATOCRIT: 31.7 % — AB (ref 36.0–46.0)
HEMOGLOBIN: 11 g/dL — AB (ref 12.0–15.0)
MCH: 31.8 pg (ref 26.0–34.0)
MCHC: 34.7 g/dL (ref 30.0–36.0)
MCV: 91.6 fL (ref 78.0–100.0)
Platelets: 270 10*3/uL (ref 150–400)
RBC: 3.46 MIL/uL — AB (ref 3.87–5.11)
RDW: 21.6 % — ABNORMAL HIGH (ref 11.5–15.5)
WBC: 14 10*3/uL — AB (ref 4.0–10.5)

## 2015-08-22 LAB — MAGNESIUM: Magnesium: 6.8 mg/dL (ref 1.7–2.4)

## 2015-08-22 LAB — RPR: RPR: NONREACTIVE

## 2015-08-22 LAB — ABO/RH: ABO/RH(D): O POS

## 2015-08-22 MED ORDER — LABETALOL HCL 200 MG PO TABS
200.0000 mg | ORAL_TABLET | Freq: Two times a day (BID) | ORAL | Status: DC
  Administered 2015-08-22 – 2015-08-24 (×4): 200 mg via ORAL
  Filled 2015-08-22 (×6): qty 1

## 2015-08-22 MED ORDER — LACTATED RINGERS IV SOLN
INTRAVENOUS | Status: DC
  Administered 2015-08-21 – 2015-08-23 (×4): via INTRAVENOUS
  Administered 2015-08-24: 60 mL/h via INTRAVENOUS

## 2015-08-22 MED ORDER — ONDANSETRON HCL 4 MG/2ML IJ SOLN
4.0000 mg | INTRAMUSCULAR | Status: DC | PRN
  Administered 2015-08-22 – 2015-08-23 (×5): 4 mg via INTRAVENOUS
  Filled 2015-08-22 (×6): qty 2

## 2015-08-22 MED ORDER — FENTANYL CITRATE (PF) 100 MCG/2ML IJ SOLN
100.0000 ug | INTRAMUSCULAR | Status: DC | PRN
  Filled 2015-08-22: qty 2

## 2015-08-22 MED ORDER — HYDROMORPHONE HCL 1 MG/ML IJ SOLN
INTRAMUSCULAR | Status: AC
  Administered 2015-08-22: 2 mg via INTRAVENOUS
  Filled 2015-08-22: qty 2

## 2015-08-22 MED ORDER — HYDROMORPHONE HCL 1 MG/ML IJ SOLN
1.0000 mg | INTRAMUSCULAR | Status: DC | PRN
  Administered 2015-08-22: 1 mg via INTRAVENOUS
  Administered 2015-08-22 (×5): 2 mg via INTRAVENOUS
  Administered 2015-08-22: 1 mg via INTRAVENOUS
  Administered 2015-08-22: 2 mg via INTRAVENOUS
  Administered 2015-08-23 (×2): 1 mg via INTRAVENOUS
  Administered 2015-08-23: 2 mg via INTRAVENOUS
  Administered 2015-08-25 (×3): 1 mg via INTRAVENOUS
  Administered 2015-08-25: 2 mg via INTRAVENOUS
  Administered 2015-08-26: 1 mg via INTRAVENOUS
  Filled 2015-08-22 (×2): qty 1
  Filled 2015-08-22 (×5): qty 2
  Filled 2015-08-22 (×2): qty 1
  Filled 2015-08-22 (×3): qty 2
  Filled 2015-08-22 (×3): qty 1

## 2015-08-22 NOTE — Progress Notes (Signed)
Pt in bathroom doing peri-care.

## 2015-08-22 NOTE — Progress Notes (Signed)
0950 pt told lab she did not need lab work. She had labs drawn last night and they had plenty of tubes downstairs. Explained to pt the importance of having accurate post-transfusion lab work. Pt asked if they could just draw from one of her IV's and I explained that they could not. She said the lab could come after she finished making her mother n laws appt on the bedside commode in 30 mins.

## 2015-08-22 NOTE — Progress Notes (Signed)
CRITICAL LAB VALUE CALLED TO lORI CLEMMONS, CNM MAGNESIUM LEVEL. NO NEW ORDERS RECEIVED. CONTINUE TO MONITOR PATIENT.

## 2015-08-22 NOTE — Progress Notes (Signed)
Patient ID: Pegah Suchodolski, female   DOB: [birth year], 25 y.o.   MRN: [id number] RN states patient is complaining of blurred vision  Filed Vitals:   08/22/15 2213 08/22/15 2214 08/22/15 2218 08/22/15 2232  BP:  165/97  117/92  Pulse: 86 79 77 77  Temp:  97.6 F (36.4 C)    TempSrc:      Resp:      Height:      Weight:      SpO2: 95%  95%    Recent Magnesium level was 6.8  DTRs are 2+ with 1 beat clonus per RN  Patient is alert.   FHR stable, contractions irregular  Dilation: 1 Effacement (%): 50 Cervical Position: Middle Station: -3, -2 Presentation: Vertex Exam by:: Azucena Cecil, RN   Will not repeat Mg level since she still has normal reflexes, i.e. Not hyporeflexive  Will observe for now

## 2015-08-22 NOTE — Progress Notes (Signed)
   Gabriella Griffin is a 25 y.o. G1P0 at [redacted]w[redacted]d  admitted for induction of labor due to Pre-eclamptic toxemia of pregnancy..  Subjective:  Not feeling any contraction, a little crampy Objective: Filed Vitals:   08/22/15 0432 08/22/15 0437 08/22/15 0442 08/22/15 0443  BP: 156/100     Pulse: 87 81 80 76  Temp:      TempSrc:      Resp:      Height:      Weight:      SpO2: 95% 94% 98% 99%   Total I/O In: 1096.8 [I.V.:648.8; Blood:448] Out: [id number]  FHT:  FHR: 140 bpm, variability: moderate,  accelerations:  Present,  decelerations:  Absent UC:   irregular, every 1-4 minutes SVE:   Dilation: Closed Effacement (%): 50 Station: Ballotable Exam by:: M.Merrill, RN Cytotec placed in pos vag fornix  Labs: Lab Results  Component Value Date   WBC 11.3* 08/21/2015   HGB 7.7* 08/21/2015   HCT 22.4* 08/21/2015   MCV 95.7 08/21/2015   PLT 263 08/21/2015    Assessment / Plan: ripening phase IOL; preeclampsia w/severe features--bp better Continue cytotec Continue MgS04 Labor: Progressing normally Fetal Wellbeing:  Category I Pain Control:  Dilaudid Anticipated MOD:  NSVD  CRESENZO-DISHMAN,Shontez Sermon 08/22/2015, 4:58 AM

## 2015-08-23 ENCOUNTER — Inpatient Hospital Stay (HOSPITAL_COMMUNITY): Payer: Medicaid Other | Admitting: Anesthesiology

## 2015-08-23 DIAGNOSIS — Z3A37 37 weeks gestation of pregnancy: Secondary | ICD-10-CM

## 2015-08-23 DIAGNOSIS — O133 Gestational [pregnancy-induced] hypertension without significant proteinuria, third trimester: Secondary | ICD-10-CM

## 2015-08-23 DIAGNOSIS — O99013 Anemia complicating pregnancy, third trimester: Secondary | ICD-10-CM

## 2015-08-23 DIAGNOSIS — D5700 Hb-SS disease with crisis, unspecified: Secondary | ICD-10-CM

## 2015-08-23 LAB — CBC
HEMATOCRIT: 33.5 % — AB (ref 36.0–46.0)
HEMATOCRIT: 32.1 % — AB (ref 36.0–46.0)
HEMOGLOBIN: 11.7 g/dL — AB (ref 12.0–15.0)
HEMOGLOBIN: 11.4 g/dL — AB (ref 12.0–15.0)
MCH: 31.5 pg (ref 26.0–34.0)
MCH: 32.1 pg (ref 26.0–34.0)
MCHC: 34.9 g/dL (ref 30.0–36.0)
MCHC: 35.5 g/dL (ref 30.0–36.0)
MCV: 90.1 fL (ref 78.0–100.0)
MCV: 90.4 fL (ref 78.0–100.0)
Platelets: 256 10*3/uL (ref 150–400)
Platelets: 263 10*3/uL (ref 150–400)
RBC: 3.72 MIL/uL — AB (ref 3.87–5.11)
RBC: 3.55 MIL/uL — AB (ref 3.87–5.11)
RDW: 21.9 % — ABNORMAL HIGH (ref 11.5–15.5)
RDW: 21.5 % — ABNORMAL HIGH (ref 11.5–15.5)
WBC: 11.3 10*3/uL — ABNORMAL HIGH (ref 4.0–10.5)
WBC: 13.5 10*3/uL — ABNORMAL HIGH (ref 4.0–10.5)

## 2015-08-23 LAB — TYPE AND SCREEN
ABO/RH(D): O POS
Antibody Screen: NEGATIVE
DAT, IGG: NEGATIVE
UNIT DIVISION: 0
UNIT DIVISION: 0
Unit division: 0

## 2015-08-23 LAB — COMPREHENSIVE METABOLIC PANEL
ALBUMIN: 2.5 g/dL — AB (ref 3.5–5.0)
ALK PHOS: 216 U/L — AB (ref 38–126)
ALT: 29 U/L (ref 14–54)
ANION GAP: 11 (ref 5–15)
AST: 80 U/L — ABNORMAL HIGH (ref 15–41)
BUN: 6 mg/dL (ref 6–20)
CALCIUM: 7.3 mg/dL — AB (ref 8.9–10.3)
CHLORIDE: 98 mmol/L — AB (ref 101–111)
CO2: 21 mmol/L — AB (ref 22–32)
Creatinine, Ser: 0.65 mg/dL (ref 0.44–1.00)
GFR calc non Af Amer: >60 mL/min (ref >60)
GLUCOSE: 89 mg/dL (ref 65–99)
Potassium: 4.5 mmol/L (ref 3.5–5.1)
SODIUM: 130 mmol/L — AB (ref 135–145)
Total Bilirubin: 4.1 mg/dL — ABNORMAL HIGH (ref 0.3–1.2)
Total Protein: 7 g/dL (ref 6.5–8.1)

## 2015-08-23 LAB — MAGNESIUM: Magnesium: 8.6 mg/dL (ref 1.7–2.4)

## 2015-08-23 MED ORDER — LACTATED RINGERS IV SOLN
INTRAVENOUS | Status: DC
  Administered 2015-08-23 – 2015-08-24 (×2): via INTRAUTERINE

## 2015-08-23 MED ORDER — EPHEDRINE 5 MG/ML INJ: 10.0000 mg | INTRAVENOUS | Status: DC | PRN

## 2015-08-23 MED ORDER — PHENYLEPHRINE 40 MCG/ML (10ML) SYRINGE FOR IV PUSH (FOR BLOOD PRESSURE SUPPORT)
80.0000 ug | PREFILLED_SYRINGE | INTRAVENOUS | Status: DC | PRN
  Administered 2015-08-23: 80 ug via INTRAVENOUS

## 2015-08-23 MED ORDER — LIDOCAINE HCL (PF) 1 % IJ SOLN
INTRAMUSCULAR | Status: DC | PRN
  Administered 2015-08-23: 6 mL via EPIDURAL
  Administered 2015-08-23: 4 mL

## 2015-08-23 MED ORDER — DIPHENHYDRAMINE HCL 50 MG/ML IJ SOLN: 12.5000 mg | INTRAMUSCULAR | Status: DC | PRN

## 2015-08-23 MED ORDER — OXYTOCIN 10 UNIT/ML IJ SOLN
1.0000 m[IU]/min | INTRAVENOUS | Status: DC
  Administered 2015-08-23 – 2015-08-24 (×2): 2 m[IU]/min via INTRAVENOUS
  Administered 2015-08-24: 4 m[IU]/min via INTRAVENOUS
  Administered 2015-08-24: 2 m[IU]/min via INTRAVENOUS
  Administered 2015-08-25: 6 m[IU]/min via INTRAVENOUS

## 2015-08-23 MED ORDER — FENTANYL 2.5 MCG/ML BUPIVACAINE 1/10 % EPIDURAL INFUSION (WH - ANES)
14.0000 mL/h | INTRAMUSCULAR | Status: DC | PRN
  Administered 2015-08-23: 16 mL/h via EPIDURAL
  Administered 2015-08-24: 14 mL/h via EPIDURAL
  Administered 2015-08-24 (×2): 16 mL/h via EPIDURAL
  Administered 2015-08-24: 14 mL/h via EPIDURAL
  Administered 2015-08-24: 16 mL/h via EPIDURAL

## 2015-08-23 MED ORDER — TERBUTALINE SULFATE 1 MG/ML IJ SOLN: 0.2500 mg | Freq: Once | INTRAMUSCULAR | Status: DC | PRN

## 2015-08-23 NOTE — Progress Notes (Signed)
Patient ID: Gabriella Griffin, female   DOB: 1990/08/22, 25 y.o.   MRN: [id number] Gabriella Griffin is a 25 y.o. G1P0 at [redacted]w[redacted]d admitted for induction of labor due to pre-e w/ severe features.  Subjective: Nauseated, had zofran 2 hours ago, has vomited twice since this am. Denies ha, epigastric/ruq pain. Some blurred vision- more on Rt side. Denies sob/cp.   Objective: BP 121/84 mmHg  Pulse 77  Temp(Src) 98.7 F (37.1 C) (Oral)  Resp 16  Ht 5\' 2"  (1.575 m)  Wt 65.545 kg (144 lb 8 oz)  BMI 26.42 kg/m2  SpO2 96%  LMP 12/04/2014 Total I/O In: 1280 [P.O.:780; I.V.:500] Out: 750 [Urine:750]  FHT:  FHR: 125 bpm, variability: min-mod,  accelerations:  Abscent,  decelerations:  Absent UC:   q 2-8mins, some lasting up to 4-38mins  SVE:   Dilation: 4.5 Effacement (%): 80 Station: -2 Exam by:: kim Trevione Wert cnm  BBOW but vtx still high- will wait until it comes down some more for arom  Pitocin @ 4 mu/min  Labs: Lab Results  Component Value Date   WBC 11.3* 08/23/2015   HGB 11.7* 08/23/2015   HCT 33.5* 08/23/2015   MCV 90.1 08/23/2015   PLT 256 08/23/2015    Assessment / Plan: IOL d/t pre-e w/ severe features, on magnesium w/ n/v- will check mag level. Some tetanic uc's, pit at 4cm, cx thinning- will leave pit @ 4 for now, if no change in 2hrs will continue increasing pit  Labor: Progressing normally Fetal Wellbeing:  Category I Pain Control:  dilaudid Pre-eclampsia: stable I/D:  n/a Anticipated MOD:  NSVD  Tawnya Crook CNM, WHNP-BC 08/23/2015, 12:47 PM

## 2015-08-23 NOTE — Progress Notes (Signed)
Patient ID: Gabriella Griffin, female   DOB: 1991-06-06, 25 y.o.   MRN: [id number] Gabriella Griffin is a 25 y.o. G1P0 at [redacted]w[redacted]d admitted for induction of labor due to pre-e w/ severe features. Called by RN- foley bulb fell out  Subjective: Uncomfortable w/ uc's, got dose of dilaudid  Objective: BP 126/83 mmHg  Pulse 73  Temp(Src) 98.7 F (37.1 C) (Oral)  Resp 18  Ht 5\' 2"  ([id number] m)  Wt 65.545 kg (144 lb 8 oz)  BMI 26.42 kg/m2  SpO2 96%  LMP 12/04/2014 Total I/O In: 940 [P.O.:540; I.V.:400] Out: 750 [Urine:750]  FHT:  FHR: 125 bpm, variability: moderate,  accelerations:  Abscent,  decelerations:  Absent UC:   q 2-30mins  SVE:   Dilation: 4.5 Effacement (%): 50 Station: -2 Exam by:: katherine g jones rn Foley bulb now out  Labs: Lab Results  Component Value Date   WBC 11.3* 08/23/2015   HGB 11.7* 08/23/2015   HCT 33.5* 08/23/2015   MCV 90.1 08/23/2015   PLT 256 08/23/2015    Assessment / Plan: IOL d/t pre-e w/ severe features, foley bulb now out, will begin pitocin per protocol  Labor: s/p cervical ripening Fetal Wellbeing:  Category I Pain Control:  dilaudid Pre-eclampsia: stable I/D:  n/a Anticipated MOD:  NSVD  Tawnya Crook CNM, WHNP-BC 08/23/2015, 1100

## 2015-08-23 NOTE — Progress Notes (Signed)
Patient ID: Gabriella Griffin, female   DOB: [birth year], 25 y.o.   MRN: [id number] Gabriella Griffin is a 25 y.o. G1P0 at [redacted]w[redacted]d admitted for induction of labor due to pre-e w/ severe features.  Subjective: Comfortable, no complaints  Objective: BP 126/80 mmHg  Pulse 80  Temp(Src) 99.6 F (37.6 C) (Oral)  Resp 17  Ht 5\' 2"  (1.575 m)  Wt 65.545 kg (144 lb 8 oz)  BMI 26.42 kg/m2  SpO2 100%  LMP 12/04/2014 Total I/O In: 914.8 [P.O.:120; I.V.:794.8] Out: 400 [Urine:400]  FHT:  FHR: 145 bpm, variability: moderate,  accelerations:  Present 10x10s,  decelerations:  Absent UC:   q 2-97mins, mvu's inadequate, iupc in place  SVE:   Dilation: 5 Effacement (%): 70 Station: -2 Exam by:: Doree Fudge, CNM  caput  Pitocin @ 6 mu/min  Labs: Lab Results  Component Value Date   WBC 13.5* 08/23/2015   HGB 11.4* 08/23/2015   HCT 32.1* 08/23/2015   MCV 90.4 08/23/2015   PLT 263 08/23/2015    Assessment / Plan: IOL d/t pre-e w/ severe features, mvu's inadequate, vtx w/ caput- discussed w/ Dr. Kennon Rounds- will continue to increase pitocin per protocol to attempt to achieve adequate mvu's  Labor: early Fetal Wellbeing:  Category I Pain Control:  Epidural Pre-eclampsia: stable I/D:  n/a Anticipated MOD:  NSVD  Tawnya Crook CNM, WHNP-BC 08/23/2015, 10:51 PM

## 2015-08-23 NOTE — Progress Notes (Signed)
Gabriella Griffin is a 25 y.o. G1P0 at [redacted]w[redacted]d admitted for induction of labor due to Pre-eclamptic toxemia of pregnancy..  Subjective: Has rec'd cytotec x 7 doses; getting Dilaudid prn pain; pt with visual disturbances- unchanged  Objective: BP 152/94 mmHg  Pulse 78  Temp(Src) 98.7 F (37.1 C) (Oral)  Resp 18  Ht 5\' 2"  (1.575 m)  Wt 65.545 kg (144 lb 8 oz)  BMI 26.42 kg/m2  SpO2 95%  LMP 12/04/2014 I/O last 3 completed shifts: In: 6950.8 [P.O.:3030; I.V.:3153.8; Blood:767] Out: 8550 [Urine:7800; Emesis/NG output:750]    FHT:  FHR: 140s bpm, variability: moderate,  accelerations:  Present,  decelerations:  Absent UC:   irreg SVE:   Dilation: 1 Effacement (%): 70 Station: -3, -2 Exam by:: Gabriella Griffin, CNM - foley bulb inserted without difficulty  Labs: Lab Results  Component Value Date   WBC 14.0* 08/22/2015   HGB 11.0* 08/22/2015   HCT 31.7* 08/22/2015   MCV 91.6 08/22/2015   PLT 270 08/22/2015    Assessment / Plan: IUP@ term Pre-e w/ severe features SSD IOL process  Will leave foley in place until it comes out Pleased w/ I&O; BPs okay   Gabriella Griffin, KIMBERLYCNM 08/23/2015, 8:01 AM

## 2015-08-23 NOTE — Progress Notes (Signed)
Patient ID: Gabriella Griffin, female   DOB: 07-Sep-1990, 25 y.o.   MRN: [id number] Gabriella Griffin is a 25 y.o. G1P0 at [redacted]w[redacted]d admitted for induction of labor due to pre-e w/ severe features.  Subjective: Feeling much better now that magnesium down to 1gm/hr (mag level earlier was 8.6) Getting much more uncomfortable w/ uc's- requesting epidural  Objective: BP 139/90 mmHg  Pulse 73  Temp(Src) 98.4 F (36.9 C) (Oral)  Resp 18  Ht 5\' 2"  (1.575 m)  Wt 65.545 kg (144 lb 8 oz)  BMI 26.42 kg/m2  SpO2 96%  LMP 12/04/2014 Total I/O In: 1950.5 [P.O.:1020; I.V.:930.5] Out: 1700 [Urine:1700]  FHT:  FHR: 130 bpm, variability: min-mod,  accelerations:  Abscent,  decelerations:  Present occ variables UC:   regular, every 2-3 minutes  SVE:   4.5/80/-2 by RN  Pitocin @ 14 mu/min  Labs: Lab Results  Component Value Date   WBC PENDING 08/23/2015   HGB 11.4* 08/23/2015   HCT 32.1* 08/23/2015   MCV 90.4 08/23/2015   PLT PENDING 08/23/2015    Assessment / Plan: IOL d/t pre-e w/ severe features, getting more uncomfortable w/ uc's- requesting epidural- will recheck cx after epidural- mag level was 8.6 on 2gm/hr w/ n/v so mag turned down to 1gm/hr and feeling much better, bp's stable  Labor: early Fetal Wellbeing:  Category II Pain Control:  dilaudid- requesting epidural Pre-eclampsia: stable I/D:  n/a Anticipated MOD:  NSVD  Tawnya Crook CNM, WHNP-BC 08/23/2015, 5:16 PM

## 2015-08-23 NOTE — Anesthesia Procedure Notes (Signed)

## 2015-08-23 NOTE — Progress Notes (Signed)
Patient ID: Gabriella Griffin, female   DOB: [birth year], 25 y.o.   MRN: [id number] Gabriella Griffin is a 25 y.o. G1P0 at [redacted]w[redacted]d admitted for induction of labor due to pre-e w/ severe features.  Subjective: Just got epidural, comfortable  Objective: BP 136/88 mmHg  Pulse 75  Temp(Src) 98.4 F (36.9 C) (Oral)  Resp 18  Ht 5\' 2"  (1.575 m)  Wt 65.545 kg (144 lb 8 oz)  BMI 26.42 kg/m2  SpO2 98%  LMP 12/04/2014    FHT:  FHR: 135 bpm, variability: moderate,  accelerations:  Present,  decelerations:  Absent UC:   Irregular  In at 1830 to check pt after epidural- had been having some variables w/ uc's and RN had just turned to Rt side- turned pt to back for urine foley catheter placement and SVE- prolonged decel w/ nadir in 70s- sve still 4-5cm, turned back to Rt side and pitocin d/c'd to allow fhr to recover- dr. Kennon Rounds called for eval- SVE 4-5 vtx still high- arom'd mod amt thick mec FSE and IUPC placed by her  SVE:   Dilation: 4.5 Effacement (%): 80 Station: -2 Exam by:: Kennon Rounds MD  Labs: Lab Results  Component Value Date   WBC 13.5* 08/23/2015   HGB 11.4* 08/23/2015   HCT 32.1* 08/23/2015   MCV 90.4 08/23/2015   PLT 263 08/23/2015    Assessment / Plan: IOL d/t pre-e w/ severe features, pitocin had gotten up to 69mu/min but had to be turned off d/t prolonged d/c, now arom'd w/ mod amt thick mec w/ FSE and IUPC, fhr has now been reassuring for 1hr, will resume pitocin per protocol  Labor: early Fetal Wellbeing:  Category I at present time Pain Control:  Epidural Pre-eclampsia: stable I/D:  n/a Anticipated MOD:  NSVD  Tawnya Crook CNM, WHNP-BC 08/23/2015, 7:36 PM

## 2015-08-23 NOTE — Anesthesia Preprocedure Evaluation (Signed)
Anesthesia Evaluation  Patient identified by MRN, date of birth, ID band Patient awake    Reviewed: Allergy & Precautions, H&P , Patient's Chart, lab work & pertinent test results  Airway Mallampati: II  TM Distance: >3 FB Neck ROM: full    Dental  (+) Teeth Intact   Pulmonary    breath sounds clear to auscultation       Cardiovascular hypertension, On Medications  Rhythm:regular Rate:Normal     Neuro/Psych    GI/Hepatic   Endo/Other    Renal/GU      Musculoskeletal   Abdominal   Peds  Hematology  (+) Sickle cell trait ,   Anesthesia Other Findings       Reproductive/Obstetrics (+) Pregnancy                             Anesthesia Physical Anesthesia Plan  ASA: II  Anesthesia Plan: Epidural   Post-op Pain Management:    Induction:   Airway Management Planned:   Additional Equipment:   Intra-op Plan:   Post-operative Plan:   Informed Consent: I have reviewed the patients History and Physical, chart, labs and discussed the procedure including the risks, benefits and alternatives for the proposed anesthesia with the patient or authorized representative who has indicated his/her understanding and acceptance.   Dental Advisory Given  Plan Discussed with:   Anesthesia Plan Comments: (Labs checked- platelets confirmed with RN in room. Fetal heart tracing, per RN, reported to be stable enough for sitting procedure. Discussed epidural, and patient consents to the procedure:  included risk of possible headache,backache, failed block, allergic reaction, and nerve injury. This patient was asked if she had any questions or concerns before the procedure started.)        Anesthesia Quick Evaluation

## 2015-08-23 NOTE — Progress Notes (Signed)
Patient ID: Gabriella Griffin, female   DOB: 26-Oct-1990, 25 y.o.   MRN: [id number] Gabriella Griffin is a 25 y.o. G1P0 at [redacted]w[redacted]d admitted for induction of labor due to pre-e w/ severe features.  Subjective: Comfortable, no complaints  Objective: BP 117/78 mmHg  Pulse 77  Temp(Src) 98.5 F (36.9 C) (Oral)  Resp 17  Ht 5\' 2"  (1.575 m)  Wt 65.545 kg (144 lb 8 oz)  BMI 26.42 kg/m2  SpO2 95%  LMP 12/04/2014    FHT:  FHR: 150 bpm, variability: min-mod,  accelerations:  Present,  decelerations:  Absent; also reviewed by Dr. Veronia Beets rising UC:   q 2-55mins  SVE:   Dilation: 5 Effacement (%): 70 Station: -2 Exam by:: Knute Neu, CNM  Thick mec  Pitocin @ 4 mu/min  Labs: Lab Results  Component Value Date   WBC 13.5* 08/23/2015   HGB 11.4* 08/23/2015   HCT 32.1* 08/23/2015   MCV 90.4 08/23/2015   PLT 263 08/23/2015    Assessment / Plan: IOL d/t pre-e w/ severe features, bp lower than her baseline- will give 58ml LR IV bolus, baseline rising/afebrile- will also start amnioinfusion as well; continue to monitor closely  Labor: early Fetal Wellbeing:  Category II Pain Control:  Epidural Pre-eclampsia: stable I/D:  n/a Anticipated MOD:  NSVD  Tawnya Crook CNM, WHNP-BC 08/23/2015, 8:42 PM

## 2015-08-24 LAB — COMPREHENSIVE METABOLIC PANEL
ALBUMIN: 2 g/dL — AB (ref 3.5–5.0)
ALT: 19 U/L (ref 14–54)
ALT: 17 U/L (ref 14–54)
ANION GAP: 12 (ref 5–15)
ANION GAP: 14 (ref 5–15)
AST: 57 U/L — ABNORMAL HIGH (ref 15–41)
AST: 57 U/L — ABNORMAL HIGH (ref 15–41)
Albumin: 1.9 g/dL — ABNORMAL LOW (ref 3.5–5.0)
Alkaline Phosphatase: 158 U/L — ABNORMAL HIGH (ref 38–126)
Alkaline Phosphatase: 173 U/L — ABNORMAL HIGH (ref 38–126)
BILIRUBIN TOTAL: 3.1 mg/dL — AB (ref 0.3–1.2)
BUN: 8 mg/dL (ref 6–20)
BUN: 10 mg/dL (ref 6–20)
CALCIUM: 6.5 mg/dL — AB (ref 8.9–10.3)
CO2: 18 mmol/L — ABNORMAL LOW (ref 22–32)
CO2: 16 mmol/L — AB (ref 22–32)
Calcium: 6.6 mg/dL — ABNORMAL LOW (ref 8.9–10.3)
Chloride: 102 mmol/L (ref 101–111)
Chloride: 100 mmol/L — ABNORMAL LOW (ref 101–111)
Creatinine, Ser: 0.86 mg/dL (ref 0.44–1.00)
Creatinine, Ser: 1.06 mg/dL — ABNORMAL HIGH (ref 0.44–1.00)
GFR calc Af Amer: >60 mL/min (ref >60)
GFR calc non Af Amer: >60 mL/min (ref >60)
GLUCOSE: 77 mg/dL (ref 65–99)
Glucose, Bld: 75 mg/dL (ref 65–99)
POTASSIUM: 4.4 mmol/L (ref 3.5–5.1)
Potassium: 4.6 mmol/L (ref 3.5–5.1)
SODIUM: 132 mmol/L — AB (ref 135–145)
SODIUM: 130 mmol/L — AB (ref 135–145)
TOTAL PROTEIN: 5.7 g/dL — AB (ref 6.5–8.1)
TOTAL PROTEIN: 5.6 g/dL — AB (ref 6.5–8.1)
Total Bilirubin: 3.7 mg/dL — ABNORMAL HIGH (ref 0.3–1.2)

## 2015-08-24 LAB — CBC
HEMATOCRIT: 30.6 % — AB (ref 36.0–46.0)
HEMOGLOBIN: 10.5 g/dL — AB (ref 12.0–15.0)
MCH: 31.3 pg (ref 26.0–34.0)
MCHC: 34.3 g/dL (ref 30.0–36.0)
MCV: 91.3 fL (ref 78.0–100.0)
Platelets: 140 10*3/uL — ABNORMAL LOW (ref 150–400)
RBC: 3.35 MIL/uL — ABNORMAL LOW (ref 3.87–5.11)
RDW: 20.6 % — ABNORMAL HIGH (ref 11.5–15.5)
WBC: 16.9 10*3/uL — ABNORMAL HIGH (ref 4.0–10.5)

## 2015-08-24 LAB — MAGNESIUM: MAGNESIUM: 8.2 mg/dL — AB (ref 1.7–2.4)

## 2015-08-24 MED ORDER — HYDRALAZINE HCL 20 MG/ML IJ SOLN
10.0000 mg | Freq: Once | INTRAMUSCULAR | Status: AC
  Administered 2015-08-24: 10 mg via INTRAVENOUS

## 2015-08-24 MED ORDER — BUPIVACAINE HCL (PF) 0.25 % IJ SOLN
INTRAMUSCULAR | Status: DC | PRN
  Administered 2015-08-24 (×2): 4 mL via EPIDURAL
  Administered 2015-08-24 (×4): 5 mL via EPIDURAL

## 2015-08-24 NOTE — Progress Notes (Signed)
Pt awakened to assess pain scale. Replaced epidural bag of medicine.

## 2015-08-24 NOTE — Progress Notes (Signed)
Patient ID: Gabriella Griffin, female   DOB: 11-22-1990, 25 y.o.   MRN: [id number] S: pt very uncomfortable anesthesia in to see pt O: VSS, SVE 4/80/-2to-3 much caput noted and vaginal swelling. New IUPC placed A: pre eclampsia, sickle cell, slow progress, poor labor pattern P: restart pit, watch closly. Progress report to Dr. Elly Modena

## 2015-08-24 NOTE — Progress Notes (Signed)
Patient ID: Snow Shuff, female   DOB: Aug 23, 1990, 25 y.o.   MRN: [id number] Kyung Moehring is a 25 y.o. G1P0 at [redacted]w[redacted]d admitted for induction of labor due to pre-e w/ severe features.  Subjective: Comfortable, no complaints  Objective: BP 136/84 mmHg  Pulse 72  Temp(Src) 99.1 F (37.3 C) (Oral)  Resp 16  Ht 5\' 2"  (1.575 m)  Wt 65.545 kg (144 lb 8 oz)  BMI 26.42 kg/m2  SpO2 100%  LMP 12/04/2014 Total I/O In: 2425 [P.O.:560; I.V.:1865] Out: 925 [Urine:925]  FHT:  FHR: 140 bpm, variability: min-mod,  accelerations:  Present 10x10s,  decelerations:  Present occ variables/prolongeds (nadir 10-15bpm below baseline) when has mutliple uc's in a row UC:   Irregular q 2-7, mvu's inadequate  SVE:  5/70/-2 @ 2352 Deferred at this time as mvu's inadequate  Pitocin @ 12 mu/min  Labs: Lab Results  Component Value Date   WBC 13.5* 08/23/2015   HGB 11.4* 08/23/2015   HCT 32.1* 08/23/2015   MCV 90.4 08/23/2015   PLT 263 08/23/2015    Assessment / Plan: IOL d/t pre-e w/ severe features, pit @ 42mu/min- RN increasing slowly d/t occ decels, overall reassuring fhr- discussed w/ Dr. Marlene Bast review strip- continue increasing pitocin per protocol as needed to acheive adequate mvu's-  Labor: early Fetal Wellbeing:  Category II Pain Control:  Epidural Pre-eclampsia: stable I/D:  n/a Anticipated MOD:  NSVD  Tawnya Crook CNM, WHNP-BC 08/24/2015, 0230

## 2015-08-24 NOTE — Progress Notes (Signed)
Gabriella Griffin is a 25 y.o. G1P0 at [redacted]w[redacted]d by ultrasound admitted for induction of labor due to Pre-eclamptic toxemia of pregnancy..  Subjective:   Objective: BP 131/72 mmHg  Pulse 69  Temp(Src) 98.1 F (36.7 C) (Axillary)  Resp 18  Ht 5\' 2"  (1.575 m)  Wt 144 lb 8 oz (65.545 kg)  BMI 26.42 kg/m2  SpO2 100%  LMP 12/04/2014 I/O last 3 completed shifts: In: 8335.2 [P.O.:3600; I.V.:4735.2] Out: [id number] [Urine:6325; Emesis/NG output:650] Total I/O In: 39.5 [I.V.:39.5] Out: 50 [Urine:50]  FHT:  FHR: 140's bpm, variability: moderate,  accelerations:  Present,  decelerations:  Absent UC:   none SVE:   Dilation: 5.5 Effacement (%): 80 Station: -1 Exam by:: kim booker cnm  Labs: Lab Results  Component Value Date   WBC 13.5* 08/23/2015   HGB 11.4* 08/23/2015   HCT 32.1* 08/23/2015   MCV 90.4 08/23/2015   PLT 263 08/23/2015    Assessment / Plan: Induction of labor due to preeclampsia,  progressing well on pitocin  Labor: yet to be in labor Preeclampsia:  on magnesium sulfate, intake and ouput balanced and labs stable Fetal Wellbeing:  Category I Pain Control:  Epidural I/D:  n/a Anticipated MOD:  NSVD  LAWSON, MARIE DARLENE 08/24/2015, 9:09 AM

## 2015-08-24 NOTE — Progress Notes (Signed)
Spoke with Rockwell Alexandria CNM regarding firm abd upon palpation and IUPC not tracing contractions. SVE 5/80/-1, vaginal wall swollen. Pt c/o constant pain in abd and hips. Order received to stop amnioinfusion, allow to drain fluid via IUPC, then pull, place external toco. Will re-evlauate.

## 2015-08-24 NOTE — Progress Notes (Signed)
Patient ID: Taleya Mcdonald, female   DOB: Nov 03, 1990, 25 y.o.   MRN: [id number] Amyrie Feinstein is a 25 y.o. G1P0 at [redacted]w[redacted]d admitted for induction of labor due to pre-e w/ severe features.  Subjective: Comfortable, no complaints  Objective: BP 131/72 mmHg  Pulse 69  Temp(Src) 98.1 F (36.7 C) (Axillary)  Resp 18  Ht 5\' 2"  (1.575 m)  Wt 65.545 kg (144 lb 8 oz)  BMI 26.42 kg/m2  SpO2 100%  LMP 12/04/2014 Total I/O In: 39.5 [I.V.:39.5] Out: 50 [Urine:50]  FHT:  FHR: 130 bpm, variability: moderate,  accelerations:  Present 10x10s,  decelerations:  Absent UC:   q 2-72mins  SVE:   Dilation: 5.5 Effacement (%): 80 Station: -1 Exam by:: kim Lynda Wanninger cnm caput @ -1, vtx @ -2  Pitocin @ 20 mu/min  Labs: Lab Results  Component Value Date   WBC 13.5* 08/23/2015   HGB 11.4* 08/23/2015   HCT 32.1* 08/23/2015   MCV 90.4 08/23/2015   PLT 263 08/23/2015    Assessment / Plan: IOL d/t pre-e w/ severe features, pit now @ 43mu, caput at -1 vtx @ -2, making some descent. Discussed w/ oncoming team, will give pit break for now  Labor: early Fetal Wellbeing:  Category I Pain Control:  Epidural Pre-eclampsia: stable I/D:  n/a Anticipated MOD:  NSVD  Tawnya Crook CNM, WHNP-BC 08/24/2015, 8:18 AM

## 2015-08-24 NOTE — Progress Notes (Signed)
Gabriella Griffin is a 25 y.o. G1P0 at [redacted]w[redacted]d by ultrasound admitted for induction of labor due to Pre-eclamptic toxemia of pregnancy..  Subjective:   Objective: BP 140/83 mmHg  Pulse 75  Temp(Src) 98.4 F (36.9 C) (Axillary)  Resp 18  Ht 5\' 2"  (1.575 m)  Wt 144 lb 8 oz (65.545 kg)  BMI 26.42 kg/m2  SpO2 98%  LMP 12/04/2014 I/O last 3 completed shifts: In: 8335.2 [P.O.:3600; I.V.:4735.2] Out: [id number] [Urine:6325; Emesis/NG output:650] Total I/O In: 2025.9 [P.O.:1110; I.V.:915.9] Out: 1050 [Urine:1050]  FHT:  FHR: 120 bpm, variability: moderate,  accelerations:  Abscent,  decelerations:  Absent UC:   regular, every 2-3 minutes SVE:   Dilation: Lip/rim Effacement (%): 100 Station: +1, +2 Exam by:: katherine g jones rn  Labs: Lab Results  Component Value Date   WBC 16.9* 08/24/2015   HGB 10.5* 08/24/2015   HCT 30.6* 08/24/2015   MCV 91.3 08/24/2015   PLT 140* 08/24/2015    Assessment / Plan: Induction of labor due to preeclampsia,  progressing well on pitocin  Labor: Progressing normally Preeclampsia:  on magnesium sulfate and intake and ouput balanced Fetal Wellbeing:  Category II Pain Control:  Epidural I/D:  n/a Anticipated MOD:  NSVD  Holliday Sheaffer DARLENE 08/24/2015, 6:41 PM

## 2015-08-24 NOTE — Progress Notes (Signed)
Gabriella Griffin is a 25 y.o. G1P0 at [redacted]w[redacted]d by ultrasound admitted for induction of labor due to Pre-eclamptic toxemia of pregnancy..  Subjective:   Objective: BP 146/105 mmHg  Pulse 75  Temp(Src) 98.7 F (37.1 C) (Axillary)  Resp 18  Ht 5\' 2"  (1.575 m)  Wt 144 lb 8 oz (65.545 kg)  BMI 26.42 kg/m2  SpO2 98%  LMP 12/04/2014 I/O last 3 completed shifts: In: 8335.2 [P.O.:3600; I.V.:4735.2] Out: [id number] [Urine:6325; Emesis/NG output:650] Total I/O In: [id number] [P.O.:870; I.V.:620] Out: 750 [Urine:750]  FHT:  FHR: 120's bpm, variability: moderate,  accelerations:  Present,  decelerations:  Absent UC:   regular, every 2-5 mild minutes SVE:   Dilation: 6 Effacement (%): 90 Station: 0 Exam by:: katherine g jones rn   Labs: Lab Results  Component Value Date   WBC 16.9* 08/24/2015   HGB 10.5* 08/24/2015   HCT 30.6* 08/24/2015   MCV 91.3 08/24/2015   PLT 140* 08/24/2015    Assessment / Plan: Induction of labor due to preeclampsia,  progressing well on pitocin  Labor: slow progress Preeclampsia:  on magnesium sulfate and intake and ouput balanced Fetal Wellbeing:  Category I Pain Control:  Epidural I/D:  n/a Anticipated MOD:  NSVD  Gabriella Griffin DARLENE 08/24/2015, 3:18 PM

## 2015-08-24 NOTE — Progress Notes (Signed)
Multiple position changes with bed change, but most comfortable on right lateral.

## 2015-08-24 NOTE — Progress Notes (Signed)
Attempting to turn patient to left side, but very uncomfortable on her right side, so back to right side.

## 2015-08-24 NOTE — Progress Notes (Signed)
Bed linen/gown changed due to wetness.

## 2015-08-25 ENCOUNTER — Encounter (HOSPITAL_COMMUNITY): Payer: Self-pay | Admitting: *Deleted

## 2015-08-25 ENCOUNTER — Encounter (HOSPITAL_COMMUNITY)
Admission: AD | Disposition: A | Discharge status: Home / Self Care | Payer: Self-pay | Source: Other Acute Inpatient Hospital | Attending: Family Medicine

## 2015-08-25 DIAGNOSIS — O9902 Anemia complicating childbirth: Secondary | ICD-10-CM

## 2015-08-25 DIAGNOSIS — O134 Gestational [pregnancy-induced] hypertension without significant proteinuria, complicating childbirth: Secondary | ICD-10-CM

## 2015-08-25 DIAGNOSIS — Z3A37 37 weeks gestation of pregnancy: Secondary | ICD-10-CM

## 2015-08-25 DIAGNOSIS — D5700 Hb-SS disease with crisis, unspecified: Secondary | ICD-10-CM

## 2015-08-25 LAB — PREPARE RBC (CROSSMATCH)

## 2015-08-25 LAB — CBC
HCT: 27 % — ABNORMAL LOW (ref 36.0–46.0)
Hemoglobin: 9.3 g/dL — ABNORMAL LOW (ref 12.0–15.0)
MCH: 31.2 pg (ref 26.0–34.0)
MCHC: 34.4 g/dL (ref 30.0–36.0)
MCV: 90.6 fL (ref 78.0–100.0)
PLATELETS: 128 10*3/uL — AB (ref 150–400)
RBC: 2.98 MIL/uL — ABNORMAL LOW (ref 3.87–5.11)
RDW: 19.3 % — ABNORMAL HIGH (ref 11.5–15.5)
WBC: 17.8 10*3/uL — ABNORMAL HIGH (ref 4.0–10.5)

## 2015-08-25 SURGERY — Surgical Case
Anesthesia: Epidural | Site: Abdomen

## 2015-08-25 MED ORDER — FUROSEMIDE 10 MG/ML IJ SOLN
20.0000 mg | Freq: Once | INTRAMUSCULAR | Status: AC
  Administered 2015-08-25: 20 mg via INTRAVENOUS
  Filled 2015-08-25: qty 2

## 2015-08-25 MED ORDER — MAGNESIUM SULFATE 50 % IJ SOLN
1.0000 g/h | INTRAVENOUS | Status: AC
  Filled 2015-08-25: qty 80

## 2015-08-25 MED ORDER — KETAMINE HCL 10 MG/ML IJ SOLN
INTRAMUSCULAR | Status: DC | PRN
  Administered 2015-08-25: 20 mg via INTRAVENOUS

## 2015-08-25 MED ORDER — SIMETHICONE 80 MG PO CHEW
80.0000 mg | CHEWABLE_TABLET | Freq: Three times a day (TID) | ORAL | Status: DC
  Administered 2015-08-25 – 2015-08-29 (×8): 80 mg via ORAL
  Filled 2015-08-25 (×8): qty 1

## 2015-08-25 MED ORDER — SODIUM CHLORIDE 0.9 % IV SOLN: Freq: Once | INTRAVENOUS | Status: DC

## 2015-08-25 MED ORDER — SODIUM CHLORIDE 0.9 % IJ SOLN: 3.0000 mL | INTRAMUSCULAR | Status: DC | PRN

## 2015-08-25 MED ORDER — PHENYLEPHRINE 8 MG IN D5W 100 ML (0.08MG/ML) PREMIX OPTIME
INJECTION | INTRAVENOUS | Status: AC
  Filled 2015-08-25: qty 100

## 2015-08-25 MED ORDER — MORPHINE SULFATE (PF) 0.5 MG/ML IJ SOLN
INTRAMUSCULAR | Status: DC | PRN
  Administered 2015-08-25: 4 mg via EPIDURAL

## 2015-08-25 MED ORDER — LACTATED RINGERS IV SOLN: INTRAVENOUS | Status: DC

## 2015-08-25 MED ORDER — PHENYLEPHRINE HCL 10 MG/ML IJ SOLN
INTRAMUSCULAR | Status: DC | PRN
  Administered 2015-08-25 (×2): 80 ug via INTRAVENOUS

## 2015-08-25 MED ORDER — LACTATED RINGERS IV SOLN
INTRAVENOUS | Status: DC | PRN
  Administered 2015-08-25: 01:00:00 via INTRAVENOUS

## 2015-08-25 MED ORDER — SCOPOLAMINE 1 MG/3DAYS TD PT72: 1.0000 | MEDICATED_PATCH | Freq: Once | TRANSDERMAL | Status: DC

## 2015-08-25 MED ORDER — ACETAMINOPHEN 325 MG PO TABS
650.0000 mg | ORAL_TABLET | ORAL | Status: DC | PRN
  Administered 2015-08-25 – 2015-08-27 (×2): 650 mg via ORAL
  Filled 2015-08-25 (×2): qty 2

## 2015-08-25 MED ORDER — NALBUPHINE HCL 10 MG/ML IJ SOLN: 5.0000 mg | INTRAMUSCULAR | Status: DC | PRN

## 2015-08-25 MED ORDER — OXYTOCIN 10 UNIT/ML IJ SOLN
INTRAMUSCULAR | Status: AC
  Filled 2015-08-25: qty 4

## 2015-08-25 MED ORDER — LANOLIN HYDROUS EX OINT: 1.0000 "application " | TOPICAL_OINTMENT | CUTANEOUS | Status: DC | PRN

## 2015-08-25 MED ORDER — SENNOSIDES-DOCUSATE SODIUM 8.6-50 MG PO TABS
2.0000 | ORAL_TABLET | ORAL | Status: DC
  Administered 2015-08-25 – 2015-08-26 (×2): 2 via ORAL
  Filled 2015-08-25 (×3): qty 2

## 2015-08-25 MED ORDER — HYDROMORPHONE HCL 1 MG/ML IJ SOLN: 0.5000 mg | INTRAMUSCULAR | Status: DC | PRN

## 2015-08-25 MED ORDER — MIDAZOLAM HCL 2 MG/2ML IJ SOLN
INTRAMUSCULAR | Status: AC
  Filled 2015-08-25: qty 2

## 2015-08-25 MED ORDER — SODIUM BICARBONATE 8.4 % IV SOLN
INTRAVENOUS | Status: DC | PRN
  Administered 2015-08-25 (×4): 5 mL via EPIDURAL

## 2015-08-25 MED ORDER — SIMETHICONE 80 MG PO CHEW: 80.0000 mg | CHEWABLE_TABLET | ORAL | Status: DC | PRN

## 2015-08-25 MED ORDER — ONDANSETRON HCL 4 MG/2ML IJ SOLN: 4.0000 mg | Freq: Three times a day (TID) | INTRAMUSCULAR | Status: DC | PRN

## 2015-08-25 MED ORDER — PROMETHAZINE HCL 25 MG/ML IJ SOLN: 6.2500 mg | INTRAMUSCULAR | Status: DC | PRN

## 2015-08-25 MED ORDER — OXYTOCIN 10 UNIT/ML IJ SOLN
2.5000 [IU]/h | INTRAVENOUS | Status: AC
  Filled 2015-08-25: qty 4

## 2015-08-25 MED ORDER — NALBUPHINE HCL 10 MG/ML IJ SOLN: 5.0000 mg | Freq: Once | INTRAMUSCULAR | Status: DC | PRN

## 2015-08-25 MED ORDER — LACTATED RINGERS IV SOLN
40.0000 [IU] | INTRAVENOUS | Status: DC | PRN
  Administered 2015-08-25: 40 [IU] via INTRAVENOUS

## 2015-08-25 MED ORDER — FENTANYL CITRATE (PF) 100 MCG/2ML IJ SOLN: 25.0000 ug | INTRAMUSCULAR | Status: DC | PRN

## 2015-08-25 MED ORDER — FENTANYL CITRATE (PF) 100 MCG/2ML IJ SOLN
INTRAMUSCULAR | Status: DC | PRN
  Administered 2015-08-25: 100 ug via EPIDURAL

## 2015-08-25 MED ORDER — ONDANSETRON HCL 4 MG/2ML IJ SOLN
INTRAMUSCULAR | Status: AC
  Filled 2015-08-25: qty 2

## 2015-08-25 MED ORDER — FENTANYL CITRATE (PF) 100 MCG/2ML IJ SOLN
INTRAMUSCULAR | Status: AC
  Filled 2015-08-25: qty 2

## 2015-08-25 MED ORDER — DIPHENHYDRAMINE HCL 25 MG PO CAPS
25.0000 mg | ORAL_CAPSULE | ORAL | Status: DC | PRN
  Filled 2015-08-25 (×2): qty 1

## 2015-08-25 MED ORDER — DIBUCAINE 1 % RE OINT
1.0000 "application " | TOPICAL_OINTMENT | RECTAL | Status: DC | PRN
  Filled 2015-08-25: qty 28

## 2015-08-25 MED ORDER — ACETAMINOPHEN 500 MG PO TABS
1000.0000 mg | ORAL_TABLET | Freq: Four times a day (QID) | ORAL | Status: AC
  Administered 2015-08-25: 1000 mg via ORAL
  Filled 2015-08-25: qty 2

## 2015-08-25 MED ORDER — NALOXONE HCL 0.4 MG/ML IJ SOLN: 0.4000 mg | INTRAMUSCULAR | Status: DC | PRN

## 2015-08-25 MED ORDER — SIMETHICONE 80 MG PO CHEW
80.0000 mg | CHEWABLE_TABLET | ORAL | Status: DC
  Administered 2015-08-26 – 2015-08-28 (×4): 80 mg via ORAL
  Filled 2015-08-25 (×4): qty 1

## 2015-08-25 MED ORDER — DIPHENHYDRAMINE HCL 25 MG PO CAPS: 25.0000 mg | ORAL_CAPSULE | Freq: Four times a day (QID) | ORAL | Status: DC | PRN

## 2015-08-25 MED ORDER — MEPERIDINE HCL 25 MG/ML IJ SOLN: 6.2500 mg | INTRAMUSCULAR | Status: DC | PRN

## 2015-08-25 MED ORDER — MIDAZOLAM HCL 2 MG/2ML IJ SOLN
INTRAMUSCULAR | Status: DC | PRN
  Administered 2015-08-25: 1 mg via INTRAVENOUS
  Administered 2015-08-25: 2 mg via INTRAVENOUS

## 2015-08-25 MED ORDER — TETANUS-DIPHTH-ACELL PERTUSSIS 5-2.5-18.5 LF-MCG/0.5 IM SUSP
0.5000 mL | Freq: Once | INTRAMUSCULAR | Status: DC
  Filled 2015-08-25: qty 0.5

## 2015-08-25 MED ORDER — MORPHINE SULFATE (PF) 0.5 MG/ML IJ SOLN
INTRAMUSCULAR | Status: AC
  Filled 2015-08-25: qty 10

## 2015-08-25 MED ORDER — VITAMIN K1 1 MG/0.5ML IJ SOLN
INTRAMUSCULAR | Status: AC
  Filled 2015-08-25: qty 0.5

## 2015-08-25 MED ORDER — PRENATAL MULTIVITAMIN CH
1.0000 | ORAL_TABLET | Freq: Every day | ORAL | Status: DC
  Administered 2015-08-26 – 2015-08-28 (×2): 1 via ORAL
  Filled 2015-08-25 (×3): qty 1

## 2015-08-25 MED ORDER — GENTAMICIN SULFATE 40 MG/ML IJ SOLN
INTRAVENOUS | Status: AC
  Administered 2015-08-25: 113 mL via INTRAVENOUS
  Filled 2015-08-25: qty 7

## 2015-08-25 MED ORDER — SODIUM CHLORIDE 0.9 % IR SOLN
Status: DC | PRN
  Administered 2015-08-25: 1000 mL

## 2015-08-25 MED ORDER — ONDANSETRON HCL 4 MG/2ML IJ SOLN
INTRAMUSCULAR | Status: DC | PRN
  Administered 2015-08-25: 4 mg via INTRAVENOUS

## 2015-08-25 MED ORDER — MENTHOL 3 MG MT LOZG
1.0000 | LOZENGE | OROMUCOSAL | Status: DC | PRN
  Filled 2015-08-25: qty 9

## 2015-08-25 MED ORDER — OXYCODONE HCL 5 MG PO TABS: 5.0000 mg | ORAL_TABLET | ORAL | Status: AC | PRN

## 2015-08-25 MED ORDER — NALOXONE HCL 2 MG/2ML IJ SOSY
1.0000 ug/kg/h | PREFILLED_SYRINGE | INTRAVENOUS | Status: DC | PRN
  Filled 2015-08-25: qty 2

## 2015-08-25 MED ORDER — SCOPOLAMINE 1 MG/3DAYS TD PT72
MEDICATED_PATCH | TRANSDERMAL | Status: DC | PRN
  Administered 2015-08-25: 1 via TRANSDERMAL

## 2015-08-25 MED ORDER — IBUPROFEN 600 MG PO TABS
600.0000 mg | ORAL_TABLET | Freq: Four times a day (QID) | ORAL | Status: DC
  Administered 2015-08-25 – 2015-08-29 (×15): 600 mg via ORAL
  Filled 2015-08-25 (×16): qty 1

## 2015-08-25 MED ORDER — DIPHENHYDRAMINE HCL 50 MG/ML IJ SOLN: 12.5000 mg | INTRAMUSCULAR | Status: DC | PRN

## 2015-08-25 MED ORDER — WITCH HAZEL-GLYCERIN EX PADS: 1.0000 "application " | MEDICATED_PAD | CUTANEOUS | Status: DC | PRN

## 2015-08-25 MED ORDER — KETAMINE HCL 10 MG/ML IJ SOLN
INTRAMUSCULAR | Status: AC
  Filled 2015-08-25: qty 1

## 2015-08-25 SURGICAL SUPPLY — 31 items
BENZOIN TINCTURE PRP APPL 2/3 (GAUZE/BANDAGES/DRESSINGS) ×2 IMPLANT
CLAMP CORD UMBIL (MISCELLANEOUS) IMPLANT
CONTAINER PREFILL 10% NBF 15ML (MISCELLANEOUS) IMPLANT
DRAPE SHEET LG 3/4 BI-LAMINATE (DRAPES) IMPLANT
DRSG OPSITE POSTOP 4X10 (GAUZE/BANDAGES/DRESSINGS) ×2 IMPLANT
DURAPREP 26ML APPLICATOR (WOUND CARE) ×2 IMPLANT
ELECT REM PT RETURN 9FT ADLT (ELECTROSURGICAL) ×2
ELECTRODE REM PT RTRN 9FT ADLT (ELECTROSURGICAL) ×1 IMPLANT
EXTRACTOR VACUUM M CUP 4 TUBE (SUCTIONS) IMPLANT
GLOVE BIOGEL PI IND STRL 6.5 (GLOVE) ×1 IMPLANT
GLOVE BIOGEL PI IND STRL 7.0 (GLOVE) ×5 IMPLANT
GLOVE BIOGEL PI INDICATOR 6.5 (GLOVE) ×1
GLOVE BIOGEL PI INDICATOR 7.0 (GLOVE) ×5
GLOVE SURG SS PI 6.0 STRL IVOR (GLOVE) ×2 IMPLANT
GOWN STRL REUS W/TWL LRG LVL3 (GOWN DISPOSABLE) ×6 IMPLANT
KIT ABG SYR 3ML LUER SLIP (SYRINGE) ×2 IMPLANT
NEEDLE HYPO 25X5/8 SAFETYGLIDE (NEEDLE) ×2 IMPLANT
NS IRRIG 1000ML POUR BTL (IV SOLUTION) ×2 IMPLANT
PACK C SECTION WH (CUSTOM PROCEDURE TRAY) ×2 IMPLANT
PAD ABD 7.5X8 STRL (GAUZE/BANDAGES/DRESSINGS) ×2 IMPLANT
PAD OB MATERNITY 4.3X12.25 (PERSONAL CARE ITEMS) ×2 IMPLANT
PENCIL SMOKE EVAC W/HOLSTER (ELECTROSURGICAL) ×2 IMPLANT
RTRCTR C-SECT PINK 25CM LRG (MISCELLANEOUS) ×2 IMPLANT
SEPRAFILM MEMBRANE 5X6 (MISCELLANEOUS) IMPLANT
SPONGE GAUZE 4X4 12PLY STER LF (GAUZE/BANDAGES/DRESSINGS) ×2 IMPLANT
STRIP CLOSURE SKIN 1/2X4 (GAUZE/BANDAGES/DRESSINGS) ×2 IMPLANT
SUT PLAIN 0 NONE (SUTURE) IMPLANT
SUT VIC AB 0 CT1 36 (SUTURE) ×8 IMPLANT
SUT VIC AB 4-0 KS 27 (SUTURE) ×2 IMPLANT
TOWEL OR 17X24 6PK STRL BLUE (TOWEL DISPOSABLE) ×4 IMPLANT
TRAY FOLEY CATH SILVER 14FR (SET/KITS/TRAYS/PACK) IMPLANT

## 2015-08-25 NOTE — Progress Notes (Signed)
Patient ID: Gabriella Griffin, female   DOB: [birth year], 25 y.o.   MRN: [id number] Patient has been pushing for almost 3 hours without descent of fetus. Patient is exhausted and is ready for a cesarean section. Risks, benefits and alternatives were reviewed with the patient including but not limited to risks of bleeding, infection and damage to adjacent organs. Patient verbalized understanding and all questions were answered

## 2015-08-25 NOTE — Progress Notes (Signed)
Lab at bedside unable to obtain blood sample and unable to find vein.  Vein finder used by RN to assist. Daiva Nakayama, CNM called and order for arterial blood draw obtained. RT notified.

## 2015-08-25 NOTE — Transfer of Care (Signed)
Immediate Anesthesia Transfer of Care Note  Patient: Gabriella Griffin  Procedure(s) Performed: Procedure(s): CESAREAN SECTION (N/A)  Patient Location: PACU  Anesthesia Type:Epidural  Level of Consciousness: awake, alert  and oriented  Airway & Oxygen Therapy: Patient Spontanous Breathing and Patient connected to nasal cannula oxygen  Post-op Assessment: Report given to RN and Post -op Vital signs reviewed and stable  Post vital signs: Reviewed and stable  Last Vitals:  Filed Vitals:   08/24/15 2342 08/25/15 0002  BP: 126/73   Pulse: 99   Temp:    Resp:  18    Complications: No apparent anesthesia complications

## 2015-08-25 NOTE — Progress Notes (Signed)
IFSE off with SVE.

## 2015-08-25 NOTE — Progress Notes (Signed)
This note also relates to the following rows which could not be included: Pulse Rate - Cannot attach notes to unvalidated device data SpO2 - Cannot attach notes to unvalidated device data   

## 2015-08-25 NOTE — Anesthesia Postprocedure Evaluation (Signed)
Anesthesia Post Note  Patient: Gabriella Griffin  Procedure(s) Performed: Procedure(s) (LRB): CESAREAN SECTION (N/A)  Patient location during evaluation: PICU Anesthesia Type: Epidural Level of consciousness: awake and alert Pain management: pain level controlled Vital Signs Assessment: post-procedure vital signs reviewed and stable Respiratory status: spontaneous breathing, nonlabored ventilation, respiratory function stable and patient connected to nasal cannula oxygen Cardiovascular status: stable Postop Assessment: no headache, no backache and epidural receding Anesthetic complications: no    Last Vitals:  Filed Vitals:   08/25/15 0002 08/25/15 0215  BP:  111/61  Pulse:    Temp:  36.7 C  Resp: 18     Last Pain:  Filed Vitals:   08/25/15 0234  PainSc: 0-No pain                 Zenaida Deed

## 2015-08-25 NOTE — Progress Notes (Signed)
Mg d/c @0200  per Loney Loh, MD

## 2015-08-25 NOTE — Op Note (Signed)
Gabriella Griffin PROCEDURE DATE: 08/25/2015  PREOPERATIVE DIAGNOSIS: Intrauterine pregnancy at  [redacted]w[redacted]d weeks gestation; failure to progress: arrest of descent  POSTOPERATIVE DIAGNOSIS: The same  PROCEDURE:     Cesarean Section  SURGEON:  Dr. [name]  ASSISTANT: none  INDICATIONS: Gabriella Griffin is a 25 y.o. G1P0 at [redacted]w[redacted]d scheduled for cesarean section secondary to failure to progress: arrest of descent.  The risks of cesarean section discussed with the patient included but were not limited to: bleeding which may require transfusion or reoperation; infection which may require antibiotics; injury to bowel, bladder, ureters or other surrounding organs; injury to the fetus; need for additional procedures including hysterectomy in the event of a life-threatening hemorrhage; placental abnormalities wth subsequent pregnancies, incisional problems, thromboembolic phenomenon and other postoperative/anesthesia complications. The patient concurred with the proposed plan, giving informed written consent for the procedure.    FINDINGS:  Viable female infant in cephalic presentation.  Apgars 6 and 8, weight pending.  Thick meconium. Nuchal cord x 1 Intact placenta, three vessel cord.  Normal uterus, fallopian tubes and ovaries bilaterally.  ANESTHESIA:    Spinal INTRAVENOUS FLUIDS:1200 ml ESTIMATED BLOOD LOSS: 400 ml URINE OUTPUT:  100 ml SPECIMENS: Placenta sent to L&D COMPLICATIONS: None immediate  PROCEDURE IN DETAIL:  The patient received intravenous antibiotics and had sequential compression devices applied to her lower extremities while in the preoperative area.  She was then taken to the operating room where anesthesia was induced and was found to be adequate. A foley catheter was placed into her bladder and attached to Gabriella Griffin gravity. She was then placed in a dorsal supine position with a leftward tilt, and prepped and draped in a sterile manner. After an adequate timeout was performed, a  Pfannenstiel skin incision was made with scalpel and carried through to the underlying layer of fascia. The fascia was incised in the midline and this incision was extended bilaterally using the Mayo scissors. Kocher clamps were applied to the superior aspect of the fascial incision and the underlying rectus muscles were dissected off bluntly. A similar process was carried out on the inferior aspect of the facial incision. The rectus muscles were separated in the midline bluntly and the peritoneum was entered bluntly. The Alexis self-retaining retractor was introduced into the abdominal cavity. Attention was turned to the lower uterine segment where a transverse hysterotomy was made with a scalpel and extended bilaterally bluntly. The infant was successfully delivered, and cord was clamped and cut and infant was handed over to awaiting neonatology team. Uterine massage was then administered and the placenta delivered intact with three-vessel cord. The uterus was cleared of clot and debris.  The hysterotomy was closed with 0 Vicryl in a running locked fashion, and an imbricating layer was also placed with a 0 Vicryl. Overall, excellent hemostasis was noted. The pelvis copiously irrigated and cleared of all clot and debris. Hemostasis was confirmed on all surfaces.  The peritoneum and the muscles were reapproximated using 0 vicryl interrupted stitches. The fascia was then closed using 0 Vicryl in a running fashion.  The skin was closed in a subcuticular fashion using 3.0 Vicryl. The patient tolerated the procedure well. Sponge, lap, instrument and needle counts were correct x 2. She was taken to the recovery room in stable condition.    Gabriella Griffin,PEGGYMD  08/25/2015 2:04 AM

## 2015-08-25 NOTE — Progress Notes (Signed)
Dr. Elly Modena remains at bedside assisting with pushing.

## 2015-08-25 NOTE — Progress Notes (Signed)
IUPC out, palpating ucs

## 2015-08-25 NOTE — Anesthesia Postprocedure Evaluation (Signed)
Anesthesia Post Note  Patient: Gabriella Griffin  Procedure(s) Performed: Procedure(s) (LRB): CESAREAN SECTION (N/A)  Patient location during evaluation: Antenatal Anesthesia Type: Epidural Level of consciousness: awake and alert Pain management: pain level controlled Vital Signs Assessment: post-procedure vital signs reviewed and stable Respiratory status: spontaneous breathing Cardiovascular status: stable Postop Assessment: no headache, no backache and no signs of nausea or vomiting Anesthetic complications: no    Last Vitals:  Filed Vitals:   08/25/15 0720 08/25/15 0725  BP:    Pulse: 94 97  Temp:    Resp:      Last Pain:  Filed Vitals:   08/25/15 0755  PainSc: [place]

## 2015-08-25 NOTE — Progress Notes (Signed)
Resting from pushing.

## 2015-08-25 NOTE — Progress Notes (Signed)
Monitors off, to OR.

## 2015-08-25 NOTE — Progress Notes (Signed)
Post Partum Day#0/POD#0  Subjective:  Gabriella Griffin is a 25 y.o. G1P1001 [redacted]w[redacted]d s/p pLTCS for arrest of descent. Patient with severe preeclampsia.    Objective: Blood pressure 116/62, pulse 87, temperature 98.2 F (36.8 C), temperature source Axillary, resp. rate 18, height 5\' 2"  (1.575 m), weight 144 lb 8 oz (65.545 kg), last menstrual period 12/04/2014, SpO2 91 %, unknown if currently breastfeeding.  Physical Exam:  General: alert, cooperative and no distress Lochia:normal flow Chest: normal WOB Heart: Regular rate   Recent Labs  08/24/15 0928 08/25/15 0545  HGB 10.5* 9.3*  HCT 30.6* 27.0*   I/O last 3 completed shifts: In: 6975.7 [P.O.:2290; I.V.:4572.7; IV Piggyback:113] Out: [id number]; Blood:400] Total I/O In: 1142.5 [P.O.:780; I.V.:362.5] Out: 1175 [id number]  Assessment/Plan:  ASSESSMENT: Gabriella Griffin is a 25 y.o. G1P1001 [redacted]w[redacted]d s/p pLTCS   #Preeclampsia:  - BP improved - Continue magnesium for 24 hrs - s/p Lasix 20mg  IV  #Postpartum care: Continue routine PP care Breastfeeding support PRN  LOS: 11 days   Caren Macadam 08/25/2015, 4:16 PM

## 2015-08-25 NOTE — Progress Notes (Signed)
Lab at bedside attempting to draw blood.

## 2015-08-26 ENCOUNTER — Encounter (HOSPITAL_COMMUNITY): Payer: Self-pay | Admitting: Obstetrics and Gynecology

## 2015-08-26 LAB — COMPREHENSIVE METABOLIC PANEL
ALBUMIN: 1.7 g/dL — AB (ref 3.5–5.0)
ALK PHOS: 123 U/L (ref 38–126)
ALT: 15 U/L (ref 14–54)
ANION GAP: 9 (ref 5–15)
AST: 44 U/L — ABNORMAL HIGH (ref 15–41)
BILIRUBIN TOTAL: 2.6 mg/dL — AB (ref 0.3–1.2)
BUN: 18 mg/dL (ref 6–20)
CALCIUM: 6.5 mg/dL — AB (ref 8.9–10.3)
CO2: 23 mmol/L (ref 22–32)
Chloride: 101 mmol/L (ref 101–111)
Creatinine, Ser: 1.26 mg/dL — ABNORMAL HIGH (ref 0.44–1.00)
GFR, EST NON AFRICAN AMERICAN: 59 mL/min — AB (ref >60)
Glucose, Bld: 105 mg/dL — ABNORMAL HIGH (ref 65–99)
POTASSIUM: 4.1 mmol/L (ref 3.5–5.1)
Sodium: 133 mmol/L — ABNORMAL LOW (ref 135–145)
TOTAL PROTEIN: 5 g/dL — AB (ref 6.5–8.1)

## 2015-08-26 LAB — CBC
HEMATOCRIT: 24.5 % — AB (ref 36.0–46.0)
HEMOGLOBIN: 8.5 g/dL — AB (ref 12.0–15.0)
MCH: 30.9 pg (ref 26.0–34.0)
MCHC: 34.7 g/dL (ref 30.0–36.0)
MCV: 89.1 fL (ref 78.0–100.0)
Platelets: 269 10*3/uL (ref 150–400)
RBC: 2.75 MIL/uL — ABNORMAL LOW (ref 3.87–5.11)
RDW: 18.8 % — AB (ref 11.5–15.5)
WBC: 21.3 10*3/uL — ABNORMAL HIGH (ref 4.0–10.5)

## 2015-08-26 MED ORDER — HYDROXYUREA 500 MG PO CAPS: 1000.0000 mg | ORAL_CAPSULE | Freq: Every day | ORAL | Status: DC

## 2015-08-26 MED ORDER — HYDROXYUREA 100 MG/ML ORAL SUSPENSION: 15.0000 mg/kg | Freq: Every day | ORAL | Status: DC

## 2015-08-26 MED ORDER — HYDROMORPHONE HCL 2 MG PO TABS
4.0000 mg | ORAL_TABLET | ORAL | Status: DC | PRN
  Administered 2015-08-26: 4 mg via ORAL
  Filled 2015-08-26: qty 2

## 2015-08-26 MED ORDER — HYDROMORPHONE HCL 2 MG PO TABS
2.0000 mg | ORAL_TABLET | ORAL | Status: DC | PRN
  Administered 2015-08-26 – 2015-08-27 (×4): 2 mg via ORAL
  Administered 2015-08-28 – 2015-08-29 (×4): 4 mg via ORAL
  Filled 2015-08-26 (×2): qty 1
  Filled 2015-08-26: qty 2
  Filled 2015-08-26: qty 1
  Filled 2015-08-26: qty 2
  Filled 2015-08-26: qty 1
  Filled 2015-08-26 (×2): qty 2

## 2015-08-26 MED ORDER — HYDROMORPHONE HCL 2 MG PO TABS: 2.0000 mg | ORAL_TABLET | ORAL | Status: DC | PRN

## 2015-08-26 NOTE — Progress Notes (Signed)
Encouraged patient to go go NICU.  Escorted to nicu vai wheelchair

## 2015-08-26 NOTE — Progress Notes (Signed)
Post Partum Day 1 Subjective:  Gabriella Griffin is a 25 y.o. G1P1001 [redacted]w[redacted]d s/p pLTCS.  She underwent IOL for severe preeclampsia.   No acute events overnight. No chest pain or shortness of breath.   Pt denies problems with ambulating, voiding or po intake.  She denies nausea or vomiting.  Pain is poorly controlled.  She has had flatus. She has not had bowel movement.  Lochia Moderate.  Plan for birth control is undecided.  Method of Feeding: Breast.  Objective: Blood pressure 118/77, pulse 85, temperature 97.6 F (36.4 C), temperature source Oral, resp. rate 16, height 5\' 2"  (1.575 m), weight 65.545 kg (144 lb 8 oz), last menstrual period 12/04/2014, SpO2 98 %, unknown if currently breastfeeding.  Physical Exam:  General: alert, cooperative and no distress Lochia:normal flow Chest: CTAB Heart: RRR no m/r/g Abdomen: +BS, soft, Mild distension with diffuse tenderness. No rebound or guarding.  Incision: clean, dry, intact Uterine Fundus: firm DVT Evaluation: No evidence of DVT seen on physical exam. Extremities: No edema   Recent Labs  08/25/15 0545 08/26/15 0540  HGB 9.3* 8.5*  HCT 27.0* 24.5*   Cr: 1.26  Assessment/Plan:  ASSESSMENT: Gabriella Griffin is a 25 y.o. G1P1001 [redacted]w[redacted]d s/p pLTCS for arrest of descent. Patient underwent IOL for severe preeclampsia. Also currently having sickle cell pain crisis.   Preeclampsia - BP improving, off mag since 0200 - Minus 1L yesterday, still up 8.6L this hospitalization - LFTs improving.  - Cr trending up to 1.26  Postpartum Care -Continue routine PP care  Sickle Cell Pain Crisis. - Patient lost IV overnight. Now on oral dialudid - Hgb stable.    LOS: 12 days   Dimas Chyle 08/26/2015, 7:41 AM

## 2015-08-26 NOTE — Progress Notes (Signed)
Subjective:Sore but pain crisis resolved Postpartum Day  1: Cesarean Delivery Patient reports incisional pain, tolerating PO and no problems voiding.    Objective: Vital signs in last 24 hours: Temp:  [97.6 F (36.4 C)-98.2 F (36.8 C)] 97.6 F (36.4 C) (01/10 0521) Pulse Rate:  [73-97] 85 (01/10 0521) Resp:  [16-18] 16 (01/10 0521) BP: (86-137)/(47-88) 118/77 mmHg (01/10 0521) SpO2:  [91 %-99 %] 98 % (01/10 0521)  Physical Exam:  General: alert, cooperative and mild distress Lochia: appropriate Uterine Fundus: firm Incision: no significant drainage DVT Evaluation: No evidence of DVT seen on physical exam.   Recent Labs  08/25/15 0545 08/26/15 0540  HGB 9.3* 8.5*  HCT 27.0* 24.5*    Assessment/Plan: Status post Cesarean section. Doing well postoperatively.  Continue current care.  Yunique Dearcos 08/26/2015, 9:03 AM

## 2015-08-26 NOTE — Lactation Note (Signed)
This note was copied from the chart of Gabriella Copeland Gaudreault. Lactation Consultation Note New mom had PIH, stay in L&D on mag. Until transferred to 309. Baby in NICU d/tfrom narcotics. Mom has Sickle cell and hospitalized frequently for pain treatments of Dilaudid and PCA's. Mom has generalized edema. Mom hasn't eaten solid food for 4 days, per mom. Has been on a clear liquid diet.  DEBP set up, mom pumped 15 minutes. Cleaning and set instructed. Mom knows to pump q3h for 15-20 min. Mom encouraged to do skin-to-skin when visiting baby if possible. Hand expression taught w/"rusty pipe syndrome" noted. Collected 75ml. Mom has mulitple scars to body. Abd. Is distended. Breast are small w/wide space between them good everted nipples. Speaks good English, would like all teaching material in [place]. NICU and BF support booklet given. Patient Name: Gabriella Griffin [id number] Date: 08/26/2015 Reason for consult: Initial assessment   Maternal Data Has patient been taught Hand Expression?: Yes Does the patient have breastfeeding experience prior to this delivery?: No  Feeding Feeding Type: Bottle Fed - Formula Nipple Type: Slow - flow  LATCH Score/Interventions       Type of Nipple: Everted at rest and after stimulation        Intervention(s): Skin to skin;Position options;Support Pillows;Breastfeeding basics reviewed     Lactation Tools Discussed/Used Tools: Pump Breast pump type: Double-Electric Breast Pump WIC Program: Yes Pump Review: Setup, frequency, and cleaning;Milk Storage Initiated by:: Allayne Stack RN Date initiated:: 08/26/15   Consult Status Consult Status: Follow-up Date: 08/26/15 Follow-up type: In-patient    Taneya Conkel, Elta [place] 08/26/2015, 3:13 AM

## 2015-08-27 ENCOUNTER — Inpatient Hospital Stay (HOSPITAL_COMMUNITY): Payer: Medicaid Other

## 2015-08-27 LAB — COMPREHENSIVE METABOLIC PANEL
ALBUMIN: 2 g/dL — AB (ref 3.5–5.0)
ALT: 16 U/L (ref 14–54)
ANION GAP: 5 (ref 5–15)
AST: 36 U/L (ref 15–41)
Alkaline Phosphatase: 133 U/L — ABNORMAL HIGH (ref 38–126)
BUN: 17 mg/dL (ref 6–20)
CHLORIDE: 104 mmol/L (ref 101–111)
CO2: 27 mmol/L (ref 22–32)
Calcium: 6.8 mg/dL — ABNORMAL LOW (ref 8.9–10.3)
Creatinine, Ser: 0.95 mg/dL (ref 0.44–1.00)
GFR calc non Af Amer: >60 mL/min (ref >60)
GLUCOSE: 87 mg/dL (ref 65–99)
Potassium: 3.8 mmol/L (ref 3.5–5.1)
SODIUM: 136 mmol/L (ref 135–145)
Total Bilirubin: 2.5 mg/dL — ABNORMAL HIGH (ref 0.3–1.2)
Total Protein: 6 g/dL — ABNORMAL LOW (ref 6.5–8.1)

## 2015-08-27 LAB — CBC
HCT: 24.1 % — ABNORMAL LOW (ref 36.0–46.0)
Hemoglobin: 8.2 g/dL — ABNORMAL LOW (ref 12.0–15.0)
MCH: 31.2 pg (ref 26.0–34.0)
MCHC: 34 g/dL (ref 30.0–36.0)
MCV: 91.6 fL (ref 78.0–100.0)
PLATELETS: 425 10*3/uL — AB (ref 150–400)
RBC: 2.63 MIL/uL — AB (ref 3.87–5.11)
RDW: 19 % — ABNORMAL HIGH (ref 11.5–15.5)
WBC: 23.4 10*3/uL — ABNORMAL HIGH (ref 4.0–10.5)

## 2015-08-27 MED ORDER — PROMETHAZINE HCL 25 MG PO TABS
25.0000 mg | ORAL_TABLET | Freq: Four times a day (QID) | ORAL | Status: DC | PRN
  Administered 2015-08-27: 25 mg via ORAL
  Filled 2015-08-27: qty 1

## 2015-08-27 MED ORDER — AMOXICILLIN-POT CLAVULANATE 875-125 MG PO TABS
1.0000 | ORAL_TABLET | Freq: Two times a day (BID) | ORAL | Status: DC
  Administered 2015-08-27 – 2015-08-29 (×4): 1 via ORAL
  Filled 2015-08-27 (×4): qty 1

## 2015-08-27 MED ORDER — IBUPROFEN 600 MG PO TABS: 600.0000 mg | ORAL_TABLET | Freq: Four times a day (QID) | ORAL | Status: DC

## 2015-08-27 MED ORDER — SODIUM CHLORIDE 0.9 % IV SOLN: INTRAVENOUS | Status: DC

## 2015-08-27 MED ORDER — PROMETHAZINE HCL 25 MG RE SUPP: 25.0000 mg | Freq: Four times a day (QID) | RECTAL | Status: DC | PRN

## 2015-08-27 MED ORDER — SODIUM CHLORIDE 0.9 % IV BOLUS (SEPSIS): 500.0000 mL | Freq: Once | INTRAVENOUS | Status: DC

## 2015-08-27 MED ORDER — PIPERACILLIN-TAZOBACTAM 3.375 G IVPB 30 MIN
3.3750 g | Freq: Three times a day (TID) | INTRAVENOUS | Status: DC
  Filled 2015-08-27 (×2): qty 50

## 2015-08-27 MED ORDER — METOCLOPRAMIDE HCL 10 MG PO TABS
10.0000 mg | ORAL_TABLET | Freq: Three times a day (TID) | ORAL | Status: DC
  Administered 2015-08-27 – 2015-08-29 (×4): 10 mg via ORAL
  Filled 2015-08-27 (×4): qty 1

## 2015-08-27 MED ORDER — HYDROMORPHONE HCL 2 MG PO TABS: 2.0000 mg | ORAL_TABLET | ORAL | Status: AC | PRN

## 2015-08-27 NOTE — Progress Notes (Signed)
CSW received call regarding neonatal loss and request for support to family.  CSW spoke with K. Claussen/Chaplain who is involved and will contact CSW if additional needs arise.

## 2015-08-27 NOTE — Discharge Instructions (Signed)
Cesarean Delivery, Care After Refer to this sheet in the next few weeks. These instructions provide you with information on caring for yourself after your procedure. Your health care provider may also give you specific instructions. Your treatment has been planned according to current medical practices, but problems sometimes occur. Call your health care provider if you have any problems or questions after you go home.  WHAT TO EXPECT AFTER THE PROCEDURE  After your procedure, it is typical to have the following:  You may feel very fatigued.  You may have pain at the incision site for several days. The incision will probably continue to be tender to the touch for several weeks. Your incision will take about 4-6 weeks to heal.  You may have vaginal bleeding and discharge that will start out red, then become pink, then yellow, then white. Even though you did not have a vaginal delivery, you will still have vaginal bleeding and discharge. Altogether, this usually lasts for about 6 weeks.  The combination of having lost your baby and changing hormones from the delivery can make you feel very sad. You may also experience emotions that change very quickly. Some of the emotions people notice after loss include:  Anger.  Denial.  Guilt.  Sorrow.  Depression.  Grief.  Relationship problems. HOME CARE INSTRUCTIONS   Consider seeking support for your loss. Some forms of support that you might consider include your religious leader, friends, family, a Medical illustrator, or a bereavement support group.  Take medicines only as directed by your health care provider.  Continue to use good perineal care. Good perineal care includes:   Wiping your perineum from front to back.   Keeping your perineum clean.   Check your incision daily for increased redness, drainage, swelling, or separation of skin.   Clean your incision gently with soap and water every day, and then pat it dry. If your  health care provider says it is okay, leave the incision uncovered. Use a bandage (dressing) if the incision is draining fluid or appears irritated. If the adhesive strips across the incision do not fall off within 7 days, carefully peel them off or as directed by your health care provider.   Hug a pillow when coughing or sneezing until your incision is healed. This helps to relieve pain.   Shower, wash your hair, and take tub baths as directed by your health care provider.   Wear a well-fitting bra that provides breast support.   Limit wearing support panties or control-top hose.   Drink enough fluids to keep your urine clear or pale yellow.   Eat high-fiber foods such as whole grain cereals and breads, brown rice, beans, and fresh fruits and vegetables every day. These foods may help prevent or relieve constipation.   Follow your health care provider's directions about resuming activities such as climbing stairs, driving, lifting, exercising, or traveling.  Increase your activities gradually.   Talk to your health care provider about resuming sexual activities. This depends on your risk of infection, your rate of healing, and your comfort and desire to resume sexual activity.   Try to have someone help you with your household activities for at least a few days after you leave the hospital.   Rest as much as possible.  Keep all of your scheduled postpartum appointments. It is very important to keep your scheduled follow-up appointments. At these appointments, your health care provider will be checking to make sure that you are healing physically and  emotionally.   Do not use tampons or douche until your health care provider says it is okay.   Do not drink alcohol, especially if you are taking medicine to relieve pain.   Do not use any tobacco products including cigarettes, chewing tobacco, or electronic cigarettes. If you need help quitting, ask your health care  provider.  Do not use illegal drugs. SEEK MEDICAL CARE IF:   You feel sad or depressed.   You have thoughts of hurting yourself.  You are having trouble eating or sleeping.  You cannot enjoy the things in life you have previously enjoyed.  You are passing large clots from your vagina. Save any clots to show your health care provider.  You have heavy red vaginal flow (like your usual menstrual period) for more than four days after your cesarean delivery.  You have a foul smelling discharge from your vagina.   You have pain or trouble urinating or are urinating frequently.  You have a change in your bowel movements.   You have increasing redness, pain, or swelling near your incision.   You have pus draining from your incision.   Your incision is separating.   You have painful, hard, or reddened breasts.   You have a severe headache.   You have blurred vision or see spots.   You are dizzy or light-headed.   You have a rash.   You have pain, redness, or swelling at the site of the removed IV tube.   You have nausea or vomiting.   You have not had a menstrual period within 12 weeks of delivery.   You have a fever. SEEK IMMEDIATE MEDICAL CARE IF:   You are concerned that you may hurt yourself or you are considering suicide.   You have persistent pain.   You have chest pain.   You have shortness of breath.   You faint.   You have leg pain or swelling.   You have stomach pain.   Your vaginal bleeding saturates 2 or more sanitary pads in 1 hour. MAKE SURE YOU:   Understand these instructions.  Will watch your condition.  Will get help right away if you are not doing well or get worse.   This information is not intended to replace advice given to you by your health care provider. Make sure you discuss any questions you have with your health care provider.   Document Released: 12/17/2013 Document Reviewed: 12/17/2013 Elsevier  Interactive Patient Education [employer].

## 2015-08-27 NOTE — Progress Notes (Signed)
I spent time with family throughout the day (greater than 3-4 hours total time working with them and assisting them with arrangements).  Family requested assistance with making arrangements for their baby.  The family wanted to choose burial rather than cremation for religious reasons, but had limited financial resources.  I assisted them in filling out an application to the [employer] and helped them think through how to make arrangements.  The family was also very concerned both about baby's care and about mother's care.  I spent time listening to the family members and also explaining about HIPAA and why the staff may be asking them to step out or may not be giving them details.  The pt's sister was angry and concerned that the staff gave up on the baby because the baby was not white.  I listened to her and assured her that our staff care for patients of all backgrounds with the same compassionate care.  I also explained how grief can have very physical pain related to it as well and how there can be many layers of pain making it complicated to figure out what may be a contributing factor.  I have alerted Chaplains Ree Edman and Karsten Ro Lomax that Arlissa may be staying over tonight.  Please page them as needs arise this evening and tomorrow.  [place], [place] Pager, [phone] 4:56 PM    08/27/15 1600  Clinical Encounter Type  Visited With Patient and family together;Patient  Visit Type Spiritual support  Referral From Social work;Nurse;Chaplain  Consult/Referral To [employer] community  Spiritual Encounters  Spiritual Needs Grief support;Emotional

## 2015-08-27 NOTE — Progress Notes (Addendum)
Assumed care from [facility], and during my patient rounding, family of patient and the patient confronted with questions concerning the care given to them over here. She made mentioned of not being attended to whenever she needed help and felt like she was neglected. Patient and family also has concerns about dietary and some daytime staff.  Night time house coverage was called to address patient's concerns and spanish interpreter for the shift was also called to explain plan of care and what to expect while being hospitalized. Spanish interpreter was called because patient and family members had a misconception about her incision gaping if patient makes attempt to ambulate and also family and patient made me know that her condition is exceptional and need not to do a lot for herself.  RN at this time encouraged and educated patient and family members the need for early ambulation after surgery and to prevent any form of infections or complications when attempts to ambulate. Any forms of anxiety was relayed.  Patient and family members were reassured that staff would do our best to give out our best of care for their rest of stay here.

## 2015-08-27 NOTE — Progress Notes (Signed)
Patient ID: Gabriella Griffin, female   DOB: 1990/08/17, 25 y.o.   MRN: [id number]   Subjective: Interval History:Patient discharged this am. But has stayed to stay with her baby, who has deceased. She has developed dizziness and abdominal pain and back pain.  Pain is similar to her sickle cell pain. Requests IV Dilaudid as the Percocet gives her dizziness and bad dreams. She feels dehydrated. She is tired and has not eaten much today.  Objective: Vital signs in last 24 hours: Temp:  [99 F (37.2 C)-99.2 F (37.3 C)] 99 F (37.2 C) (01/11 0100) Pulse Rate:  [89-92] 90 (01/11 0100) Resp:  [18] 18 (01/10 2219) BP: (124-130)/(65-69) 130/65 mmHg (01/11 0100) SpO2:  [94 %-97 %] 96 % (01/11 0100)  Intake/Output from previous day: 01/10 0701 - 01/11 0700 In: 840 [P.O.:840] Out: 2150 [Urine:2150] Intake/Output this shift:  Orthostatics are negative  General appearance: alert, cooperative and appears stated age Neck: supple, symmetrical, trachea midline Lungs: normal effort Abdomen: soft, there is a mass in the RUQ pain, that is tender. the uterus is minimally tender and firm at the level of the umbilicus. Skin: incision is clean and dry---[place] in place.  Results for orders placed or performed during the hospital encounter of 08/14/15 (from the past 24 hour(s))  CBC     Status: Abnormal   Collection Time: 08/27/15  7:46 AM  Result Value Ref Range   WBC 23.4 (H) 4.0 - 10.5 K/uL   RBC 2.63 (L) 3.87 - 5.11 MIL/uL   Hemoglobin 8.2 (L) 12.0 - 15.0 g/dL   HCT 24.1 (L) 36.0 - 46.0 %   MCV 91.6 78.0 - 100.0 fL   MCH 31.2 26.0 - 34.0 pg   MCHC 34.0 30.0 - 36.0 g/dL   RDW 19.0 (H) 11.5 - 15.5 %   Platelets 425 (H) 150 - 400 K/uL  Comprehensive metabolic panel     Status: Abnormal   Collection Time: 08/27/15  7:46 AM  Result Value Ref Range   Sodium 136 135 - 145 mmol/L   Potassium 3.8 3.5 - 5.1 mmol/L   Chloride 104 101 - 111 mmol/L   CO2 27 22 - 32 mmol/L   Glucose, Bld 87 65 - 99  mg/dL   BUN 17 6 - 20 mg/dL   Creatinine, Ser 0.95 0.44 - 1.00 mg/dL   Calcium 6.8 (L) 8.9 - 10.3 mg/dL   Total Protein 6.0 (L) 6.5 - 8.1 g/dL   Albumin 2.0 (L) 3.5 - 5.0 g/dL   AST 36 15 - 41 U/L   ALT 16 14 - 54 U/L   Alkaline Phosphatase 133 (H) 38 - 126 U/L   Total Bilirubin 2.5 (H) 0.3 - 1.2 mg/dL   GFR calc non Af Amer >60 >60 mL/min   GFR calc Af Amer >60 >60 mL/min   Anion gap 5 5 - 15    Studies/Results: [place] [facility] Ob Follow Up  08/01/2015  OBSTETRICAL ULTRASOUND: This exam was performed within a [facility]. The OB US report was generated in the AS system, and faxed to the ordering physician.  This report is available in the [facility]. See the [facility] Obstetric US report via the Image Link.   Scheduled Meds: . sodium chloride   Intravenous Once  . folic acid  2 mg Oral Daily  . ibuprofen  600 mg Oral 4 times per day  . prenatal multivitamin  1 tablet Oral Q1200  . scopolamine  1 patch Transdermal Once  .  senna-docusate  2 tablet Oral Q24H  . simethicone  80 mg Oral TID PC  . simethicone  80 mg Oral Q24H  . sodium chloride  500 mL Intravenous Once  . Tdap  0.5 mL Intramuscular Once   Continuous Infusions: . sodium chloride    . lactated ringers Stopped (08/26/15 0200)  . lactated ringers    . naLOXone [facility]) adult infusion for PRURITIS     PRN Meds:acetaminophen, witch hazel-glycerin **AND** dibucaine, diphenhydrAMINE **OR** diphenhydrAMINE, diphenhydrAMINE, HYDROmorphone (DILAUDID) injection, HYDROmorphone, lanolin, menthol-cetylpyridinium, nalbuphine **OR** nalbuphine, naLOXone (NARCAN) adult infusion for PRURITIS, naloxone **AND** sodium chloride, polyethylene glycol, simethicone  Assessment/Plan: Principal Problem:   Hb-SS disease with crisis ([place]) Active Problems:   Preeclampsia, severe   Abdominal pain Unclear if pain is related to SS crisis and what if any role the mass is playing.  Her baby dies of GNR sepsis and could she have an early  endometritis. No fever as yet, but she does have elevating WBC and abdominal pain.    Will begin some Abx Resume IVF following a bolus  IV pain meds. Check U/s    LOS: 13 days   Yazid Pop S, MD 08/27/2015 2:47 PM

## 2015-08-27 NOTE — Discharge Summary (Signed)
OB Discharge Summary     Patient Name: Gabriella Griffin DOB: March 31, 1991 MRN: [id number]  Date of admission: 08/14/2015 Delivering MD: [name]   Date of discharge: September 25, 2015  Admitting diagnosis: SICKLE CELL CRISIS Intrauterine pregnancy: [redacted]w[redacted]d     Secondary diagnosis:  Principal Problem:   Hb-SS disease with crisis [facility]) Active Problems:   Supervision of high-risk pregnancy   Maternal sickle cell anemia complicating pregnancy ([place])   Hereditary disease in family possibly affecting fetus, affecting management of mother, antepartum condition or complication   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 yo G1P1001 at [redacted]w[redacted]d was admitted to the hospital 08/14/2015 for induction of labor. Patient had a labor course significant for second stage arrest. The patient went for cesarean section due to Arrest of Descent, and delivered a Viable infant,Membrane Rupture Time/Date: )6:39 PM ,08/23/2015   Details of operation can be found in separate operative Note.  Baby dies in NICU 2015-09-25. She is ambulating, tolerating a regular diet, passing flatus, and urinating well.  Patient is discharged home in stable condition on 25-Sep-2015.                                     Physical exam  Filed Vitals:   08/26/15 1400 08/26/15 1902 08/26/15 2219 09/25/15 0100  BP: 117/69 129/69 124/66 130/65  Pulse: 88 92 89 90  Temp: 97.9 F (36.6 C) 99.1 F (37.3 C) 99.2 F (37.3 C) 99 F (37.2 C)  TempSrc: Oral Oral Oral  Oral  Resp: 18 18 18    Height:      Weight:      SpO2: 95% 97% 94% 96%   General: alert and cooperative, tearful Lochia: appropriate Uterine Fundus: firm Incision: Dressing is clean, dry, and intact DVT Evaluation: No evidence of DVT seen on physical exam. Labs: Lab Results  Component Value Date   WBC 21.3* 08/26/2015   HGB 8.5* 08/26/2015   HCT 24.5* 08/26/2015   MCV 89.1 08/26/2015   PLT 269 08/26/2015   CMP Latest Ref Rng 08/26/2015  Glucose 65 - 99 mg/dL 105(H)  BUN 6 - 20 mg/dL 18  Creatinine 0.44 - 1.00 mg/dL 1.26(H)  Sodium 135 - 145 mmol/L 133(L)  Potassium 3.5 - 5.1 mmol/L 4.1  Chloride 101 - 111 mmol/L 101  CO2 22 - 32 mmol/L 23  Calcium 8.9 - 10.3 mg/dL 6.5(L)  Total Protein 6.5 - 8.1 g/dL 5.0(L)  Total Bilirubin 0.3 - 1.2 mg/dL 2.6(H)  Alkaline Phos 38 - 126 U/L 123  AST  15 - 41 U/L 44(H)  ALT 14 - 54 U/L 15    Discharge instruction: per After Visit Summary and "Baby and Me Booklet".  After visit meds:    Medication List    STOP taking these medications        acetaminophen 500 MG tablet  Commonly known as:  TYLENOL     diphenhydrAMINE 25 mg capsule  Commonly known as:  BENADRYL      TAKE these medications        folic acid 1 MG tablet  Commonly known as:  FOLVITE  Take 2 tablets (2 mg total) by mouth daily.     hydrocortisone cream 1 %  Apply 1 application topically 2 (two) times daily as needed for itching (rash).     HYDROmorphone 2 MG tablet  Commonly known as:  DILAUDID  Take 1 tablet (2 mg total) by mouth every 4 (four) hours as needed for severe pain.     ibuprofen 600 MG tablet  Commonly known as:  ADVIL,MOTRIN  Take 1 tablet (600 mg total) by mouth every 6 (six) hours.     multivitamin-prenatal 27-0.8 MG Tabs tablet  Take 1 tablet by mouth every morning.     oxyCODONE 5 MG immediate release tablet  Commonly known as:  Oxy IR/ROXICODONE  Take 5-10 mg by mouth every 6 (six) hours as needed. pain        Diet: routine  diet  Activity: Advance as tolerated. Pelvic rest for 6 weeks.   Outpatient follow up:4 weeks Follow up Appt:No future appointments. Follow up Visit:No Follow-up on file.  Postpartum contraception: Not Discussed  Newborn Data: Live born female  Birth Weight: 5 lb 8.7 oz (2515 g) APGAR: 6, 8  Baby Feeding:   Disposition:morgue   08/27/2015 Emeterio Reeve, MD

## 2015-08-27 NOTE — Progress Notes (Signed)
CRNA attempted IV restart x2 without success.  Iv team notified and attempted IV restart x1 without success.  Patient refused IV nurse to continue looking for a vein.  Dr. Glo Herring notified.  Patient sitting in chair eating small bites.  States every time she has gone to the bathroom she has passed some soft stool and passing flatus.  Patient refused phenergan suppository, but took phenergan 25mg  po per her choice.  Patient ambulating to bathroom without difficulty.

## 2015-08-27 NOTE — Progress Notes (Signed)
Patient c/o dizziness and generally not feeling well.  Orthostatic vitals signs done (refer to chart).  Dr. Kennon Rounds notified of patients c/o's and will see patient.

## 2015-08-27 NOTE — Progress Notes (Signed)
Subjective: Postpartum Day 2: Cesarean Delivery, baby expired today . Efforts to restart IV unsuccessful x several attempts, and pt refuses further efforts. Will change antibiotics to PO Augmentin. Patient reports nausea, incisional pain, tolerating PO, + flatus and + BM.  Pt was able to keep down phenergan PO. Pt refused rectal suppositories. Will increase fluid intake  Objective: Vital signs in last 24 hours: Temp:  [99 F (37.2 C)-99.6 F (37.6 C)] 99.6 F (37.6 C) (01/11 1806) Pulse Rate:  [89-90] 90 (01/11 1806) Resp:  [18] 18 (01/11 1806) BP: (124-130)/(65-71) 126/71 mmHg (01/11 1806) SpO2:  [94 %-97 %] 97 % (01/11 1806)  Physical Exam:  General: alert, cooperative, fatigued and no distress Lochia: appropriate Uterine Fundus: firm Incision: healing well, no significant drainage, no dehiscence, no erythema. The area to right of midline above umbilicus seems thickened, but no local redness, or guarding. With pt valsalva, it appears to be subcutaneous thickening. U/S shows no discernable mass.in that area, and rest of abdomen is normal. DVT Evaluation: No evidence of DVT seen on physical exam.   Recent Labs  08/26/15 0540 08/27/15 0746  HGB 8.5* 8.2*  HCT 24.5* 24.1*    Assessment/Plan: Status post Cesarean section. Postoperative course complicated by fetal demise, apparent sepsis,, no iv access, pt refusal of further IV attempts  Switch antibiotics to po Augmentin, push po fluids. Ask family to keep visitation numbers in room low for the nite, a second room has been made available for family use. AM labs CBC, lactate level.Jonnie Kind 08/27/2015, 8:32 PM

## 2015-08-27 NOTE — Progress Notes (Signed)
Pt sat up in chair during our visit. Her SO's mother was present as well as her friend who was present in NICU shortly after infant passed. Pt admitted she was tired and just wanted to try to eat something and rest. She said people had been in and out all day.[place] encouraged her to ask her friend (who has been very involved in her care) to ask her visitors to give her the time she needed. [place] encouraged her to be ok w/self-care. During our visit pt said she wishes it had been her instead of her baby. She said she has had sickle cell all her life and she was tired. Pt's friend interjected, with anger, how she did not feel they had not been well cared for. (Pt translated her friend's remarks) Friend said they should have done more for the baby and she did not understand what happened. [place] acknowledged her concerns while encouraging care to pt (mom and dad). Pt's friend's anger was a continuation from the time we spent in NICU. Pt did not present anger, just exhaustion. She, again, said she wanted to eat. CH took initiative to move her tray and food to her seat and concluded the visit. Please page whenever additional support is needed. Chaplain Ernest Haber, M.Div.   08/27/15 2100  Clinical Encounter Type  Visited With Patient and family together

## 2015-08-28 LAB — COMPREHENSIVE METABOLIC PANEL
ALT: 12 U/L — ABNORMAL LOW (ref 14–54)
AST: 25 U/L (ref 15–41)
Albumin: 1.6 g/dL — ABNORMAL LOW (ref 3.5–5.0)
Alkaline Phosphatase: 115 U/L (ref 38–126)
Anion gap: 9 (ref 5–15)
BILIRUBIN TOTAL: 2 mg/dL — AB (ref 0.3–1.2)
BUN: 10 mg/dL (ref 6–20)
CHLORIDE: 106 mmol/L (ref 101–111)
CO2: 24 mmol/L (ref 22–32)
Calcium: 7.5 mg/dL — ABNORMAL LOW (ref 8.9–10.3)
Creatinine, Ser: 0.73 mg/dL (ref 0.44–1.00)
Glucose, Bld: 85 mg/dL (ref 65–99)
POTASSIUM: 3.9 mmol/L (ref 3.5–5.1)
Sodium: 139 mmol/L (ref 135–145)
TOTAL PROTEIN: 4.8 g/dL — AB (ref 6.5–8.1)

## 2015-08-28 LAB — CBC WITH DIFFERENTIAL/PLATELET
BASOS ABS: 0 10*3/uL (ref 0.0–0.1)
Basophils Relative: 0 %
EOS PCT: 0 %
Eosinophils Absolute: 0 10*3/uL (ref 0.0–0.7)
HEMATOCRIT: 19.8 % — AB (ref 36.0–46.0)
HEMOGLOBIN: 6.9 g/dL — AB (ref 12.0–15.0)
LYMPHS ABS: 0.9 10*3/uL (ref 0.7–4.0)
LYMPHS PCT: 4 %
MCH: 31.7 pg (ref 26.0–34.0)
MCHC: 34.8 g/dL (ref 30.0–36.0)
MCV: 90.8 fL (ref 78.0–100.0)
Monocytes Absolute: 1.4 10*3/uL — ABNORMAL HIGH (ref 0.1–1.0)
Monocytes Relative: 6 %
NEUTROS ABS: 19.6 10*3/uL — AB (ref 1.7–7.7)
NEUTROS PCT: 90 %
Platelets: 496 10*3/uL — ABNORMAL HIGH (ref 150–400)
RBC: 2.18 MIL/uL — AB (ref 3.87–5.11)
RDW: 19.6 % — ABNORMAL HIGH (ref 11.5–15.5)
WBC: 21.9 10*3/uL — AB (ref 4.0–10.5)

## 2015-08-28 LAB — LACTIC ACID, PLASMA: LACTIC ACID, VENOUS: 0.8 mmol/L (ref 0.5–2.0)

## 2015-08-28 NOTE — Progress Notes (Signed)
Subjective: Postpartum Day 3: Cesarean Delivery, also sickle cell disease, with baby expired . Patient refused further IV restart efforts. Patient reports tolerating PO, + flatus, + BM and no problems voiding. She has been voiding a lot overnight as well as 3 bm's. She feels better than yesterday, not yet well enough to feel she can go home. She refuses my recommendation of starting PICC line at this time for antibiotics . She notices that abdomen and arms and legs are less swollen. The previously noted ruq fullness, which u/s found nothing abnormal, has diminished as she has diuresed She denies any bone, chest pain or leg pain.  Soc Hx: baby taken to funeral home yesterday evening. I re-expressed our sympathy over her loss, and she stated that she knew that it wasn't anyone's fault but God's will and she was trying to accept that.  Objective: Vital signs in last 24 hours: Temp:  [99.2 F (37.3 C)-100.5 F (38.1 C)] 100.5 F (38.1 C) (01/12 0522) Pulse Rate:  [85-92] 90 (01/12 0522) Resp:  [18] 18 (01/12 0522) BP: (116-149)/(64-78) 143/74 mmHg (01/12 0522) SpO2:  [96 %-98 %] 97 % (01/12 0522) Weight:  [60.952 kg (134 lb 6 oz)] 60.952 kg (134 lb 6 oz) (01/12 0522)  Physical Exam:  General: alert, cooperative, flushed and definitely less swollen Lochia: appropriate Uterine Fundus: difficult to feel,  Incision: healing well, no significant drainage, no significant erythema DVT Evaluation: No evidence of DVT seen on physical exam.   Recent Labs  08/27/15 0746 08/28/15 0555  HGB 8.2* 6.9*  HCT 24.1* 19.8*   CBC Latest Ref Rng 08/28/2015 08/27/2015 08/26/2015  WBC 4.0 - 10.5 K/uL 21.9(H) 23.4(H) 21.3(H)  Hemoglobin 12.0 - 15.0 g/dL 6.9(LL) 8.2(L) 8.5(L)  Hematocrit 36.0 - 46.0 % 19.8(L) 24.1(L) 24.5(L)  Platelets 150 - 400 K/uL 496(H) 425(H) 269      Assessment/Plan: Status post Cesarean section. Postoperative course complicated by persistent leukocytosis, low grade temp 100.5,  without obvious source,   Patient refusing PICC at this time. Pt informed that if temp increases we will need to discuss this again. Plan Continue INpatient care, monitor diuresis Continue PO augmentin 875 Inpatient til afebrile x 24 hours. Jonnie Kind 08/28/2015, 7:27 AM

## 2015-08-28 NOTE — Progress Notes (Addendum)
Follow up visit with Gabriella Griffin. She shared that she is feeling a little better physically today as her swelling is going down and she is starting to be able to eat more.  She did express that she has a lot of pain.    Gabriella Wiess shared that she continues to try to understand why everything happened.  She expressed frustration that she had an uneventful pregnancy and then lost her baby.  She shared that she continues to wrestle with confused feelings.  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  She always said one day if she had a daughter she would be better and when she found out she was pregnant with a girl, she knew she'd correct her mother's errors.  She was dismayed because they didn't have anything for a baby and then they were blessed with everything they needed.  Prayers were answered.  This time they were not.  She expressed that she wasn't even really worried during the NICU stay because she knew it would be okay-despite her husband's fear.   She stated she didn't want to be angry at God and I reminded her that even Jesus asked God why he was forsaken (family practices Catholicism).  I suggested that perhaps we know this to help [place] understand that God is big enough to hear our difficult feelings to know that anger and lament are real pains even Jesus experienced.  We discussed that one of the most difficult truths of life is that sometimes the only way to the other side of pain is through it.  Pt expressed appreciation for the visit and gave chaplain a hug stating it was helpful to talk about these difficult things.  Chaplain will continue to follow. Please page as further needs arise.  Donald Prose. Elyn Peers, M.Div. [facility] Chaplain Pager [phone] Office [phone]

## 2015-08-28 NOTE — Progress Notes (Signed)
Attempted to follow up with patient per our visit this morning.  Pt has several visitors in the room.  Chaplain offered to return tomorrow and pt said she would appreciate that.  Please page as further needs arise.  Donald Prose. Elyn Peers, M.Div. [facility] Chaplain Pager [phone] Office [phone]      08/28/15 1632  Clinical Encounter Type  Visited With Patient and family together  Visit Type Follow-up

## 2015-08-29 LAB — TYPE AND SCREEN
ABO/RH(D): O POS
Antibody Screen: NEGATIVE
UNIT DIVISION: 0
Unit division: 0

## 2015-08-29 MED ORDER — HYDROXYUREA 500 MG PO CAPS
500.0000 mg | ORAL_CAPSULE | Freq: Every day | ORAL | Status: DC
  Filled 2015-08-29: qty 1

## 2015-08-29 MED ORDER — HYDROXYUREA 500 MG PO CAPS: 500.0000 mg | ORAL_CAPSULE | Freq: Every day | ORAL | Status: DC

## 2015-08-29 MED ORDER — AMOXICILLIN-POT CLAVULANATE 875-125 MG PO TABS: 1.0000 | ORAL_TABLET | Freq: Two times a day (BID) | ORAL | Status: DC

## 2015-08-29 NOTE — Discharge Summary (Signed)
OB Discharge Summary     Patient Name: Gabriella Griffin DOB: 1991/01/23 MRN: [id number]  Date of admission: 08/14/2015 Delivering MD: [name]   Date of discharge: 08/29/2015  Admitting diagnosis: SICKLE CELL CRISIS Intrauterine pregnancy: [redacted]w[redacted]d     Secondary diagnosis:  Principal Problem:   Hb-SS disease with crisis [facility]) Active Problems:   Preeclampsia, severe   Abdominal pain  Additional problems: Anemia post delivery                                      Neonatal Death in NICU 09-25-15     Discharge diagnosis: Term Pregnancy Delivered and preeclampsia (severe), and Sickle Cell Disease                                                                                                Post partum procedures:blood transfusion  Augmentation: Pitocin and Cytotec  Complications: None except Neonatal Death  Hospital course:  Induction of Labor With Cesarean Section  25 y.o. yo G1P1001 at [redacted]w[redacted]d was admitted to the hospital 08/14/2015 for induction of labor. Patient had a labor course significant for admitted to the hospital 08/14/2015 for induction of labor. Patient had a labor course significant for second stage arrest. The patient went for cesarean section due to Arrest of Descent, and delivered a Viable infant,Membrane Rupture Time/Date: )6:39 PM ,08/23/2015   Details of operation can be found in separate operative Note. Baby dies in NICU 2015/09/25. She is ambulating, tolerating a regular diet, passing flatus, and urinating well. Patient is discharged home in stable condition on 09-25-15.  . Membrane Rupture Time/Date: )6:39 PM ,08/23/2015   @Details  of operation can be found in separate operative Note.Baby dies in NICU 09-25-15. She is ambulating, tolerating a regular diet, passing flatus, and urinating well. discharged home in stable condition on 08/29/2015.                                     Physical exam  Filed Vitals:   08/28/15 2128  08/29/15 0602 08/29/15 0614 08/29/15 1029  BP: 125/81 125/71  141/81  Pulse: 88 83  80  Temp: 98.6 F (37 C) 99.1 F (37.3 C)  99 F (37.2 C)  TempSrc: Oral Oral  Oral  Resp: 18 16  18   Height:      Weight:   60.442 kg (133 lb 4 oz)   SpO2: 98% 98%  97%   General: alert, cooperative and no distress Lochia: appropriate Uterine Fundus: firm Incision: Healing well with no significant drainage DVT Evaluation: No evidence of DVT seen on physical exam. Labs: Lab Results  Component Value Date   WBC 21.9* 08/28/2015   HGB 6.9* 08/28/2015   HCT 19.8* 08/28/2015   MCV 90.8 08/28/2015   PLT 496* 08/28/2015   CMP Latest Ref Rng 08/28/2015  Glucose 65 - 99 mg/dL 85  BUN 6 - 20 mg/dL 10  Creatinine 0.44 - 1.00 mg/dL 0.73  Sodium 135 - 145 mmol/L 139  Potassium 3.5 - 5.1 mmol/L 3.9  Chloride 101 - 111 mmol/L 106  CO2 22 - 32 mmol/L 24  Calcium 8.9 - 10.3 mg/dL 7.5(L)  Total Protein 6.5 - 8.1 g/dL 4.8(L)  Total Bilirubin 0.3 - 1.2 mg/dL 2.0(H)  Alkaline Phos 38 - 126 U/L 115  AST 15 - 41 U/L 25  ALT 14 - 54 U/L 12(L)    Discharge instruction: per After Visit Summary and "Baby and Me Booklet".  After visit meds:    Medication List    STOP taking these medications        acetaminophen 500 MG tablet  Commonly known as:  TYLENOL     diphenhydrAMINE 25 mg capsule  Commonly known as:  BENADRYL      TAKE these medications        amoxicillin-clavulanate 875-125 MG tablet  Commonly known as:  AUGMENTIN  Take 1 tablet by mouth every 12 (twelve) hours.     folic acid 1 MG tablet  Commonly known as:  FOLVITE  Take 2 tablets (2 mg total) by mouth daily.     hydrocortisone cream 1 %  Apply 1 application topically 2 (two) times daily as needed for itching (rash).     HYDROmorphone 2 MG tablet  Commonly known as:  DILAUDID  Take 1 tablet (2 mg total) by mouth every 4 (four) hours as needed for severe pain.     hydroxyurea 500 MG capsule  Commonly known as:  HYDREA  Take 1  capsule (500 mg total) by mouth daily. May take with food to minimize GI side effects.     ibuprofen 600 MG tablet  Commonly known as:  ADVIL,MOTRIN  Take 1 tablet (600 mg total) by mouth every 6 (six) hours.     multivitamin-prenatal 27-0.8 MG Tabs tablet  Take 1 tablet by mouth every morning.     oxyCODONE 5 MG immediate release tablet  Commonly known as:  Oxy IR/ROXICODONE  Take 5-10 mg by mouth every 6 (six) hours as needed. pain        Diet: routine diet  Activity: Advance as tolerated. Pelvic rest for 6 weeks.   Outpatient follow up:4 weeks Follow up Appt:No future appointments. Follow up Visit:No Follow-up on file.  Postpartum contraception: Not Discussed  Newborn Data: Live born female  Birth Weight: 5 lb 8.7 oz (2515 g) APGAR: 6, 8  Baby Feeding: n/a Disposition:morgue   08/29/2015 Hansel Feinstein, CNM

## 2015-08-29 NOTE — Progress Notes (Signed)
Carmelia Roller, CNM at bedside to discuss discharge with patient.

## 2015-08-29 NOTE — Progress Notes (Signed)
Patient discharged home with significant other... Discharge instructions reviewed with patient and she verbalized understanding, pt made aware she needs to follow up with her Sickle Cell MD as soon as possible... Condition stable... No equipment... Taken to car via wheelchair by Santiago Bur, NT.

## 2015-08-30 NOTE — Progress Notes (Signed)
Dr. Sherlynn Stalls from [facility] sent over patient request for medical records to provide care to patient.

## 2015-11-20 IMAGING — DX DG CHEST 2V
2 series · 2 of 2 positions shown · non-contrast
Comparison: None.

CLINICAL DATA: Cough and congestion for 4 days.

EXAM:
CHEST  2 VIEW

[chest pa]
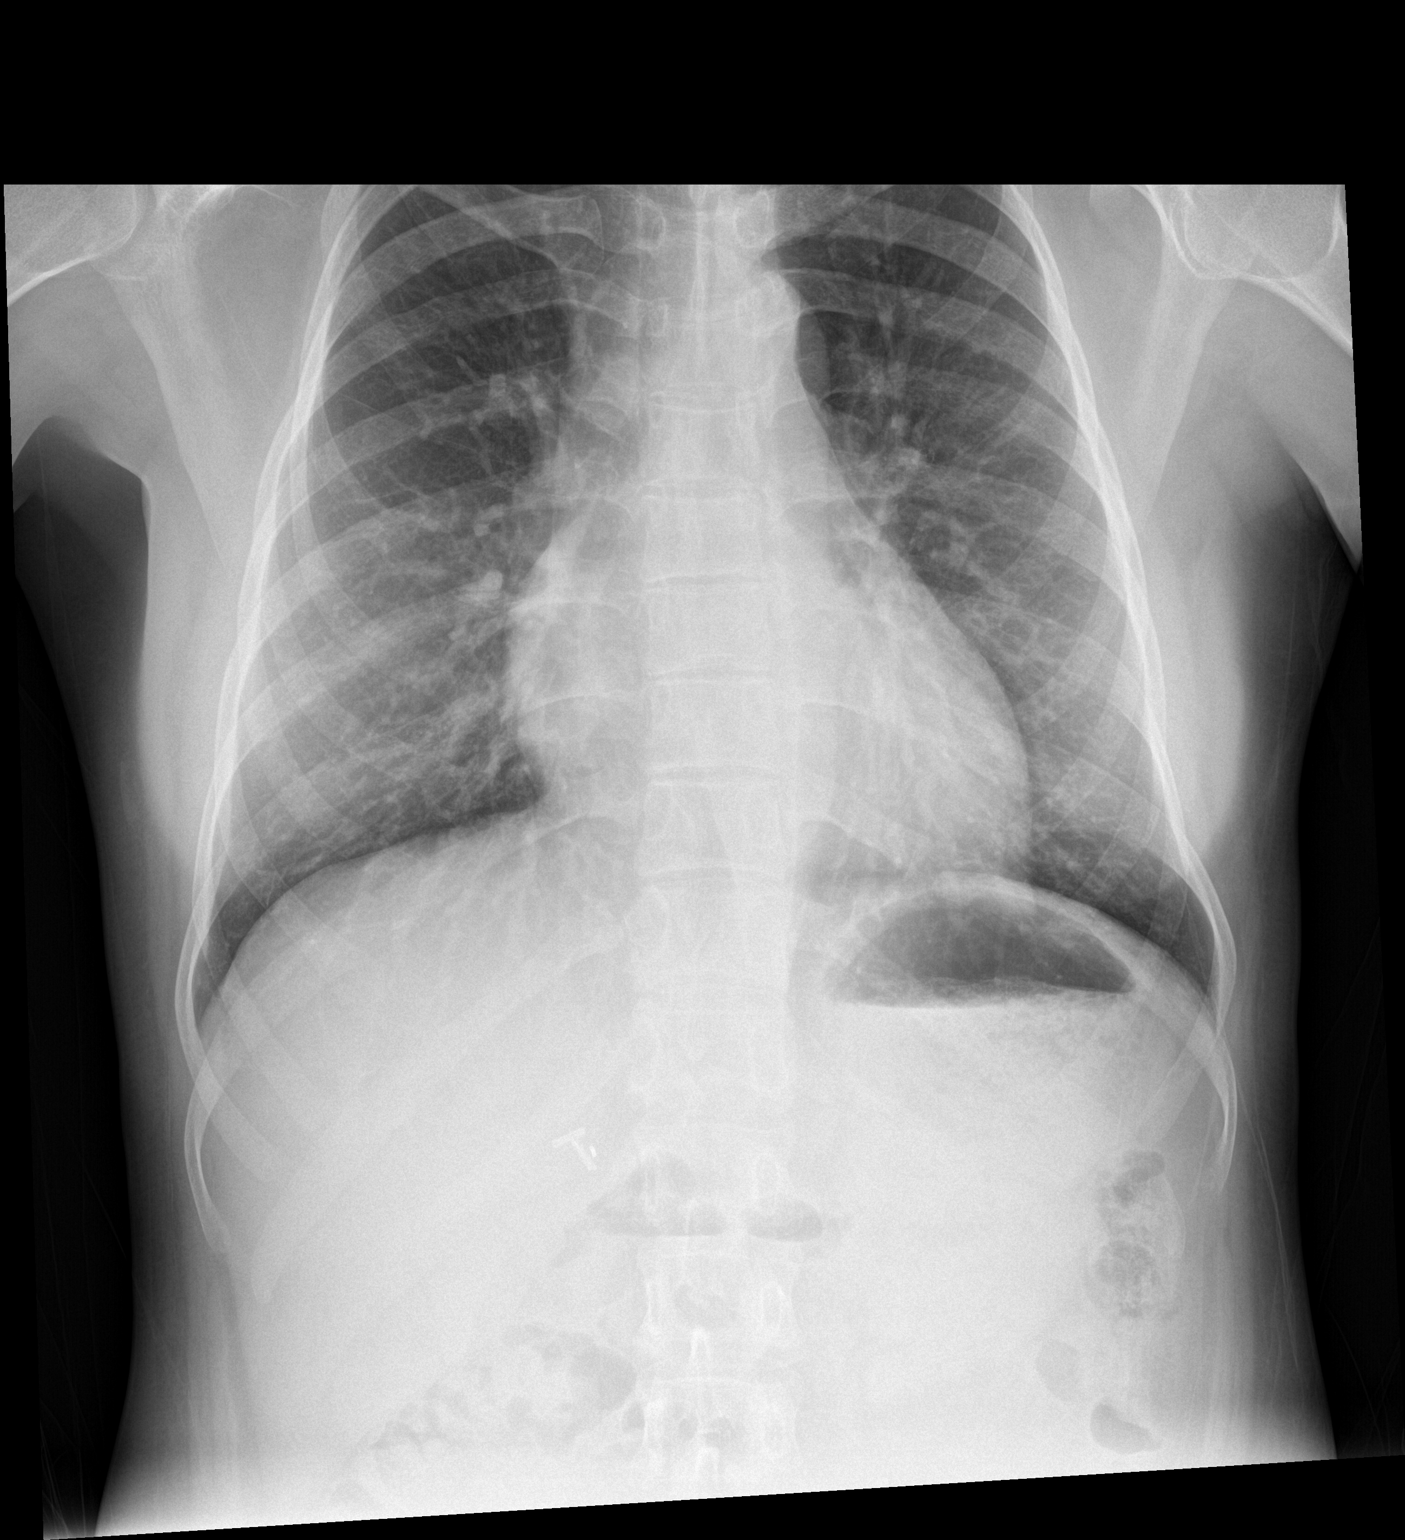

[chest lat]
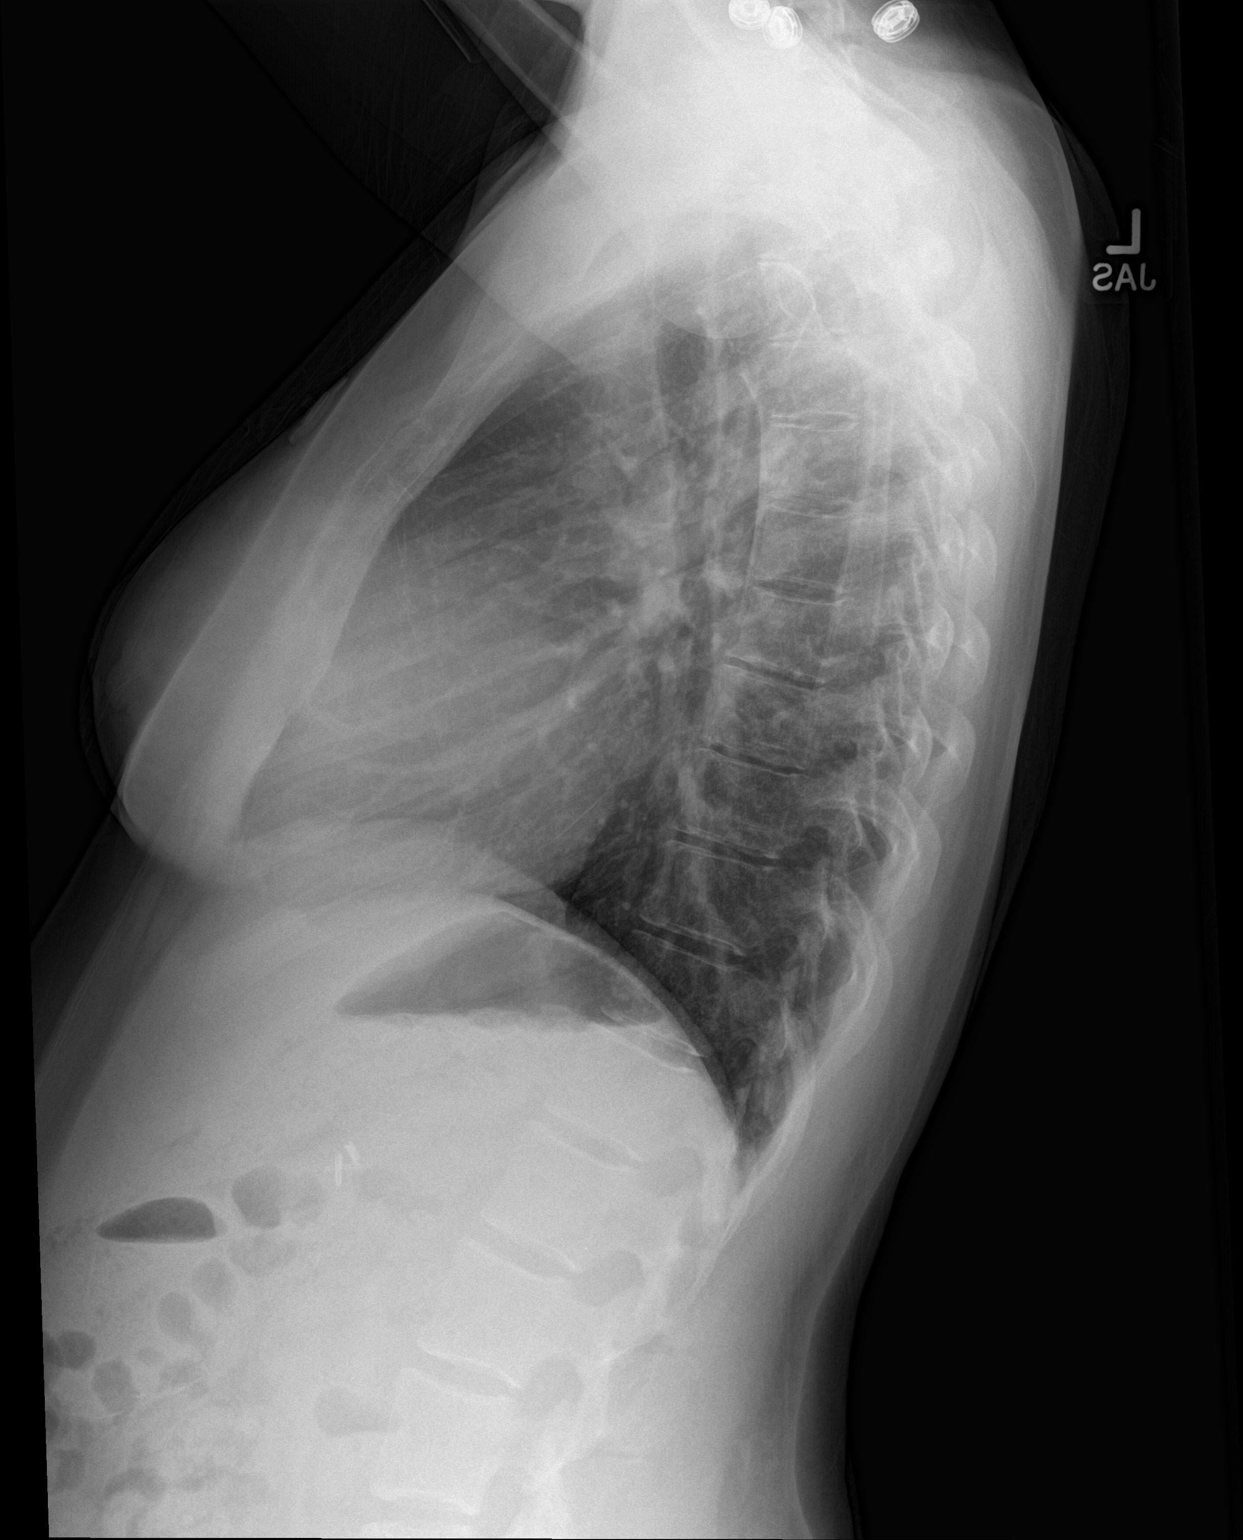

[2 of 2 positions shown; findings below may reference images not displayed]

FINDINGS: The heart size and mediastinal contours are within normal limits.
Both lungs are clear. No pneumothorax or pleural effusion is noted.
The visualized skeletal structures are unremarkable.
IMPRESSION: No active cardiopulmonary disease.

## 2016-03-26 ENCOUNTER — Inpatient Hospital Stay (HOSPITAL_COMMUNITY)
Admission: EM | Admit: 2016-03-26 | Discharge: 2016-03-30 | DRG: 812 | Disposition: A | Discharge status: Home / Self Care | Payer: Medicaid Other | Attending: Internal Medicine | Admitting: Internal Medicine

## 2016-03-26 ENCOUNTER — Encounter (HOSPITAL_COMMUNITY): Payer: Self-pay

## 2016-03-26 ENCOUNTER — Emergency Department (HOSPITAL_COMMUNITY): Payer: Self-pay

## 2016-03-26 DIAGNOSIS — E876 Hypokalemia: Secondary | ICD-10-CM

## 2016-03-26 DIAGNOSIS — Z833 Family history of diabetes mellitus: Secondary | ICD-10-CM

## 2016-03-26 DIAGNOSIS — R509 Fever, unspecified: Secondary | ICD-10-CM

## 2016-03-26 DIAGNOSIS — R102 Pelvic and perineal pain: Secondary | ICD-10-CM

## 2016-03-26 DIAGNOSIS — D5700 Hb-SS disease with crisis, unspecified: Principal | ICD-10-CM | POA: Diagnosis present

## 2016-03-26 DIAGNOSIS — D72829 Elevated white blood cell count, unspecified: Secondary | ICD-10-CM | POA: Diagnosis present

## 2016-03-26 DIAGNOSIS — R1013 Epigastric pain: Secondary | ICD-10-CM

## 2016-03-26 DIAGNOSIS — E860 Dehydration: Secondary | ICD-10-CM | POA: Diagnosis present

## 2016-03-26 DIAGNOSIS — R110 Nausea: Secondary | ICD-10-CM

## 2016-03-26 DIAGNOSIS — R52 Pain, unspecified: Secondary | ICD-10-CM

## 2016-03-26 DIAGNOSIS — R7989 Other specified abnormal findings of blood chemistry: Secondary | ICD-10-CM

## 2016-03-26 DIAGNOSIS — Z832 Family history of diseases of the blood and blood-forming organs and certain disorders involving the immune mechanism: Secondary | ICD-10-CM

## 2016-03-26 DIAGNOSIS — R945 Abnormal results of liver function studies: Secondary | ICD-10-CM

## 2016-03-26 LAB — CBC WITH DIFFERENTIAL/PLATELET
BASOS ABS: 0 10*3/uL (ref 0.0–0.1)
Basophils Relative: 0 %
EOS ABS: 0 10*3/uL (ref 0.0–0.7)
Eosinophils Relative: 0 %
HEMATOCRIT: 27.8 % — AB (ref 36.0–46.0)
Hemoglobin: 10 g/dL — ABNORMAL LOW (ref 12.0–15.0)
LYMPHS ABS: 0.9 10*3/uL (ref 0.7–4.0)
Lymphocytes Relative: 7 %
MCH: 39.8 pg — ABNORMAL HIGH (ref 26.0–34.0)
MCHC: 36 g/dL (ref 30.0–36.0)
MCV: 110.8 fL — ABNORMAL HIGH (ref 78.0–100.0)
MONO ABS: 0.4 10*3/uL (ref 0.1–1.0)
MONOS PCT: 3 %
NEUTROS ABS: 11.3 10*3/uL — AB (ref 1.7–7.7)
Neutrophils Relative %: 90 %
PLATELETS: 452 10*3/uL — AB (ref 150–400)
RBC: 2.51 MIL/uL — AB (ref 3.87–5.11)
RDW: 15 % (ref 11.5–15.5)
WBC: 12.6 10*3/uL — AB (ref 4.0–10.5)

## 2016-03-26 LAB — COMPREHENSIVE METABOLIC PANEL
ALBUMIN: 4.6 g/dL (ref 3.5–5.0)
ALK PHOS: 110 U/L (ref 38–126)
ALT: 61 U/L — ABNORMAL HIGH (ref 14–54)
AST: 67 U/L — AB (ref 15–41)
Anion gap: 9 (ref 5–15)
BILIRUBIN TOTAL: 5.9 mg/dL — AB (ref 0.3–1.2)
BUN: 9 mg/dL (ref 6–20)
CALCIUM: 9.1 mg/dL (ref 8.9–10.3)
CO2: 21 mmol/L — ABNORMAL LOW (ref 22–32)
Chloride: 103 mmol/L (ref 101–111)
GLUCOSE: 112 mg/dL — AB (ref 65–99)
POTASSIUM: 3.3 mmol/L — AB (ref 3.5–5.1)
Sodium: 133 mmol/L — ABNORMAL LOW (ref 135–145)
TOTAL PROTEIN: 8.4 g/dL — AB (ref 6.5–8.1)

## 2016-03-26 LAB — I-STAT BETA HCG BLOOD, ED (MC, WL, AP ONLY)

## 2016-03-26 LAB — RETICULOCYTES
RBC.: 2.51 MIL/uL — ABNORMAL LOW (ref 3.87–5.11)
RETIC CT PCT: 9.2 % — AB (ref 0.4–3.1)
Retic Count, Absolute: 230.9 10*3/uL — ABNORMAL HIGH (ref 19.0–186.0)

## 2016-03-26 LAB — LIPASE, BLOOD: LIPASE: 14 U/L (ref 11–51)

## 2016-03-26 LAB — I-STAT CG4 LACTIC ACID, ED: Lactic Acid, Venous: 1.06 mmol/L (ref 0.5–1.9)

## 2016-03-26 LAB — TROPONIN I: Troponin I: <0.03 ng/mL (ref <0.03)

## 2016-03-26 MED ORDER — SENNOSIDES-DOCUSATE SODIUM 8.6-50 MG PO TABS
1.0000 | ORAL_TABLET | Freq: Every evening | ORAL | Status: DC | PRN
  Administered 2016-03-28: 1 via ORAL
  Filled 2016-03-26: qty 1

## 2016-03-26 MED ORDER — KETOROLAC TROMETHAMINE 30 MG/ML IJ SOLN
30.0000 mg | INTRAMUSCULAR | Status: AC
  Administered 2016-03-26: 30 mg via INTRAVENOUS
  Filled 2016-03-26: qty 1

## 2016-03-26 MED ORDER — KETOROLAC TROMETHAMINE 30 MG/ML IJ SOLN
30.0000 mg | Freq: Four times a day (QID) | INTRAMUSCULAR | Status: DC
  Administered 2016-03-27 – 2016-03-29 (×9): 30 mg via INTRAVENOUS
  Filled 2016-03-26 (×9): qty 1

## 2016-03-26 MED ORDER — ACETAMINOPHEN 325 MG PO TABS
650.0000 mg | ORAL_TABLET | Freq: Four times a day (QID) | ORAL | Status: DC | PRN
  Administered 2016-03-26: 650 mg via ORAL
  Filled 2016-03-26: qty 2

## 2016-03-26 MED ORDER — HYDROXYUREA 500 MG PO CAPS: 1000.0000 mg | ORAL_CAPSULE | Freq: Every day | ORAL | Status: DC

## 2016-03-26 MED ORDER — OXYCODONE HCL 5 MG PO TABS
10.0000 mg | ORAL_TABLET | Freq: Four times a day (QID) | ORAL | Status: DC
  Administered 2016-03-27 – 2016-03-30 (×10): 10 mg via ORAL
  Filled 2016-03-26 (×12): qty 2

## 2016-03-26 MED ORDER — ENOXAPARIN SODIUM 40 MG/0.4ML ~~LOC~~ SOLN: 40.0000 mg | SUBCUTANEOUS | Status: DC

## 2016-03-26 MED ORDER — ONDANSETRON HCL 4 MG/2ML IJ SOLN
4.0000 mg | INTRAMUSCULAR | Status: DC | PRN
  Administered 2016-03-26: 4 mg via INTRAVENOUS
  Filled 2016-03-26: qty 2

## 2016-03-26 MED ORDER — DIPHENHYDRAMINE HCL 25 MG PO CAPS: 25.0000 mg | ORAL_CAPSULE | ORAL | Status: DC | PRN

## 2016-03-26 MED ORDER — ONDANSETRON HCL 4 MG PO TABS
4.0000 mg | ORAL_TABLET | Freq: Four times a day (QID) | ORAL | Status: DC | PRN
  Administered 2016-03-28: 4 mg via ORAL
  Filled 2016-03-26: qty 1

## 2016-03-26 MED ORDER — FOLIC ACID 1 MG PO TABS
1.0000 mg | ORAL_TABLET | Freq: Every day | ORAL | Status: DC
  Administered 2016-03-27 – 2016-03-30 (×4): 1 mg via ORAL
  Filled 2016-03-26 (×4): qty 1

## 2016-03-26 MED ORDER — HYDROMORPHONE HCL 2 MG/ML IJ SOLN
0.0313 mg/kg | Freq: Once | INTRAMUSCULAR | Status: AC
  Administered 2016-03-26: 1.6 mg via INTRAVENOUS
  Filled 2016-03-26: qty 1

## 2016-03-26 MED ORDER — DEXTROSE-NACL 5-0.45 % IV SOLN
INTRAVENOUS | Status: DC
  Administered 2016-03-26: 14:00:00 via INTRAVENOUS

## 2016-03-26 MED ORDER — DEXTROSE-NACL 5-0.45 % IV SOLN
INTRAVENOUS | Status: DC
  Administered 2016-03-26 – 2016-03-29 (×7): via INTRAVENOUS

## 2016-03-26 MED ORDER — DEXTROSE 5 % IV SOLN
1.0000 g | Freq: Once | INTRAVENOUS | Status: AC
  Administered 2016-03-26: 1 g via INTRAVENOUS
  Filled 2016-03-26: qty 10

## 2016-03-26 MED ORDER — ENOXAPARIN SODIUM 40 MG/0.4ML ~~LOC~~ SOLN
40.0000 mg | Freq: Every day | SUBCUTANEOUS | Status: DC
  Administered 2016-03-27 – 2016-03-29 (×3): 40 mg via SUBCUTANEOUS
  Filled 2016-03-26 (×4): qty 0.4

## 2016-03-26 MED ORDER — HYDROXYUREA 500 MG PO CAPS
1000.0000 mg | ORAL_CAPSULE | Freq: Every day | ORAL | Status: DC
  Administered 2016-03-26 – 2016-03-28 (×3): 1000 mg via ORAL
  Filled 2016-03-26 (×3): qty 2

## 2016-03-26 MED ORDER — SODIUM CHLORIDE 0.9 % IV BOLUS (SEPSIS)
1000.0000 mL | Freq: Once | INTRAVENOUS | Status: AC
  Administered 2016-03-26: 1000 mL via INTRAVENOUS

## 2016-03-26 MED ORDER — OXYCODONE HCL 5 MG PO TABS: 10.0000 mg | ORAL_TABLET | Freq: Once | ORAL | Status: DC

## 2016-03-26 MED ORDER — DIPHENHYDRAMINE HCL 25 MG PO CAPS
25.0000 mg | ORAL_CAPSULE | ORAL | Status: DC | PRN
Start: 2016-03-26 — End: 2016-03-26
  Administered 2016-03-26: 50 mg via ORAL
  Filled 2016-03-26: qty 2

## 2016-03-26 MED ORDER — HYDROMORPHONE HCL 2 MG/ML IJ SOLN
0.0250 mg/kg | Freq: Once | INTRAMUSCULAR | Status: AC
  Administered 2016-03-26: 1.2 mg via INTRAVENOUS
  Filled 2016-03-26: qty 1

## 2016-03-26 MED ORDER — HYDROMORPHONE HCL 2 MG/ML IJ SOLN
0.0375 mg/kg | Freq: Once | INTRAMUSCULAR | Status: AC
  Administered 2016-03-26: 1.9 mg via INTRAVENOUS
  Filled 2016-03-26: qty 1

## 2016-03-26 MED ORDER — HYDROMORPHONE HCL 1 MG/ML IJ SOLN
1.0000 mg | INTRAMUSCULAR | Status: DC | PRN
  Administered 2016-03-26 – 2016-03-30 (×13): 1 mg via INTRAVENOUS
  Filled 2016-03-26 (×13): qty 1

## 2016-03-26 MED ORDER — ONDANSETRON HCL 4 MG/2ML IJ SOLN: 4.0000 mg | Freq: Four times a day (QID) | INTRAMUSCULAR | Status: DC | PRN

## 2016-03-26 NOTE — ED Notes (Signed)
FIRST ATTEMPT TO CALL REPORT TO 1342-1.

## 2016-03-26 NOTE — Progress Notes (Signed)
Note from 08/29/2015 states baby passed away in NICU. Pharmacy informed d/t hydrea medication.

## 2016-03-26 NOTE — H&P (Signed)
Hospital Admission Note Date: 03/26/2016  Patient name: Gabriella Griffin record number: [id number] Date of birth: 1991/05/24 Age: 25 y.o. Gender: female PCP: No primary care provider on file.  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 She states that the pain has been occurring for a few days and she finally too a dose of Dilaudid yesterday. She however care to the ED as she was feeling feverish and weak. In the ED she was treated for the pain and reports that she feels that the pain is controlled. She reports that were it not for the fever she would be able to manage her pain at home. However she feels weak and reports decreased and dark urine output. She was found to be febrile in the ED. A CXR was performed and had no cardiopulmonary findings. A urinalysis was ordered but patient has been unable to give a sample so far. She was given a dose of Rocephin and blood cultures taken I am asked to admit her for fever in patient with Hb SS.  Scheduled Meds:  Continuous Infusions: . dextrose 5 % and 0.45% NaCl 125 mL/hr at 03/26/16 1355   PRN Meds:.diphenhydrAMINE, ondansetron Allergies: Ceftin [cefuroxime axetil] Past Medical History:  Diagnosis Date  . Sickle cell anemia (HCC)   . Sickle cell anemia ([place]) 1996   Past Surgical History:  Procedure Laterality Date  . CESAREAN SECTION N/A 08/25/2015   Procedure: CESAREAN SECTION;  Surgeon: Gabriella Bellman, MD;  Location: [facility];  Service: Obstetrics;  Laterality: N/A;  . CHOLECYSTECTOMY  2007   Family History  Problem Relation Age of Onset  . Sickle cell anemia Sister   . Diabetes Paternal Grandmother   . Sickle cell trait Mother   . Sickle cell trait Father    Social History   Social History  . Marital status: Single    Spouse name: N/A  . Number  of children: N/A  . Years of education: N/A   Occupational History  . Not on file.   Social History Main Topics  . Smoking status: Never Smoker  . Smokeless tobacco: Never Used  . Alcohol use No  . Drug use: No  . Sexual activity: Yes    Birth control/ protection: None   Other Topics Concern  . Not on file   Social History Narrative  . No narrative on file   Review of Systems: Pertinent items noted in HPI and remainder of comprehensive ROS otherwise negative. Physical Exam: No intake or output data in the 24 hours ending 03/26/16 1545 General: Alert, awake, oriented x3, in no acute distress.  HEENT: [place]/AT PEERL, EOMI Neck: Trachea midline,  no masses, no thyromegal,y no JVD, no carotid bruit OROPHARYNX:  Mucosa dry and tachy, No exudate/ erythema/lesions.  Heart: Regular rate and rhythm, without murmurs, rubs, gallops, PMI non-displaced, no heaves or thrills on palpation.  Lungs: Clear to auscultation, no wheezing or rhonchi noted. No increased vocal fremitus resonant to percussion  Abdomen: Soft, nontender, nondistended, positive bowel sounds, no masses no hepatosplenomegaly noted..  Neuro: No focal neurological deficits noted cranial nerves II through XII grossly intact. Strength at functional baseline in bilateral upper and lower extremities. Musculoskeletal: No warmth swelling or erythema around joints, no spinal tenderness noted. Psychiatric: Patient alert and oriented x3, good insight and cognition, good  recent to remote recall.   Lab results:  Recent Labs  03/26/16 1354  NA 133*  K 3.3*  CL 103  CO2 21*  GLUCOSE 112*  BUN 9  CREATININE <0.30*  CALCIUM 9.1    Recent Labs  03/26/16 1354  AST 67*  ALT 61*  ALKPHOS 110  BILITOT 5.9*  PROT 8.4*  ALBUMIN 4.6    Recent Labs  03/26/16 1354  LIPASE 14    Recent Labs  03/26/16 1354  WBC 12.6*  NEUTROABS 11.3*  HGB 10.0*  HCT 27.8*  MCV 110.8*  PLT 452*    Recent Labs  03/26/16 1354   TROPONINI <0.03   Invalid input(s): POCBNP No results for input(s): DDIMER in the last 72 hours. No results for input(s): HGBA1C in the last 72 hours. No results for input(s): CHOL, HDL, LDLCALC, TRIG, CHOLHDL, LDLDIRECT in the last 72 hours. No results for input(s): TSH, T4TOTAL, T3FREE, THYROIDAB in the last 72 hours.  Invalid input(s): FREET3  Recent Labs  03/26/16 1354  RETICCTPCT 9.2*   Imaging results:  Dg Chest 2 View  Result Date: 03/26/2016 CLINICAL DATA:  Sickle cell crisis with chest pain. EXAM: CHEST  2 VIEW COMPARISON:  11/20/2015 FINDINGS: Borderline cardiac enlargement is stable. The mediastinal and hilar contours are normal. The lungs are clear. No pleural effusion. The bony thorax is intact. IMPRESSION: No acute cardiopulmonary findings. Electronically Signed   By: Marijo Sanes M.D.   On: 03/26/2016 14:45      Assessment and Plan:  Fever:  Pt presents with fever and weakness. This could be a feature of he vaso-occlusive crisis but ma represent an occult infection. Will bring in on an observation basis. Hold on antibiotics and observe and await cultures and urinalysis  Dehydration: Pt presenting with mild dehydration. Will gently hydrate.   Hb SS with crisis: Pt is opiate naive and feels that her pain is manageable with oral analgesics. She has been prescribed Dilaudid as an out patient and has not used any for several months until yesterday. She feels that she has better results with control of pain with Oxycodone. Will treat with Oxycodone 10 mg q 6 hours, Toradol, IVF and IV Dilaudid for breakthrough pain.    Anemia of Chronic Disease: He Hb is currently at baseline.   Hypokalemia: Pt has mild hypokalemia. Will replace orally.  DVT prophylaxis: Lovenox  F/E/N: Hypotonic IVF, regular diet  Gabriella Griffin A. 03/26/2016, 3:45 PM

## 2016-03-26 NOTE — ED Notes (Signed)
Patient ambulated to restroom, but was unable to give sample after several minutes

## 2016-03-26 NOTE — ED Provider Notes (Signed)
[facility] DEPT Provider Note   CSN: [id number] Arrival date & time: 03/26/16  1152  First Provider Contact:  First MD Initiated Contact with Patient 03/26/16 1228        History   Chief Complaint Chief Complaint  Patient presents with  . Sickle Cell Pain Crisis    HPI Gabriella Griffin is a 25 y.o. female with a PMHx of sickle cell anemia and a PSHx of cholecystectomy, who presents to the ED with complaints of sickle cell crisis pain that began last night. She states that this pain is similar to prior crises, describing her pain is 10/10 constant aching sharp and sore generalized pain throughout her entire body, worse with cold exposure, and unumproved with home Dilaudid of which she is unsure of the dosage. Associated symptoms include chills, nausea, and epigastric abdominal pain that she attributes to the fact that she has not eaten. She also reports that she feels like she has a fever, and upon arrival her temperature is 100.6. She had not taken her temperature prior to arrival.  She denies any cough, ear pain or drainage, sore throat, rhinorrhea, neck stiffness or pain, chest pain, shortness of breath, leg swelling, leg ulcerations, vomiting, diarrhea, constipation, obstipation, melena, hematochezia, dysuria, hematuria, vaginal bleeding or discharge, numbness, tingling, focal weakness, or rashes/skin changes. She denies any recent travel or sick contacts. LMP 03/09/16.  Of note, she was hospitalized in December 2016-January 2017 for sickle cell pain/delivery of infant at [redacted]w[redacted]d, had pre-eclampsia, delivered her infant via C-section, baby ultimately and unfortunately passed away of GNR sepsis. She was admitted several days after (08/27/15) that for ongoing pain in her abdomen and low-grade fever, they treated her for presumed endometritis at that time, and she was discharged several days later on 08/29/15.    The history is provided by the patient and medical records. No language  interpreter was used.  Sickle Cell Pain Crisis  Location:  Diffuse Severity:  Moderate Onset quality:  Gradual Duration:  1 day Similar to previous crisis episodes: yes   Timing:  Constant Progression:  Unchanged Chronicity:  Recurrent History of pulmonary emboli: no   Relieved by:  Nothing Exacerbated by: cold exposure. Ineffective treatments:  Prescription drugs Associated symptoms: fever (100.6 Tmax) and nausea   Associated symptoms: no chest pain, no cough, no leg ulcers, no shortness of breath, no sore throat, no swelling of legs and no vomiting   Risk factors: cholecystectomy   Risk factors: no frequent admissions for pain, no frequent pain crises and no recent air travel     Past Medical History:  Diagnosis Date  . Sickle cell anemia (HCC)   . Sickle cell anemia ([place]) 1996    Patient Active Problem List   Diagnosis Date Noted  . Abdominal pain 08/27/2015  . Preeclampsia, severe 08/21/2015  . Hb-SS disease with crisis ([place]) 08/14/2015  . Sickle cell anemia with pain ([place]) 07/13/2015  . Sickle cell crisis ([place]) 06/11/2015  . 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 Leukocytosis 06/11/2015  . Hereditary disease in family possibly affecting fetus, affecting management of mother, antepartum condition or complication [id number]  . Supervision of high-risk pregnancy 02/04/2015  . Maternal sickle cell anemia complicating pregnancy ([place]) 02/04/2015    Past Surgical History:  Procedure Laterality Date  . CESAREAN SECTION N/A 08/25/2015   Procedure: CESAREAN SECTION;  Surgeon: [name], MD;  Location: [facility];  Service: Obstetrics;  Laterality: N/A;  . CHOLECYSTECTOMY  2007    OB History    Gravida Para Term Preterm AB Living   2 1 1     1    SAB TAB Ectopic Multiple Live Births         0 1       Home Medications    Prior to Admission medications   Medication Sig Start Date End Date Taking?  Authorizing Provider  folic acid (FOLVITE) 1 MG tablet Take 2 tablets (2 mg total) by mouth daily. Patient taking differently: Take 1 mg by mouth daily.  06/16/15  Yes [name], MD  HYDROmorphone (DILAUDID) 2 MG tablet Take 1 tablet (2 mg total) by mouth every 4 (four) hours as needed for severe pain. 08/27/15  Yes Woodroe Mode, MD  hydroxyurea (HYDREA) 500 MG capsule Take 1 capsule (500 mg total) by mouth daily. May take with food to minimize GI side effects. Patient taking differently: Take 1,000 mg by mouth daily. May take with food to minimize GI side effects. 08/29/15  Yes Seabron Spates, CNM  ibuprofen (ADVIL,MOTRIN) 600 MG tablet Take 1 tablet (600 mg total) by mouth every 6 (six) hours. Patient taking differently: Take 600 mg by mouth every 6 (six) hours as needed for moderate pain.  08/27/15  Yes Woodroe Mode, MD  oxyCODONE (OXY IR/ROXICODONE) 5 MG immediate release tablet Take 5-10 mg by mouth every 6 (six) hours as needed. pain 07/24/15  Yes Historical Provider, MD    Family History Family History  Problem Relation Age of Onset  . Sickle cell anemia Sister   . Diabetes Paternal Grandmother   . Sickle cell trait Mother   . Sickle cell trait Father     Social History Social History  Substance Use Topics  . Smoking status: Never Smoker  . Smokeless tobacco: Never Used  . Alcohol use No     Allergies   Ceftin [cefuroxime axetil]   Review of Systems Review of Systems  Constitutional: Positive for chills and fever (100.6 Tmax).  HENT: Negative for ear discharge, ear pain, rhinorrhea and sore throat.   Respiratory: Negative for cough and shortness of breath.   Cardiovascular: Negative for chest pain and leg swelling.  Gastrointestinal: Positive for abdominal pain and nausea. Negative for anal bleeding, blood in stool, constipation, diarrhea and vomiting.  Genitourinary: Negative for dysuria, hematuria, vaginal bleeding and vaginal discharge.  Musculoskeletal:  Positive for myalgias (generalized). Negative for arthralgias.  Skin: Negative for rash.  Allergic/Immunologic: Negative for immunocompromised state.  Neurological: Negative for weakness and numbness.  Psychiatric/Behavioral: Negative for confusion.   10 Systems reviewed and are negative for acute change except as noted in the HPI.   Physical Exam Updated Vital Signs BP 108/68 (BP Location: Left Arm)   Pulse 107   Temp 100.6 F (38.1 C) (Oral)   Resp 16   Ht 5\' 2"  (1.575 m)   Wt 49.9 kg   LMP 03/09/2016   SpO2 99%   BMI 20.12 kg/m   Physical Exam  Constitutional: She is oriented to person, place, and time. She appears well-developed and well-nourished.  Non-toxic appearance. No distress.  Low-grade temp 100.6 oral, nontoxic, NAD  HENT:  Head: Normocephalic and atraumatic.  Nose: Nose normal.  Mouth/Throat: Uvula is midline, oropharynx is clear and moist and mucous membranes are normal. No trismus in the jaw. No uvula swelling. No tonsillar exudate.  Nose clear. Oropharynx clear and moist, without uvular swelling or deviation, no trismus or drooling, no tonsillar swelling  or erythema, no exudates.   Eyes: Conjunctivae and EOM are normal. Right eye exhibits no discharge. Left eye exhibits no discharge. Scleral icterus is present.  Scleral icterus  Neck: Normal range of motion. Neck supple. No Brudzinski's sign and no Kernig's sign noted.  No meningismus, FROM intact  Cardiovascular: Regular rhythm, normal heart sounds and intact distal pulses.  Tachycardia present.  Exam reveals no gallop and no friction rub.   No murmur heard. Mildly tachycardic in the 100s, reg rhythm, nl s1/s2, no m/r/g, distal pulses intact, no pedal edema   Pulmonary/Chest: Effort normal and breath sounds normal. No respiratory distress. She has no decreased breath sounds. She has no wheezes. She has no rhonchi. She has no rales.  CTAB in all lung fields, no w/r/r, no hypoxia or increased WOB, speaking in  full sentences, SpO2 99% on RA   Abdominal: Soft. Normal appearance and bowel sounds are normal. She exhibits no distension. There is tenderness in the epigastric area. There is no rigidity, no rebound, no guarding, no CVA tenderness, no tenderness at McBurney's point and negative Murphy's sign.  Soft, nondistended, +BS throughout, with minimal epigastric TTP, no r/g/r, neg murphy's, neg mcburney's, no CVA TTP   Musculoskeletal: Normal range of motion.  MAE x4 Strength and sensation grossly intact Distal pulses intact No pedal edema  Neurological: She is alert and oriented to person, place, and time. She has normal strength. No sensory deficit.  Skin: Skin is warm, dry and intact. No rash noted.  No rashes  Psychiatric: She has a normal mood and affect.  Nursing note and vitals reviewed.    ED Treatments / Results  Labs (all labs ordered are listed, but only abnormal results are displayed) Labs Reviewed  COMPREHENSIVE METABOLIC PANEL - Abnormal; Notable for the following:       Result Value   Sodium 133 (*)    Potassium 3.3 (*)    CO2 21 (*)    Glucose, Bld 112 (*)    Creatinine, Ser <0.30 (*)    Total Protein 8.4 (*)    AST 67 (*)    ALT 61 (*)    Total Bilirubin 5.9 (*)    All other components within normal limits  CBC WITH DIFFERENTIAL/PLATELET - Abnormal; Notable for the following:    WBC 12.6 (*)    RBC 2.51 (*)    Hemoglobin 10.0 (*)    HCT 27.8 (*)    MCV 110.8 (*)    MCH 39.8 (*)    Platelets 452 (*)    Neutro Abs 11.3 (*)    All other components within normal limits  RETICULOCYTES - Abnormal; Notable for the following:    Retic Ct Pct 9.2 (*)    RBC. 2.51 (*)    Retic Count, Manual 230.9 (*)    All other components within normal limits  CULTURE, BLOOD (ROUTINE X 2)  CULTURE, BLOOD (ROUTINE X 2)  URINE CULTURE  LIPASE, BLOOD  TROPONIN I  URINALYSIS, ROUTINE W REFLEX MICROSCOPIC (NOT AT [facility])  I-STAT CG4 LACTIC ACID, ED  I-STAT BETA HCG BLOOD, ED ([facility], [facility],  [facility] ONLY)    EKG  EKG Interpretation  Date/Time:  Friday March 26 2016 14:20:50 EDT Ventricular Rate:  102 PR Interval:    QRS Duration: 92 QT Interval:  346 QTC Calculation: 451 R Axis:   43 Text Interpretation:  Sinus tachycardia Nonspecific T wave abnormality, worse in diffuse leads Otherwise no significant change Confirmed by KNOTT MD, DANIEL [id number]) on  03/26/2016 2:29:11 PM       Radiology Dg Chest 2 View  Result Date: 03/26/2016 CLINICAL DATA:  Sickle cell crisis with chest pain. EXAM: CHEST  2 VIEW COMPARISON:  11/20/2015 FINDINGS: Borderline cardiac enlargement is stable. The mediastinal and hilar contours are normal. The lungs are clear. No pleural effusion. The bony thorax is intact. IMPRESSION: No acute cardiopulmonary findings. Electronically Signed   By: Marijo Sanes M.D.   On: 03/26/2016 14:45    Procedures Procedures (including critical care time)  Medications Ordered in ED Medications  dextrose 5 %-0.45 % sodium chloride infusion ( Intravenous New Bag/Given 03/26/16 1355)  ondansetron (ZOFRAN) injection 4 mg (4 mg Intravenous Given 03/26/16 1355)  diphenhydrAMINE (BENADRYL) capsule 25-50 mg (50 mg Oral Given 03/26/16 1322)  ketorolac (TORADOL) 30 MG/ML injection 30 mg (30 mg Intravenous Given 03/26/16 1440)  HYDROmorphone (DILAUDID) injection 1.2 mg (1.2 mg Intravenous Given 03/26/16 1355)    Followed by  HYDROmorphone (DILAUDID) injection 1.6 mg (1.6 mg Intravenous Given 03/26/16 1440)    Followed by  HYDROmorphone (DILAUDID) injection 1.9 mg (1.9 mg Intravenous Given 03/26/16 1543)  cefTRIAXone (ROCEPHIN) 1 g in dextrose 5 % 50 mL IVPB (0 g Intravenous Stopped 03/26/16 1437)     Initial Impression / Assessment and Plan / ED Course  I have reviewed the triage vital signs and the nursing notes.  Pertinent labs & imaging results that were available during my care of the patient were reviewed by me and considered in my medical decision making (see chart for  details).  Clinical Course    25 y.o. female here with sickle cell pain and fever that began last night. Has some nausea and epigastric abd pain that she states feels like it's because she hasn't eaten. Temp 100.6, HR minimally elevated at 107, but overall pt well appearing, does not appear septic, and has no obvious source of infection based on symptoms. On exam, minimal epigastric abd tenderness, nonperitoneal. Neg murphy's, neg mcburney's. Clear lung exam. No meningismus, no focal neuro findings. Skin clear of infectious findings. ENT exam benign. Unclear what her source of fever is. Given lack of suspicion for infection, will hold off on calling code sepsis, but will get sepsis work up labs/cultures, and retics. Will give fluids based off sickle cell order set fluids, and toradol/dilaudid/zofran/benadryl. Will reassess shortly. Doubt need for imaging at this time.   1:12 PM Discussed case with my attending Dr. Laneta Simmers who will see pt, would like CXR and rocephin added on, just in case this epigastric pain is an early acute chest symptom, but otherwise he agrees with remainder of plan.   2:47 PM  Lactic WNL which is reassuring that this is not septic picture. HCG neg. CMP with Na mildly low at 133, K mildly low at 3.3 doubt need for repletion separate from just fluids; minimally elevated AST/ALT 67/61 respectively, bili 5.9. Retics elevated which is reassuring. Lipase WNL. CBC w/diff showing minimally elevated WBC 12.6, Hgb 10 which is better than prior, plt 452 similar to prior values. EKG with some nonspecific T changes seen previously but somewhat more pronounced this time, no other acute ischemic findings. CXR neg. Will add on troponin. U/A still pending. Will reassess shortly. Of note, Dr. Zigmund Daniel called regarding this patient, stated that she will come see the patient, that this pt typically gets fever with her sickle cell crises and that she should NOT need rocephin for empiric coverage.  Unfortunately the rocephin has already been given, but won't  order any further abx. Dr. Zigmund Daniel stated she will come by and see pt as well.  3:50 PM Trop WNL. U/A not yet done, but Dr. Zigmund Daniel down to admit the patient. Please see her notes for further documentation of care/dispo. I appreciate her help with this patient's care! Pt stable at this time.   Final Clinical Impressions(s) / ED Diagnoses   Final diagnoses:  Sickle cell anemia with crisis (HCC)  Leukocytosis  Hypokalemia  Elevated LFTs  Other specified fever  Elevated platelet count (HCC)  Generalized pain  Epigastric abdominal pain  Nausea    New Prescriptions New Prescriptions   No medications on file     Zacarias Pontes, PA-C 03/26/16 1551    Leo Grosser, MD 03/27/16 0006

## 2016-03-26 NOTE — ED Notes (Signed)
WILL TRANSPORT PT TO THE FLOOR. IVF INFUSING W/O PAIN OR SWELLING. THE OPPORTUNITY TO ASK QUESTIONS WAS PROVIDED.

## 2016-03-26 NOTE — ED Triage Notes (Signed)
Per EMS, pt from home, reports gen pain since last night.  Normal pain when having crisis.  Has taken pain med without relief.

## 2016-03-26 NOTE — ED Notes (Signed)
Patient transported to X-ray 

## 2016-03-26 NOTE — ED Notes (Signed)
Patient informed a urine sample is neeed, unable to give at this time,.

## 2016-03-26 NOTE — Progress Notes (Signed)
Pt noted without a pcp Pt speaks and understands English clearly Pt very pleasant She states she was raised in [place] [facility]) Moved to [place] 2 years ago and had recently been in [place] for "about three months"  Pt states she was seeing her Hematologist at [facility] Dr [facility]enton Pare every 3 months Pt listed in [facility] without insurance at this time   CM discussed and provided written information to assist pt with determining choice for uninsured accepting pcps, discussed the importance of pcp vs EDP services for f/u care, www.needymeds.org, www.goodrx.com, discounted pharmacies and other [employer] such as [employer] [facility] [employer], affordable care act, financial assistance, uninsured dental services, [facility] med assist, DSS and  health department  Reviewed resources for [employer] uninsured accepting pcps like [facility], family medicine at [employer], [facility], [facility], local urgent care centers, [facility], [facility] family practice, general medical clinics, family services of the [place], [facility] urgent care plus others, medication resources, [facility] and housing Pt voiced understanding and appreciation of resources provided   Provided [facility] contact information Pt agreed to a referral Cm completed referral Pt to be contact by [facility] clinical liaison CM discuss the availability of [facility] for pcp and sickle cell anemia care as needed Pt appreciative of resource

## 2016-03-27 LAB — BASIC METABOLIC PANEL
Anion gap: 7 (ref 5–15)
BUN: 12 mg/dL (ref 6–20)
CALCIUM: 8.2 mg/dL — AB (ref 8.9–10.3)
CO2: 24 mmol/L (ref 22–32)
CREATININE: 0.49 mg/dL (ref 0.44–1.00)
Chloride: 105 mmol/L (ref 101–111)
GFR calc Af Amer: >60 mL/min (ref >60)
GFR calc non Af Amer: >60 mL/min (ref >60)
GLUCOSE: 121 mg/dL — AB (ref 65–99)
Potassium: 3.1 mmol/L — ABNORMAL LOW (ref 3.5–5.1)
Sodium: 136 mmol/L (ref 135–145)

## 2016-03-27 MED ORDER — POTASSIUM CHLORIDE CRYS ER 20 MEQ PO TBCR
40.0000 meq | EXTENDED_RELEASE_TABLET | Freq: Once | ORAL | Status: AC
  Administered 2016-03-27: 40 meq via ORAL
  Filled 2016-03-27: qty 2

## 2016-03-27 NOTE — Progress Notes (Signed)
Subjective: A 25 yo admitted with fever in the setting of sickle cell painful crisis. Patient is to have a low-grade temperature. The source of her fever is unknown. The pain is being controlled with oral medications without problem. She denied any nausea or vomiting. Patient has right lower quadrant abdominal  Pain with tenderness.  Objective: Vital signs in last 24 hours: Temp:  [97.8 F (36.6 C)-103 F (39.4 C)] 97.8 F (36.6 C) (08/12 0451) Pulse Rate:  [65-107] 65 (08/12 0451) Resp:  [14-28] 14 (08/12 0451) BP: (90-111)/(51-72) 98/72 (08/12 0640) SpO2:  [97 %-100 %] 100 % (08/12 0451) Weight:  [49.9 kg (110 lb)-50 kg (110 lb 4.8 oz)] 50 kg (110 lb 4.8 oz) (08/12 0451) Weight change:  Last BM Date: 03/25/16  Intake/Output from previous day: 08/11 0701 - 08/12 0700 In: 2533.5 [P.O.:640; I.V.:1872.7; IV Piggyback:20.8] Out: -  Intake/Output this shift: No intake/output data recorded.  General appearance: alert, cooperative and no distress Eyes: conjunctivae/corneas clear. PERRL, EOM's intact. Fundi benign. Ears: normal TM's and external ear canals both ears Neck: no adenopathy, no carotid bruit, no JVD, supple, symmetrical, trachea midline and thyroid not enlarged, symmetric, no tenderness/mass/nodules Back: symmetric, no curvature. ROM normal. No CVA tenderness. Resp: clear to auscultation bilaterally Chest wall: no tenderness Cardio: regular rate and rhythm, S1, S2 normal, no murmur, click, rub or gallop GI: soft, non-tender; bowel sounds normal; no masses,  no organomegaly Extremities: extremities normal, atraumatic, no cyanosis or edema Pulses: 2+ and symmetric Skin: Skin color, texture, turgor normal. No rashes or lesions Neurologic: Grossly normal  Lab Results:  Recent Labs  03/26/16 1354  WBC 12.6*  HGB 10.0*  HCT 27.8*  PLT 452*   BMET  Recent Labs  03/26/16 1354 03/27/16 0515  NA 133* 136  K 3.3* 3.1*  CL 103 105  CO2 21* 24  GLUCOSE 112* 121*   BUN 9 12  CREATININE <0.30* 0.49  CALCIUM 9.1 8.2*    Studies/Results: Dg Chest 2 View  Result Date: 03/26/2016 CLINICAL DATA:  Sickle cell crisis with chest pain. EXAM: CHEST  2 VIEW COMPARISON:  11/20/2015 FINDINGS: Borderline cardiac enlargement is stable. The mediastinal and hilar contours are normal. The lungs are clear. No pleural effusion. The bony thorax is intact. IMPRESSION: No acute cardiopulmonary findings. Electronically Signed   By: Marijo Sanes M.D.   On: 03/26/2016 14:45    Medications: I have reviewed the patient's current medications.  Assessment/Plan: A 25 year old female admitted with fever  As well as sickle cell painful crisis.  Fever:  Pt presents with fever and weakness. No source identified. Fever curve decreasing. Suspect this is due to Vaso-occulusive crisis.   Dehydration: Pt presenting with mild dehydration. Will continue to  hydrate.   Hb SS with crisis: Pt is opiate naive and feels that her pain is manageable with oral analgesics. Will continue to treat with Oxycodone 10 mg q 6 hours, Toradol, IVF and IV Dilaudid for breakthrough pain.    Anemia of Chronic Disease: He Hb is currently at baseline.   Hypokalemia: Pt has mild hypokalemia. Will replace orally.  DVT prophylaxis: Lovenox  F/E/N: Hypotonic IVF, regular diet   LOS: 0 days   Tarick Parenteau,LAWAL 03/27/2016, 7:59 AM

## 2016-03-28 LAB — COMPREHENSIVE METABOLIC PANEL
ALK PHOS: 117 U/L (ref 38–126)
ALT: 183 U/L — AB (ref 14–54)
AST: 214 U/L — ABNORMAL HIGH (ref 15–41)
Albumin: 3.3 g/dL — ABNORMAL LOW (ref 3.5–5.0)
Anion gap: 3 — ABNORMAL LOW (ref 5–15)
BILIRUBIN TOTAL: 4.9 mg/dL — AB (ref 0.3–1.2)
BUN: 6 mg/dL (ref 6–20)
CALCIUM: 8.7 mg/dL — AB (ref 8.9–10.3)
CO2: 25 mmol/L (ref 22–32)
CREATININE: 0.31 mg/dL — AB (ref 0.44–1.00)
Chloride: 109 mmol/L (ref 101–111)
Glucose, Bld: 109 mg/dL — ABNORMAL HIGH (ref 65–99)
Potassium: 3.5 mmol/L (ref 3.5–5.1)
Sodium: 137 mmol/L (ref 135–145)
TOTAL PROTEIN: 7.1 g/dL (ref 6.5–8.1)

## 2016-03-28 NOTE — Progress Notes (Signed)
Subjective: Patient feeling better today with no fever. Still has RLQ pain and tenderness. She had history of Pelvic infection in the past and will need to rule it out once more. Sickle cell pain continues to get better with current regimen. Pain now is at 5/10. No NVD, no diarrhea, no discharge.   Objective: Vital signs in last 24 hours: Temp:  [98.1 F (36.7 C)-99.5 F (37.5 C)] 99.4 F (37.4 C) (08/13 1409) Pulse Rate:  [69-83] 83 (08/13 1409) Resp:  [16] 16 (08/13 1409) BP: (88-110)/(47-68) 95/60 (08/13 1409) SpO2:  [93 %-100 %] 100 % (08/13 1409) Weight change:  Last BM Date: 03/25/16  Intake/Output from previous day: 08/12 0701 - 08/13 0700 In: 2797.5 [P.O.:360; I.V.:2437.5] Out: 1250 [Urine:1250] Intake/Output this shift: Total I/O In: 706 [P.O.:120; I.V.:586] Out: 400 [Urine:400]  General appearance: alert, cooperative and no distress Eyes: conjunctivae/corneas clear. PERRL, EOM's intact. Fundi benign. Ears: normal TM's and external ear canals both ears Neck: no adenopathy, no carotid bruit, no JVD, supple, symmetrical, trachea midline and thyroid not enlarged, symmetric, no tenderness/mass/nodules Back: symmetric, no curvature. ROM normal. No CVA tenderness. Resp: clear to auscultation bilaterally Chest wall: no tenderness Cardio: regular rate and rhythm, S1, S2 normal, no murmur, click, rub or gallop GI: soft, non-tender; bowel sounds normal; no masses,  no organomegaly Extremities: extremities normal, atraumatic, no cyanosis or edema Pulses: 2+ and symmetric Skin: Skin color, texture, turgor normal. No rashes or lesions Neurologic: Grossly normal  Lab Results:  Recent Labs  03/26/16 1354  WBC 12.6*  HGB 10.0*  HCT 27.8*  PLT 452*   BMET  Recent Labs  03/27/16 0515 03/28/16 0808  NA 136 137  K 3.1* 3.5  CL 105 109  CO2 24 25  GLUCOSE 121* 109*  BUN 12 6  CREATININE 0.49 0.31*  CALCIUM 8.2* 8.7*    Studies/Results: No results  found.  Medications: I have reviewed the patient's current medications.  Assessment/Plan: A 25 year old female admitted with fever  As well as sickle cell painful crisis.  Fever:   Fever curve decreasing. Suspect this is due to Vaso-occulusive crisis. I will however check Pelvic ultrasound to rule out pelvic infection as a source.   Dehydration: Pt presenting with mild dehydration. Will continue to  hydrate.   Hb SS with crisis: Pt is opiate naive and feels that her pain is manageable with oral analgesics. Will continue to treat with Oxycodone 10 mg q 6 hours, Toradol, IVF and IV Dilaudid for breakthrough pain.    Anemia of Chronic Disease: He Hb is currently at baseline.   Hypokalemia: Pt has mild hypokalemia. Will replace orally.  DVT prophylaxis: Lovenox  F/E/N: Hypotonic IVF, regular diet   LOS: 0 days   Antoin Dargis,LAWAL 03/28/2016, 3:34 PM

## 2016-03-29 LAB — COMPREHENSIVE METABOLIC PANEL
ALBUMIN: 3.4 g/dL — AB (ref 3.5–5.0)
ALK PHOS: 119 U/L (ref 38–126)
ALT: 196 U/L — ABNORMAL HIGH (ref 14–54)
ANION GAP: 5 (ref 5–15)
AST: 220 U/L — ABNORMAL HIGH (ref 15–41)
BUN: <5 mg/dL — ABNORMAL LOW (ref 6–20)
CALCIUM: 8.3 mg/dL — AB (ref 8.9–10.3)
CHLORIDE: 110 mmol/L (ref 101–111)
CO2: 26 mmol/L (ref 22–32)
Creatinine, Ser: 0.36 mg/dL — ABNORMAL LOW (ref 0.44–1.00)
GFR calc non Af Amer: >60 mL/min (ref >60)
Glucose, Bld: 104 mg/dL — ABNORMAL HIGH (ref 65–99)
POTASSIUM: 3.1 mmol/L — AB (ref 3.5–5.1)
SODIUM: 141 mmol/L (ref 135–145)
Total Bilirubin: 2.8 mg/dL — ABNORMAL HIGH (ref 0.3–1.2)
Total Protein: 6.7 g/dL (ref 6.5–8.1)

## 2016-03-29 LAB — CBC WITH DIFFERENTIAL/PLATELET
BASOS ABS: 0 10*3/uL (ref 0.0–0.1)
BASOS PCT: 0 %
EOS ABS: 0.1 10*3/uL (ref 0.0–0.7)
Eosinophils Relative: 2 %
HCT: 21.3 % — ABNORMAL LOW (ref 36.0–46.0)
Hemoglobin: 7.6 g/dL — ABNORMAL LOW (ref 12.0–15.0)
LYMPHS ABS: 2.4 10*3/uL (ref 0.7–4.0)
Lymphocytes Relative: 64 %
MCH: 38.2 pg — AB (ref 26.0–34.0)
MCHC: 35.7 g/dL (ref 30.0–36.0)
MCV: 107 fL — ABNORMAL HIGH (ref 78.0–100.0)
MONO ABS: 0.4 10*3/uL (ref 0.1–1.0)
Monocytes Relative: 9 %
NEUTROS ABS: 1 10*3/uL — AB (ref 1.7–7.7)
Neutrophils Relative %: 25 %
PLATELETS: 342 10*3/uL (ref 150–400)
RBC: 1.99 MIL/uL — ABNORMAL LOW (ref 3.87–5.11)
RDW: 14.6 % (ref 11.5–15.5)
WBC: 3.9 10*3/uL — ABNORMAL LOW (ref 4.0–10.5)

## 2016-03-29 NOTE — Progress Notes (Signed)
Subjective: Patient feeling better today with no fever. Still has RLQ pain and tenderness. Awaiting pelvic US. She had history of Pelvic infection in the past and will need to rule it out once more. Sickle cell pain continues to get better with current regimen. Pain now is at 3/10. No NVD, no diarrhea, no discharge.   Objective: Vital signs in last 24 hours: Temp:  [98.4 F (36.9 C)-99.2 F (37.3 C)] 99 F (37.2 C) (08/14 1327) Pulse Rate:  [66-84] 84 (08/14 1327) Resp:  [16-18] 18 (08/14 1327) BP: (92-117)/(59-83) 117/83 (08/14 1327) SpO2:  [96 %-100 %] 98 % (08/14 1327) Weight change:  Last BM Date: 03/25/16  Intake/Output from previous day: 08/13 0701 - 08/14 0700 In: 3435.7 [P.O.:480; I.V.:2955.7] Out: 400 [Urine:400] Intake/Output this shift: Total I/O In: 532 [I.V.:532] Out: -   General appearance: alert, cooperative and no distress Eyes: conjunctivae/corneas clear. PERRL, EOM's intact. Fundi benign. Ears: normal TM's and external ear canals both ears Neck: no adenopathy, no carotid bruit, no JVD, supple, symmetrical, trachea midline and thyroid not enlarged, symmetric, no tenderness/mass/nodules Back: symmetric, no curvature. ROM normal. No CVA tenderness. Resp: clear to auscultation bilaterally Chest wall: no tenderness Cardio: regular rate and rhythm, S1, S2 normal, no murmur, click, rub or gallop GI: soft, non-tender; bowel sounds normal; no masses,  no organomegaly Extremities: extremities normal, atraumatic, no cyanosis or edema Pulses: 2+ and symmetric Skin: Skin color, texture, turgor normal. No rashes or lesions Neurologic: Grossly normal  Lab Results:  Recent Labs  03/29/16 0440  WBC 3.9*  HGB 7.6*  HCT 21.3*  PLT 342   BMET  Recent Labs  03/28/16 0808 03/29/16 0440  NA 137 141  K 3.5 3.1*  CL 109 110  CO2 25 26  GLUCOSE 109* 104*  BUN 6 <5*  CREATININE 0.31* 0.36*  CALCIUM 8.7* 8.3*    Studies/Results: No results  found.  Medications: I have reviewed the patient's current medications.  Assessment/Plan: A 25 year old female admitted with fever  As well as sickle cell painful crisis.  Fever:   Resolved. Awaiting Pelvic ultrasound to rule out pelvic infection as a source.   Dehydration: Pt presenting with mild dehydration. Will continue to  hydrate.   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 Will continue to treat with Oxycodone 10 mg q 6 hours, Toradol, IVF and IV Dilaudid for breakthrough pain.    Anemia of Chronic Disease: He Hb is currently at baseline.   Hypokalemia: Pt has mild hypokalemia. Will replace orally.  DVT prophylaxis: Lovenox  F/E/N: Hypotonic IVF, regular diet   LOS: 1 day   Debbie Yearick,LAWAL 03/29/2016, 5:57 PM

## 2016-03-29 NOTE — Progress Notes (Signed)
PHARMACY BRIEF NOTE: HYDROXYUREA   By [facility] policy, hydroxyurea is automatically held when any of the following laboratory values occur:  ANC < 2 K Pltc < 80K in sickle-cell patients; < 100K in other patients  Hgb <= 6 in sickle-cell patients; < 8 in other patients  Reticulocytes < 80K when Hgb < 9  Hydroxyurea has been held (discontinued from profile) per policy.    Reuel Boom, PharmD, BCPS Pager: [phone] 03/29/2016, 7:44 AM

## 2016-03-30 ENCOUNTER — Inpatient Hospital Stay (HOSPITAL_COMMUNITY): Payer: Self-pay

## 2016-03-30 DIAGNOSIS — R102 Pelvic and perineal pain: Secondary | ICD-10-CM

## 2016-03-30 DIAGNOSIS — R509 Fever, unspecified: Secondary | ICD-10-CM | POA: Diagnosis not present

## 2016-03-30 DIAGNOSIS — D5700 Hb-SS disease with crisis, unspecified: Secondary | ICD-10-CM | POA: Diagnosis not present

## 2016-03-30 DIAGNOSIS — D72829 Elevated white blood cell count, unspecified: Secondary | ICD-10-CM | POA: Diagnosis not present

## 2016-03-30 LAB — URINALYSIS, ROUTINE W REFLEX MICROSCOPIC
BILIRUBIN URINE: NEGATIVE
GLUCOSE, UA: NEGATIVE mg/dL
HGB URINE DIPSTICK: NEGATIVE
KETONES UR: NEGATIVE mg/dL
Nitrite: NEGATIVE
PROTEIN: NEGATIVE mg/dL
Specific Gravity, Urine: 1.007 (ref 1.005–1.030)
pH: 6.5 (ref 5.0–8.0)

## 2016-03-30 LAB — URINE MICROSCOPIC-ADD ON: RBC / HPF: NONE SEEN RBC/hpf (ref 0–5)

## 2016-03-30 NOTE — Progress Notes (Signed)
Patient requested to leave IV out at this time. Possible discharge home today.

## 2016-03-30 NOTE — Discharge Summary (Signed)
Physician Discharge Summary  Gabriella Griffin [id number] DOB: 04-22-1991 DOA: 03/26/2016  PCP: No primary care provider on file.  Admit date: 03/26/2016  Discharge date: 03/30/2016  Discharge Diagnoses:  Active Problems:   Fever  Discharge Condition: Stable  Disposition: Discharged home in a stable condition.  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 She states that the pain has been occurring for a few days and she finally took a dose of Dilaudid yesterday. She however came to the ED as she was feeling feverish and weak. In the ED she was treated for the pain and reports that she feels that the pain is controlled. She reports that were it not for the fever she would be able to manage her pain at home. However she feels weak and reports decreased and dark urine output. She was found to be febrile in the ED. A CXR was performed and had no cardiopulmonary findings. A urinalysis was ordered but patient has been unable to give a sample so far. She was given a dose of Rocephin and blood cultures taken. Admitted for fever in a patient with Hb SS.  Hospital Course:  Patient was admitted for fever, dehydration and sickle cell crisis. Blood and urine cultures were negatives, she had mildly elevated liver enzymes without hepatomegaly or RUQ pain or jjaundice. She slowly improved, leucocytosis resolved, pain controlled, fever resolved. Pelvic ultrasound ordered based on complaint of pelvic pain, it was normal. No N/V/D. Patient requested to be discharged because she claimed she feels much improved. Tolerating po intake, ambulating well without restrictions. Her fever was taken to be due to [facility]. It resolved. She will follow up with her PCP  in [facility] to monitor her liver enzyme. She was discharged in a hemodynamically stable condition.   Discharge Exam: Vitals:   03/29/16 2142 03/30/16 0615  BP: 109/73 120/84  Pulse: 85 72  Resp: 16 16  Temp: 99.2 F (37.3 C) 98 F (36.7 C)   Vitals:   03/29/16 1327 03/29/16 1804 03/29/16 2142 03/30/16 0615  BP: 117/83 114/68 109/73 120/84  Pulse: 84 75 85 72  Resp: 18 16 16 16   Temp: 99 F (37.2 C) 98.9 F (37.2 C) 99.2 F (37.3 C) 98 F (36.7 C)  TempSrc: Oral Oral Oral Oral  SpO2: 98% 100% 100%   Weight:   122 lb 9.2 oz (55.6 kg)   Height:       General appearance : Awake, alert, not in any distress. Speech Clear. Not toxic looking HEENT: Atraumatic and Normocephalic, pupils equally reactive to light and accomodation Neck: Supple, no JVD. No cervical lymphadenopathy.  Chest: Good air entry bilaterally, no added sounds  CVS: S1 S2 regular, no murmurs.  Abdomen: Bowel sounds present, Non tender and not distended with no gaurding, rigidity or rebound. Extremities: B/L Lower Ext shows no edema, both legs are warm to touch Neurology: Awake alert, and oriented X 3, CN II-XII intact, Non focal Skin: No Rash  Discharge Instructions  Discharge Instructions    Call MD for:  temperature >100.4    Complete by:  As directed   Diet - low sodium heart healthy    Complete  by:  As directed   Increase activity slowly    Complete by:  As directed       Medication List    TAKE these medications   folic acid 1 MG tablet Commonly known as:  FOLVITE Take 2 tablets (2 mg total) by mouth daily. What changed:  how much to take   HYDROmorphone 2 MG tablet Commonly known as:  DILAUDID Take 1 tablet (2 mg total) by mouth every 4 (four) hours as needed for severe pain.   hydroxyurea 500 MG capsule Commonly known as:  HYDREA Take 1 capsule (500 mg total) by mouth daily. May take with food to minimize GI side effects. What changed:  how much to take  additional instructions    ibuprofen 600 MG tablet Commonly known as:  ADVIL,MOTRIN Take 1 tablet (600 mg total) by mouth every 6 (six) hours. What changed:  when to take this  reasons to take this   oxyCODONE 5 MG immediate release tablet Commonly known as:  Oxy IR/ROXICODONE Take 5-10 mg by mouth every 6 (six) hours as needed. pain        The results of significant diagnostics from this hospitalization (including imaging, microbiology, ancillary and laboratory) are listed below for reference.    Significant Diagnostic Studies: Dg Chest 2 View  Result Date: 03/26/2016 CLINICAL DATA:  Sickle cell crisis with chest pain. EXAM: CHEST  2 VIEW COMPARISON:  11/20/2015 FINDINGS: Borderline cardiac enlargement is stable. The mediastinal and hilar contours are normal. The lungs are clear. No pleural effusion. The bony thorax is intact. IMPRESSION: No acute cardiopulmonary findings. Electronically Signed   By: Marijo Sanes M.D.   On: 03/26/2016 14:45   US Pelvis Complete  Result Date: 03/30/2016 CLINICAL DATA:  Acute onset of pelvic pain.  Initial encounter. EXAM: TRANSABDOMINAL ULTRASOUND OF PELVIS TECHNIQUE: Transabdominal ultrasound examination of the pelvis was performed including evaluation of the uterus, ovaries, adnexal regions, and pelvic cul-de-sac. COMPARISON:  None. FINDINGS: Uterus Measurements: 5.8 x 3.0 x 4.0 cm. No fibroids or other mass visualized. Endometrium Thickness: 0.5 cm.  No focal abnormality visualized. Right ovary Measurements: 3.4 x 1.7 x 2.6 cm. Normal appearance/no adnexal mass. Left ovary Measurements: 3.3 x 1.8 x 3.2 cm. Normal appearance/no adnexal mass. Other findings:  No free fluid is seen within the pelvic cul-de-sac. IMPRESSION: Unremarkable pelvic ultrasound.  No evidence for ovarian torsion. Electronically Signed   By: Garald Balding M.D.   On: 03/30/2016 03:00    Microbiology: Recent Results (from the past 240 hour(s))  Blood Culture (routine x 2)     Status: None (Preliminary  result)   Collection Time: 03/26/16  1:17 PM  Result Value Ref Range Status   Specimen Description BLOOD RIGHT ANTECUBITAL  Final   Special Requests IN PEDIATRIC BOTTLE [place]  Final   Culture   Final    NO GROWTH 3 DAYS Performed at [facility]    Report Status PENDING  Incomplete  Blood Culture (routine x 2)     Status: None (Preliminary result)   Collection Time: 03/26/16  1:41 PM  Result Value Ref Range Status   Specimen Description RIGHT ANTECUBITAL  Final   Special Requests IN PEDIATRIC BOTTLE [place]  Final   Culture   Final    NO GROWTH 3 DAYS Performed at [facility]    Report Status PENDING  Incomplete     Labs: Basic Metabolic Panel:  Recent Labs Lab 03/26/16 1354 03/27/16 0515 03/28/16 [id number] 03/29/16 0440  NA 133* 136 137 141  K 3.3* 3.1* 3.5 3.1*  CL 103 105 109 110  CO2 21* 24 25 26   GLUCOSE 112* 121* 109* 104*  BUN 9 12 6  <5*  CREATININE <0.30* 0.49 0.31* 0.36*  CALCIUM 9.1 8.2* 8.7* 8.3*   Liver Function Tests:  Recent Labs Lab 03/26/16 1354 03/28/16 0808 03/29/16 0440  AST 67* 214* 220*  ALT 61* 183* 196*  ALKPHOS 110 117 119  BILITOT 5.9* 4.9* 2.8*  PROT 8.4* 7.1 6.7  ALBUMIN 4.6 3.3* 3.4*    Recent Labs Lab 03/26/16 1354  LIPASE 14   No results for input(s): AMMONIA in the last 168 hours. CBC:  Recent Labs Lab 03/26/16 1354 03/29/16 0440  WBC 12.6* 3.9*  NEUTROABS 11.3* 1.0*  HGB 10.0* 7.6*  HCT 27.8* 21.3*  MCV 110.8* 107.0*  PLT 452* 342   Cardiac Enzymes:  Recent Labs Lab 03/26/16 1354  TROPONINI <0.03    Time coordinating discharge: 50 minutes  Signed:  Angelica Chessman  [facility] 03/30/2016, 2:15 PM

## 2016-03-31 LAB — URINE CULTURE

## 2016-03-31 LAB — CULTURE, BLOOD (ROUTINE X 2)
Culture: NO GROWTH
Culture: NO GROWTH

## 2016-06-13 ENCOUNTER — Emergency Department (HOSPITAL_COMMUNITY)
Admission: EM | Admit: 2016-06-13 | Discharge: 2016-06-14 | Disposition: A | Discharge status: Home / Self Care | Payer: Self-pay | Attending: Emergency Medicine | Admitting: Emergency Medicine

## 2016-06-13 ENCOUNTER — Encounter (HOSPITAL_COMMUNITY): Payer: Self-pay | Admitting: Emergency Medicine

## 2016-06-13 DIAGNOSIS — Z79899 Other long term (current) drug therapy: Secondary | ICD-10-CM | POA: Insufficient documentation

## 2016-06-13 DIAGNOSIS — D5700 Hb-SS disease with crisis, unspecified: Secondary | ICD-10-CM | POA: Insufficient documentation

## 2016-06-13 LAB — URINALYSIS, ROUTINE W REFLEX MICROSCOPIC
Bilirubin Urine: NEGATIVE
Glucose, UA: NEGATIVE mg/dL
Ketones, ur: NEGATIVE mg/dL
Leukocytes, UA: NEGATIVE
NITRITE: NEGATIVE
PROTEIN: 30 mg/dL — AB
SPECIFIC GRAVITY, URINE: 1.012 (ref 1.005–1.030)
pH: 6.5 (ref 5.0–8.0)

## 2016-06-13 LAB — COMPREHENSIVE METABOLIC PANEL
ALT: 24 U/L (ref 14–54)
ANION GAP: 6 (ref 5–15)
AST: 46 U/L — ABNORMAL HIGH (ref 15–41)
Albumin: 4.5 g/dL (ref 3.5–5.0)
Alkaline Phosphatase: 91 U/L (ref 38–126)
BILIRUBIN TOTAL: 3.4 mg/dL — AB (ref 0.3–1.2)
BUN: 6 mg/dL (ref 6–20)
CHLORIDE: 110 mmol/L (ref 101–111)
CO2: 26 mmol/L (ref 22–32)
Calcium: 8.9 mg/dL (ref 8.9–10.3)
Creatinine, Ser: 0.43 mg/dL — ABNORMAL LOW (ref 0.44–1.00)
Glucose, Bld: 95 mg/dL (ref 65–99)
POTASSIUM: 3.9 mmol/L (ref 3.5–5.1)
Sodium: 142 mmol/L (ref 135–145)
TOTAL PROTEIN: 7.7 g/dL (ref 6.5–8.1)

## 2016-06-13 LAB — URINE MICROSCOPIC-ADD ON
BACTERIA UA: NONE SEEN
WBC, UA: NONE SEEN WBC/hpf (ref 0–5)

## 2016-06-13 LAB — CBC WITH DIFFERENTIAL/PLATELET
BASOS ABS: 0 10*3/uL (ref 0.0–0.1)
BASOS PCT: 1 %
EOS PCT: 1 %
Eosinophils Absolute: 0 10*3/uL (ref 0.0–0.7)
HEMATOCRIT: 24.3 % — AB (ref 36.0–46.0)
Hemoglobin: 8.8 g/dL — ABNORMAL LOW (ref 12.0–15.0)
LYMPHS PCT: 32 %
Lymphs Abs: 2.1 10*3/uL (ref 0.7–4.0)
MCH: 40.2 pg — ABNORMAL HIGH (ref 26.0–34.0)
MCHC: 36.2 g/dL — AB (ref 30.0–36.0)
MCV: 111 fL — ABNORMAL HIGH (ref 78.0–100.0)
Monocytes Absolute: 0.6 10*3/uL (ref 0.1–1.0)
Monocytes Relative: 9 %
Neutro Abs: 3.8 10*3/uL (ref 1.7–7.7)
Neutrophils Relative %: 59 %
PLATELETS: 334 10*3/uL (ref 150–400)
RBC: 2.19 MIL/uL — AB (ref 3.87–5.11)
RDW: 18.7 % — AB (ref 11.5–15.5)
WBC: 6.6 10*3/uL (ref 4.0–10.5)

## 2016-06-13 LAB — RETICULOCYTES
RBC.: 2.19 MIL/uL — AB (ref 3.87–5.11)
RETIC COUNT ABSOLUTE: 210.2 10*3/uL — AB (ref 19.0–186.0)
RETIC CT PCT: 9.6 % — AB (ref 0.4–3.1)

## 2016-06-13 LAB — POC URINE PREG, ED: PREG TEST UR: NEGATIVE

## 2016-06-13 MED ORDER — HYDROMORPHONE HCL 1 MG/ML IJ SOLN: 0.5000 mg | INTRAMUSCULAR | Status: AC

## 2016-06-13 MED ORDER — HYDROMORPHONE HCL 1 MG/ML IJ SOLN
0.5000 mg | INTRAMUSCULAR | Status: AC
Start: 2016-06-13 — End: 2016-06-13
  Administered 2016-06-13: 1 mg via INTRAVENOUS

## 2016-06-13 MED ORDER — HYDROMORPHONE HCL 1 MG/ML IJ SOLN
0.5000 mg | INTRAMUSCULAR | Status: DC
  Administered 2016-06-13: 1 mg via INTRAVENOUS
  Filled 2016-06-13: qty 1

## 2016-06-13 MED ORDER — ONDANSETRON HCL 4 MG/2ML IJ SOLN
4.0000 mg | INTRAMUSCULAR | Status: DC | PRN
  Administered 2016-06-13: 4 mg via INTRAVENOUS
  Filled 2016-06-13: qty 2

## 2016-06-13 MED ORDER — HYDROMORPHONE HCL 1 MG/ML IJ SOLN: 0.5000 mg | INTRAMUSCULAR | Status: DC

## 2016-06-13 MED ORDER — SODIUM CHLORIDE 0.45 % IV SOLN
INTRAVENOUS | Status: DC
  Administered 2016-06-13: 21:00:00 via INTRAVENOUS

## 2016-06-13 MED ORDER — KETOROLAC TROMETHAMINE 15 MG/ML IJ SOLN
15.0000 mg | Freq: Once | INTRAMUSCULAR | Status: AC
  Administered 2016-06-13: 15 mg via INTRAVENOUS
  Filled 2016-06-13: qty 1

## 2016-06-13 MED ORDER — HYDROMORPHONE HCL 1 MG/ML IJ SOLN
0.5000 mg | INTRAMUSCULAR | Status: AC
  Administered 2016-06-13: 1 mg via INTRAVENOUS
  Filled 2016-06-13: qty 1

## 2016-06-13 MED ORDER — HYDROMORPHONE HCL 1 MG/ML IJ SOLN
0.5000 mg | INTRAMUSCULAR | Status: AC
  Filled 2016-06-13 (×2): qty 1

## 2016-06-13 NOTE — ED Triage Notes (Signed)
Brought in by EMS from home with c/o sickle cell pain crisis.  Pt reports progressive pain to both legs and lower back---- pt reports Rx for pain meds not helping.

## 2016-06-13 NOTE — ED Notes (Signed)
Bed: [id number] Expected date:  Expected time:  Means of arrival:  Comments: 26 yo F/ SCC

## 2016-06-13 NOTE — ED Triage Notes (Signed)
Pt is c/o pain to bilateral LE described as sharp and in her lower back 8/10. Pt states that she has not taken any medication for management.

## 2016-06-13 NOTE — ED Provider Notes (Signed)
[place] DEPT Provider Note   CSN: [id number] Arrival date & time: 06/13/16  2018     History   Chief Complaint Chief Complaint  Patient presents with  . Sickle Cell Pain Crisis    HPI Gabriella Griffin is a 25 y.o. female.  The history is provided by the patient. No language interpreter was used.  Sickle Cell Pain Crisis    Gabriella Griffin is a 25 y.o. female who presents to the Emergency Department complaining of sickle cell pain crisis.  She reports bilateral upper leg and low back pain that started about 4:30 this afternoon while at work. Pain is typical for her sickle cell pain crises. She denies any fevers, chest pain, shortness of breath, nausea, vomiting, abdominal pain, dysuria. She has Dilaudid available at home as needed for pain but did not take it because the pain became so intense that quickly. He does not often need pain medications at home and has not taken any for over a month. When the weather turns cold she often has pain flares and she was in the rain on Friday and feels that maybe contributing to her pain that began today.  Past Medical History:  Diagnosis Date  . Sickle cell anemia (HCC)   . Sickle cell anemia ([place]) 1996    Patient Active Problem List   Diagnosis Date Noted  . Female pelvic pain   . Fever 03/26/2016  . Abdominal pain 08/27/2015  . Preeclampsia, severe 08/21/2015  . Sickle cell anemia with crisis ([place]) 08/14/2015  . Sickle cell anemia with pain ([place]) 07/13/2015  . Sickle cell crisis ([place]) 06/11/2015  . 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 Leukocytosis 06/11/2015  . Hereditary disease in family possibly affecting fetus, affecting management of mother, antepartum condition or complication [id number]  . Supervision of high-risk pregnancy 02/04/2015  . Maternal sickle cell anemia complicating pregnancy ([place]) 02/04/2015    Past Surgical History:  Procedure  Laterality Date  . CESAREAN SECTION N/A 08/25/2015   Procedure: CESAREAN SECTION;  Surgeon: [name], MD;  Location: [facility];  Service: Obstetrics;  Laterality: N/A;  . CHOLECYSTECTOMY  2007    OB History    Gravida Para Term Preterm AB Living   2 1 1     1    SAB TAB Ectopic Multiple Live Births         0 1       Home Medications    Prior to Admission medications   Medication Sig Start Date End Date Taking? Authorizing Provider  folic acid (FOLVITE) 1 MG tablet Take 2 tablets (2 mg total) by mouth daily. Patient taking differently: Take 1 mg by mouth daily.  06/16/15  Yes [name], MD  HYDROmorphone (DILAUDID) 2 MG tablet Take 1 tablet (2 mg total) by mouth every 4 (four) hours as needed for severe pain. 08/27/15  Yes Woodroe Mode, MD  hydroxyurea (HYDREA) 500 MG capsule Take 1 capsule (500 mg total) by mouth daily. May take with food to minimize GI side effects. Patient taking differently: Take 1,000 mg by mouth daily. May take with food to minimize GI side effects. 08/29/15  Yes Seabron Spates, CNM  ibuprofen (ADVIL,MOTRIN) 600 MG tablet Take 1 tablet (600 mg total) by mouth every 6 (six) hours. Patient taking differently: Take 600 mg by mouth every 6 (six) hours as needed for moderate pain.  08/27/15  Yes Woodroe Mode, MD  oxyCODONE (ROXICODONE)  5 MG immediate release tablet Take 1 tablet (5 mg total) by mouth every 4 (four) hours as needed for severe pain. 06/14/16   Quintella Reichert, MD    Family History Family History  Problem Relation Age of Onset  . Sickle cell anemia Sister   . Diabetes Paternal Grandmother   . Sickle cell trait Mother   . Sickle cell trait Father     Social History Social History  Substance Use Topics  . Smoking status: Never Smoker  . Smokeless tobacco: Never Used  . Alcohol use No     Allergies   Ceftin [cefuroxime axetil]   Review of Systems Review of Systems  All other systems reviewed and are negative.    Physical  Exam Updated Vital Signs BP 104/67 (BP Location: Right Arm)   Pulse 84   Temp 98 F (36.7 C) (Oral)   Resp 15   Ht 5\' 2"  (1.575 m)   Wt 115 lb (52.2 kg)   LMP 03/12/2016   SpO2 97%   BMI 21.03 kg/m   Physical Exam  Constitutional: She is oriented to person, place, and time. She appears well-developed and well-nourished.  HENT:  Head: Normocephalic and atraumatic.  Cardiovascular: Normal rate and regular rhythm.   No murmur heard. Pulmonary/Chest: Effort normal and breath sounds normal. No respiratory distress.  Abdominal: Soft. There is no tenderness. There is no rebound and no guarding.  Musculoskeletal: She exhibits no edema or tenderness.  Neurological: She is alert and oriented to person, place, and time.  Skin: Skin is warm and dry.  Psychiatric: She has a normal mood and affect. Her behavior is normal.  Nursing note and vitals reviewed.    ED Treatments / Results  Labs (all labs ordered are listed, but only abnormal results are displayed) Labs Reviewed  CBC WITH DIFFERENTIAL/PLATELET - Abnormal; Notable for the following:       Result Value   RBC 2.19 (*)    Hemoglobin 8.8 (*)    HCT 24.3 (*)    MCV 111.0 (*)    MCH 40.2 (*)    MCHC 36.2 (*)    RDW 18.7 (*)    All other components within normal limits  RETICULOCYTES - Abnormal; Notable for the following:    Retic Ct Pct 9.6 (*)    RBC. 2.19 (*)    Retic Count, Manual 210.2 (*)    All other components within normal limits  COMPREHENSIVE METABOLIC PANEL - Abnormal; Notable for the following:    Creatinine, Ser 0.43 (*)    AST 46 (*)    Total Bilirubin 3.4 (*)    All other components within normal limits  URINALYSIS, ROUTINE W REFLEX MICROSCOPIC (NOT AT [facility]) - Abnormal; Notable for the following:    APPearance CLOUDY (*)    Hgb urine dipstick MODERATE (*)    Protein, ur 30 (*)    All other components within normal limits  URINE MICROSCOPIC-ADD ON - Abnormal; Notable for the following:    Squamous  Epithelial / LPF 0-5 (*)    All other components within normal limits  POC URINE PREG, ED    EKG  EKG Interpretation None       Radiology No results found.  Procedures Procedures (including critical care time)  Medications Ordered in ED Medications  0.45 % sodium chloride infusion ( Intravenous New Bag/Given 06/13/16 2102)  HYDROmorphone (DILAUDID) injection 0.5-1 mg (not administered)    Or  HYDROmorphone (DILAUDID) injection 0.5-1 mg (not administered)  HYDROmorphone (  DILAUDID) injection 0.5-1 mg (1 mg Intravenous Given 06/13/16 2305)    Or  HYDROmorphone (DILAUDID) injection 0.5-1 mg ( Subcutaneous See Alternative 06/13/16 2305)  ondansetron (ZOFRAN) injection 4 mg (4 mg Intravenous Given 06/13/16 2103)  HYDROmorphone (DILAUDID) injection 0.5-1 mg (1 mg Intravenous Given 06/13/16 2103)    Or  HYDROmorphone (DILAUDID) injection 0.5-1 mg ( Subcutaneous See Alternative 06/13/16 2103)  HYDROmorphone (DILAUDID) injection 0.5-1 mg (1 mg Intravenous Given 06/13/16 2202)    Or  HYDROmorphone (DILAUDID) injection 0.5-1 mg ( Subcutaneous See Alternative 06/13/16 2202)  ketorolac (TORADOL) 15 MG/ML injection 15 mg (15 mg Intravenous Given 06/13/16 2347)     Initial Impression / Assessment and Plan / ED Course  I have reviewed the triage vital signs and the nursing notes.  Pertinent labs & imaging results that were available during my care of the patient were reviewed by me and considered in my medical decision making (see chart for details).  Clinical Course    Patient with history of sickle cell disease here with pain crisis. No evidence of acute infectious process. Her pain is improved after repeat assessment in the emergency department.  Plan to  DC home with outpatient follow-up and return precautions.  Final Clinical Impressions(s) / ED Diagnoses   Final diagnoses:  Sickle cell pain crisis (HCC)    New Prescriptions New Prescriptions   OXYCODONE (ROXICODONE) 5 MG  IMMEDIATE RELEASE TABLET    Take 1 tablet (5 mg total) by mouth every 4 (four) hours as needed for severe pain.     Quintella Reichert, MD 06/14/16 [phone]

## 2016-06-14 MED ORDER — OXYCODONE HCL 5 MG PO TABS: 5.0000 mg | ORAL_TABLET | ORAL | 0 refills | Status: DC | PRN

## 2017-01-07 IMAGING — CR DG CHEST 2V
2 series · 2 of 2 positions shown · non-contrast
Comparison: 11/20/2015

CLINICAL DATA: Sickle cell crisis with chest pain.

EXAM:
CHEST  2 VIEW

[w chest lat]
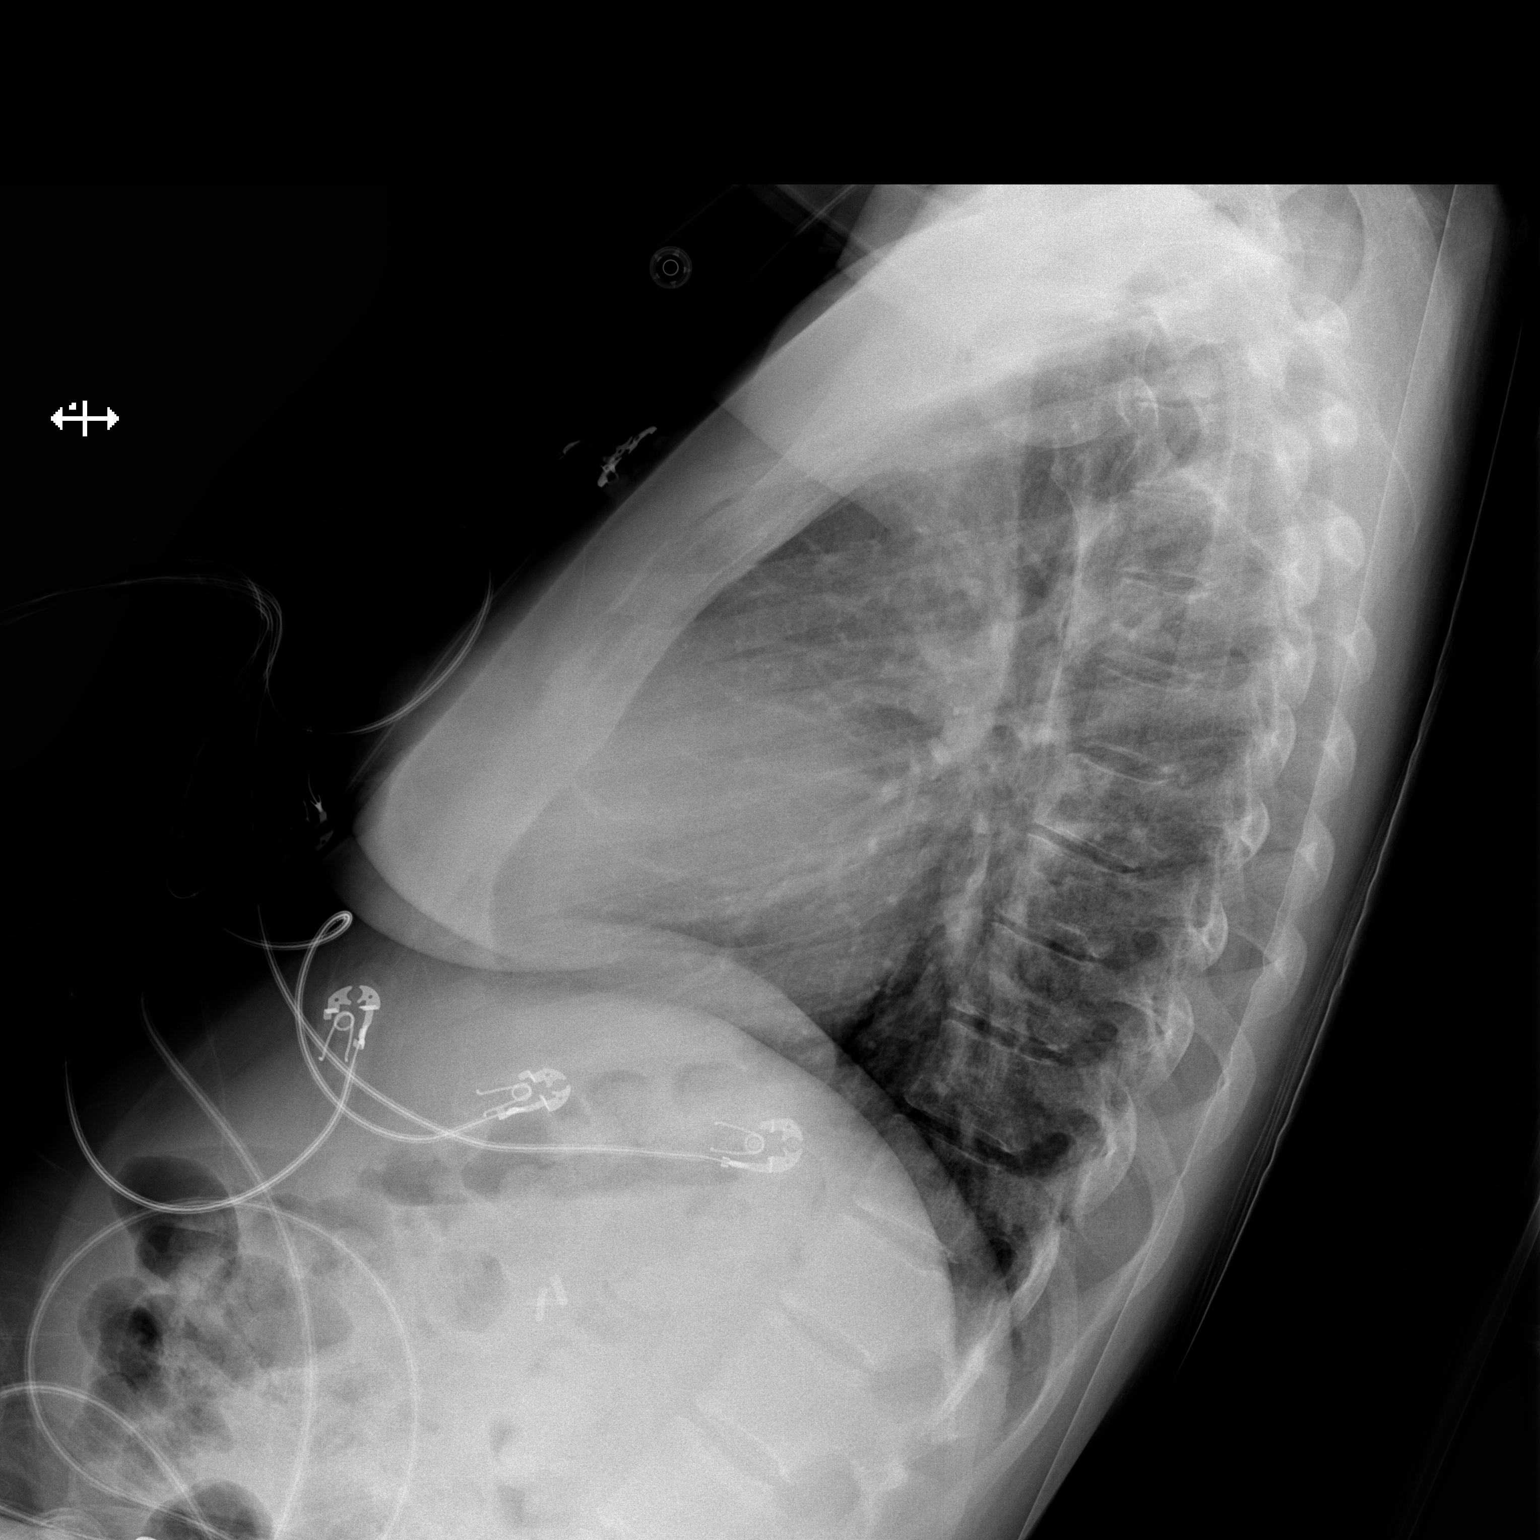

[x chest ap]
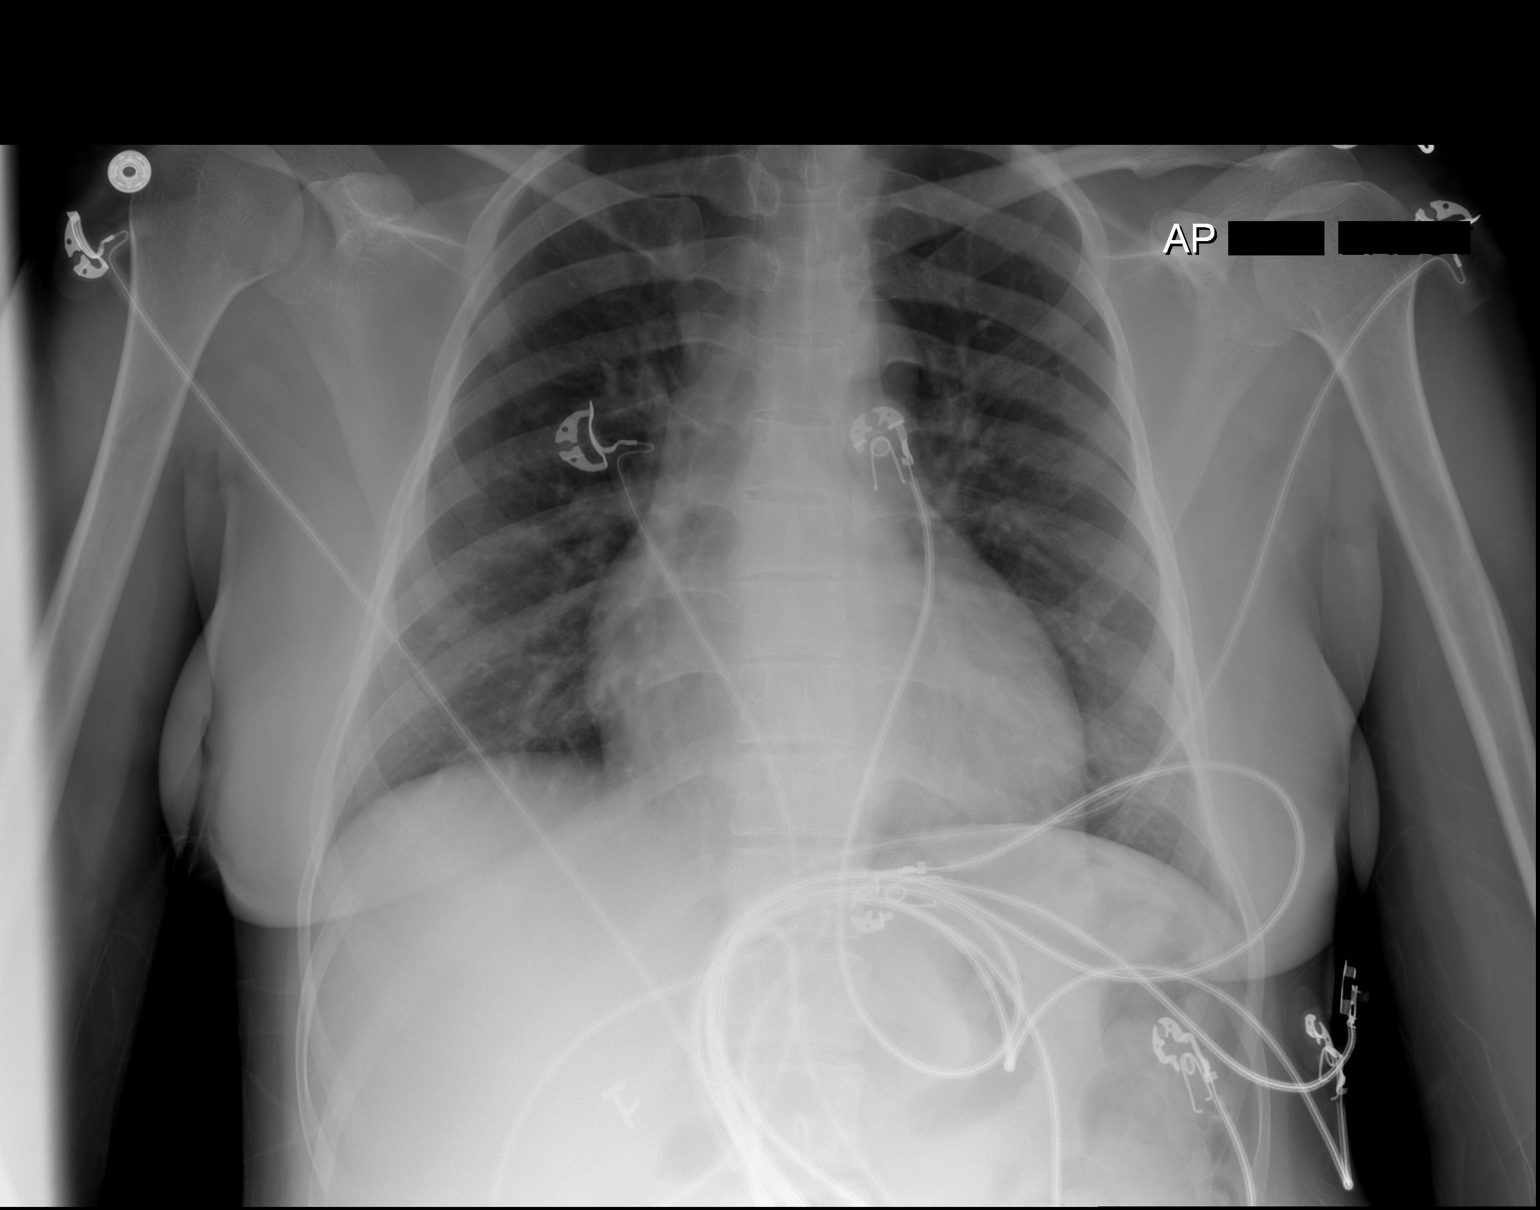

[2 of 2 positions shown; findings below may reference images not displayed]

FINDINGS: Borderline cardiac enlargement is stable. The mediastinal and hilar
contours are normal. The lungs are clear. No pleural effusion. The
bony thorax is intact.
IMPRESSION: No acute cardiopulmonary findings.

## 2017-04-18 ENCOUNTER — Emergency Department (HOSPITAL_COMMUNITY): Payer: Self-pay

## 2017-04-18 ENCOUNTER — Emergency Department (HOSPITAL_COMMUNITY)
Admission: EM | Admit: 2017-04-18 | Discharge: 2017-04-19 | Disposition: A | Discharge status: Home / Self Care | Payer: Self-pay | Attending: Emergency Medicine | Admitting: Emergency Medicine

## 2017-04-18 ENCOUNTER — Encounter (HOSPITAL_COMMUNITY): Payer: Self-pay | Admitting: Emergency Medicine

## 2017-04-18 DIAGNOSIS — D5700 Hb-SS disease with crisis, unspecified: Secondary | ICD-10-CM | POA: Insufficient documentation

## 2017-04-18 DIAGNOSIS — Z79899 Other long term (current) drug therapy: Secondary | ICD-10-CM | POA: Insufficient documentation

## 2017-04-18 LAB — CBC WITH DIFFERENTIAL/PLATELET
Basophils Absolute: 0 10*3/uL (ref 0.0–0.1)
Basophils Relative: 0 %
EOS ABS: 0.2 10*3/uL (ref 0.0–0.7)
Eosinophils Relative: 1 %
HEMATOCRIT: 23.5 % — AB (ref 36.0–46.0)
Hemoglobin: 8.5 g/dL — ABNORMAL LOW (ref 12.0–15.0)
LYMPHS ABS: 2.5 10*3/uL (ref 0.7–4.0)
Lymphocytes Relative: 14 %
MCH: 35.7 pg — AB (ref 26.0–34.0)
MCHC: 36.2 g/dL — AB (ref 30.0–36.0)
MCV: 98.7 fL (ref 78.0–100.0)
MONOS PCT: 10 %
Monocytes Absolute: 1.8 10*3/uL — ABNORMAL HIGH (ref 0.1–1.0)
NEUTROS ABS: 13 10*3/uL — AB (ref 1.7–7.7)
Neutrophils Relative %: 75 %
Platelets: 434 10*3/uL — ABNORMAL HIGH (ref 150–400)
RBC: 2.38 MIL/uL — ABNORMAL LOW (ref 3.87–5.11)
RDW: 19.7 % — AB (ref 11.5–15.5)
WBC: 17.5 10*3/uL — ABNORMAL HIGH (ref 4.0–10.5)

## 2017-04-18 LAB — COMPREHENSIVE METABOLIC PANEL
ALK PHOS: 72 U/L (ref 38–126)
ALT: 16 U/L (ref 14–54)
AST: 37 U/L (ref 15–41)
Albumin: 4.4 g/dL (ref 3.5–5.0)
Anion gap: 7 (ref 5–15)
BILIRUBIN TOTAL: 4.7 mg/dL — AB (ref 0.3–1.2)
BUN: 6 mg/dL (ref 6–20)
CALCIUM: 8.7 mg/dL — AB (ref 8.9–10.3)
CO2: 24 mmol/L (ref 22–32)
Chloride: 107 mmol/L (ref 101–111)
Creatinine, Ser: 0.35 mg/dL — ABNORMAL LOW (ref 0.44–1.00)
GFR calc Af Amer: >60 mL/min (ref >60)
GFR calc non Af Amer: >60 mL/min (ref >60)
GLUCOSE: 95 mg/dL (ref 65–99)
Potassium: 3.5 mmol/L (ref 3.5–5.1)
Sodium: 138 mmol/L (ref 135–145)
TOTAL PROTEIN: 7.6 g/dL (ref 6.5–8.1)

## 2017-04-18 LAB — TROPONIN I

## 2017-04-18 LAB — RETICULOCYTES
RBC.: 2.38 MIL/uL — ABNORMAL LOW (ref 3.87–5.11)
Retic Count, Absolute: 497.4 10*3/uL — ABNORMAL HIGH (ref 19.0–186.0)
Retic Ct Pct: 20.9 % — ABNORMAL HIGH (ref 0.4–3.1)

## 2017-04-18 MED ORDER — DEXTROSE-NACL 5-0.45 % IV SOLN
INTRAVENOUS | Status: DC
  Administered 2017-04-18: 21:00:00 via INTRAVENOUS

## 2017-04-18 MED ORDER — HYDROMORPHONE HCL 1 MG/ML IJ SOLN: 2.0000 mg | INTRAMUSCULAR | Status: AC

## 2017-04-18 MED ORDER — HYDROMORPHONE HCL 1 MG/ML IJ SOLN
2.0000 mg | INTRAMUSCULAR | Status: AC
  Administered 2017-04-18: 2 mg via INTRAVENOUS
  Filled 2017-04-18: qty 2

## 2017-04-18 MED ORDER — ONDANSETRON HCL 4 MG/2ML IJ SOLN
4.0000 mg | INTRAMUSCULAR | Status: DC | PRN
  Administered 2017-04-18: 4 mg via INTRAVENOUS
  Filled 2017-04-18: qty 2

## 2017-04-18 MED ORDER — HYDROMORPHONE HCL 1 MG/ML IJ SOLN
2.0000 mg | INTRAMUSCULAR | Status: DC
Start: 2017-04-18 — End: 2017-04-19
  Administered 2017-04-18: 2 mg via INTRAVENOUS
  Filled 2017-04-18: qty 2

## 2017-04-18 MED ORDER — HYDROMORPHONE HCL 1 MG/ML IJ SOLN: 2.0000 mg | INTRAMUSCULAR | Status: DC

## 2017-04-18 NOTE — ED Triage Notes (Signed)
Pt states she is having a sickle cell crisis that started this morning around 8am  Pt states she has pain in her back and chest  Pt states she feels like it is hard to breathe

## 2017-04-18 NOTE — ED Provider Notes (Signed)
[facility] DEPT Provider Note   CSN: [id number] Arrival date & time: 04/18/17  1934     History   Chief Complaint Chief Complaint  Patient presents with  . Sickle Cell Pain Crisis    HPI Gabriella Griffin is a 26 y.o. female.  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 She states that earlier today the pain started in her back and she took Dilaudid however the back pain continued to worsen and is now moved to the left side of her chest. She states the pain is so bad is making it difficult to breathe. She denies any fever or productive cough. She has had pain like this in the past that feels very similar. She is still taking all of her medications as prescribed. She has no real inciting event that she can think of to cause her crisis today. She denies any abdominal pain, vomiting,lower extremity pain or swelling.  She states the pain is sharp aching and tight. It is currently 8 out of 10.   The history is provided by the patient.  Sickle Cell Pain Crisis    Past Medical History:  Diagnosis Date  . Sickle cell anemia (HCC)   . Sickle cell anemia ([place]) 1996    Patient Active Problem List   Diagnosis Date Noted  . Female pelvic pain   . Fever 03/26/2016  . Abdominal pain 08/27/2015  . Preeclampsia, severe 08/21/2015  . Sickle cell anemia with crisis ([place]) 08/14/2015  . Sickle cell anemia with pain ([place]) 07/13/2015  . Sickle cell crisis ([place]) 06/11/2015  . 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 Leukocytosis 06/11/2015  . Hereditary disease in family possibly affecting fetus, affecting management of mother, antepartum condition or complication 23/76/2831  . Supervision of high-risk pregnancy 02/04/2015  . Maternal sickle cell anemia complicating pregnancy ([place]) 02/04/2015    Past Surgical History:  Procedure  Laterality Date  . CESAREAN SECTION N/A 08/25/2015   Procedure: CESAREAN SECTION;  Surgeon: [name], MD;  Location: [facility];  Service: Obstetrics;  Laterality: N/A;  . CHOLECYSTECTOMY  2007    OB History    Gravida Para Term Preterm AB Living   2 1 1     1    SAB TAB Ectopic Multiple Live Births         0 1       Home Medications    Prior to Admission medications   Medication Sig Start Date End Date Taking? Authorizing Provider  folic acid (FOLVITE) 1 MG tablet Take 2 tablets (2 mg total) by mouth daily. Patient taking differently: Take 1 mg by mouth daily.  06/16/15   [name], MD  HYDROmorphone (DILAUDID) 2 MG tablet Take 1 tablet (2 mg total) by mouth every 4 (four) hours as needed for severe pain. 08/27/15   Woodroe Mode, MD  hydroxyurea (HYDREA) 500 MG capsule Take 1 capsule (500 mg total) by mouth daily. May take with food to minimize GI side effects. Patient taking differently: Take 1,000 mg by mouth daily. May take with food to minimize GI side effects. 08/29/15   Seabron Spates, CNM  ibuprofen (ADVIL,MOTRIN) 600 MG tablet Take 1 tablet (600 mg total) by mouth every 6 (six) hours. Patient taking differently: Take 600 mg by mouth every 6 (six) hours as needed for moderate pain.  08/27/15  Woodroe Mode, MD  oxyCODONE (ROXICODONE) 5 MG immediate release tablet Take 1 tablet (5 mg total) by mouth every 4 (four) hours as needed for severe pain. 06/14/16   Quintella Reichert, MD    Family History Family History  Problem Relation Age of Onset  . Sickle cell anemia Sister   . Diabetes Paternal Grandmother   . Sickle cell trait Mother   . Sickle cell trait Father     Social History Social History  Substance Use Topics  . Smoking status: Never Smoker  . Smokeless tobacco: Never Used  . Alcohol use No     Allergies   Ceftin [cefuroxime axetil]   Review of Systems Review of Systems  All other systems reviewed and are negative.    Physical  Exam Updated Vital Signs BP 104/73 (BP Location: Right Arm)   Pulse 93   Temp 98.8 F (37.1 C) (Oral)   Resp 18   Ht 5\' 2"  (1.575 m)   Wt 53.5 kg (118 lb)   LMP 04/10/2017 (Approximate)   SpO2 96%   Breastfeeding? Unknown   BMI 21.58 kg/m   Physical Exam  Constitutional: She is oriented to person, place, and time. She appears well-developed and well-nourished. No distress.  HENT:  Head: Normocephalic and atraumatic.  Mouth/Throat: Oropharynx is clear and moist.  Eyes: Pupils are equal, round, and reactive to light. Conjunctivae and EOM are normal.  Neck: Normal range of motion. Neck supple.  Cardiovascular: Normal rate, regular rhythm and intact distal pulses.   No murmur heard. Pulmonary/Chest: Effort normal and breath sounds normal. No respiratory distress. She has no wheezes. She has no rales. She exhibits tenderness. She exhibits no retraction.    Abdominal: Soft. She exhibits no distension. There is no tenderness. There is no rebound and no guarding.  Musculoskeletal: Normal range of motion. She exhibits no edema.       Thoracic back: She exhibits tenderness.       Lumbar back: She exhibits tenderness.       Back:  Neurological: She is alert and oriented to person, place, and time.  Skin: Skin is warm and dry. No rash noted. No erythema.  Psychiatric: She has a normal mood and affect. Her behavior is normal.  Nursing note and vitals reviewed.    ED Treatments / Results  Labs (all labs ordered are listed, but only abnormal results are displayed) Labs Reviewed  COMPREHENSIVE METABOLIC PANEL - Abnormal; Notable for the following:       Result Value   Creatinine, Ser 0.35 (*)    Calcium 8.7 (*)    Total Bilirubin 4.7 (*)    All other components within normal limits  CBC WITH DIFFERENTIAL/PLATELET - Abnormal; Notable for the following:    WBC 17.5 (*)    RBC 2.38 (*)    Hemoglobin 8.5 (*)    HCT 23.5 (*)    MCH 35.7 (*)    MCHC 36.2 (*)    RDW 19.7 (*)     Platelets 434 (*)    Neutro Abs 13.0 (*)    Monocytes Absolute 1.8 (*)    All other components within normal limits  RETICULOCYTES - Abnormal; Notable for the following:    Retic Ct Pct 20.9 (*)    RBC. 2.38 (*)    Retic Count, Absolute 497.4 (*)    All other components within normal limits  TROPONIN I    EKG  EKG Interpretation  Date/Time:  Monday April 18 2017 19:46:28 EDT  Ventricular Rate:  93 PR Interval:    QRS Duration: 85 QT Interval:  338 QTC Calculation: 421 R Axis:   75 Text Interpretation:  Sinus rhythm Borderline repolarization abnormality No significant change since last tracing Confirmed by Blanchie Dessert [phone]) on 04/18/2017 8:12:43 PM       Radiology Dg Chest Port 1 View  Result Date: 04/18/2017 CLINICAL DATA:  Sickle cell crisis, onset at 08:00 today. Back and chest pain. Dyspnea. EXAM: PORTABLE CHEST 1 VIEW COMPARISON:  03/26/2016 FINDINGS: Unchanged cardiomegaly. The lungs are clear. The pulmonary vasculature is normal. There is no pleural effusion. There is no pneumothorax. Mediastinal and hilar contours are unremarkable and unchanged. IMPRESSION: Stable cardiomegaly.  No acute findings. Electronically Signed   By: Andreas Newport M.D.   On: 04/18/2017 21:07    Procedures Procedures (including critical care time)  Medications Ordered in ED Medications  dextrose 5 %-0.45 % sodium chloride infusion (not administered)  HYDROmorphone (DILAUDID) injection 2 mg (not administered)    Or  HYDROmorphone (DILAUDID) injection 2 mg (not administered)  HYDROmorphone (DILAUDID) injection 2 mg (not administered)    Or  HYDROmorphone (DILAUDID) injection 2 mg (not administered)  HYDROmorphone (DILAUDID) injection 2 mg (not administered)    Or  HYDROmorphone (DILAUDID) injection 2 mg (not administered)  HYDROmorphone (DILAUDID) injection 2 mg (not administered)    Or  HYDROmorphone (DILAUDID) injection 2 mg (not administered)  ondansetron (ZOFRAN)  injection 4 mg (not administered)     Initial Impression / Assessment and Plan / ED Course  I have reviewed the triage vital signs and the nursing notes.  Pertinent labs & imaging results that were available during my care of the patient were reviewed by me and considered in my medical decision making (see chart for details).     Patient presenting with symptoms typical of sickle cell pain crisis. She denies any symptoms concerning for infection at this time. Low suspicion for acute chest.  Patient's EKG without concerning findings. Patient states hemoglobin prior to discharge less than 1 month ago when she was admitted for crisis was 10 after transfusion. We'll recheck labs today. Chest x-raypending. Patient started on sickle cell protocol. No reason for dehydration sister then maintenance fluid.  10:47 PM Patient's labs with a leukocytosis and elevated reticulocyte count but stable hemoglobin and CMP. Troponin is negative. Chest x-ray without acute findings.  On reevaluation patient has received 2 rounds of Dilaudid and is already starting to feel significantly better. Will check back to see if her pain continues to improve but patient feels like she will most likely be able to go home.  11:36 PM Pt has now had 3 doses and pain is improving.  Currently very tired.  Will re-evaluate and checked pt out to Dr. Stark Jock Final Clinical Impressions(s) / ED Diagnoses   Final diagnoses:  Sickle cell pain crisis [facility])    New Prescriptions New Prescriptions   No medications on file     Blanchie Dessert, MD 04/18/17 2342

## 2017-05-08 ENCOUNTER — Inpatient Hospital Stay (HOSPITAL_COMMUNITY)
Admission: EM | Admit: 2017-05-08 | Discharge: 2017-05-14 | DRG: 812 | Disposition: A | Discharge status: Home / Self Care | Payer: Self-pay | Attending: Internal Medicine | Admitting: Internal Medicine

## 2017-05-08 ENCOUNTER — Encounter (HOSPITAL_COMMUNITY): Payer: Self-pay | Admitting: Emergency Medicine

## 2017-05-08 DIAGNOSIS — J209 Acute bronchitis, unspecified: Secondary | ICD-10-CM | POA: Diagnosis present

## 2017-05-08 DIAGNOSIS — R509 Fever, unspecified: Secondary | ICD-10-CM

## 2017-05-08 DIAGNOSIS — D599 Acquired hemolytic anemia, unspecified: Secondary | ICD-10-CM | POA: Diagnosis present

## 2017-05-08 DIAGNOSIS — K5900 Constipation, unspecified: Secondary | ICD-10-CM | POA: Diagnosis present

## 2017-05-08 DIAGNOSIS — Z79899 Other long term (current) drug therapy: Secondary | ICD-10-CM

## 2017-05-08 DIAGNOSIS — D638 Anemia in other chronic diseases classified elsewhere: Secondary | ICD-10-CM | POA: Diagnosis present

## 2017-05-08 DIAGNOSIS — Z881 Allergy status to other antibiotic agents status: Secondary | ICD-10-CM

## 2017-05-08 DIAGNOSIS — D564 Hereditary persistence of fetal hemoglobin [HPFH]: Secondary | ICD-10-CM | POA: Diagnosis present

## 2017-05-08 DIAGNOSIS — R0902 Hypoxemia: Secondary | ICD-10-CM

## 2017-05-08 DIAGNOSIS — D5700 Hb-SS disease with crisis, unspecified: Principal | ICD-10-CM | POA: Diagnosis present

## 2017-05-08 DIAGNOSIS — G43909 Migraine, unspecified, not intractable, without status migrainosus: Secondary | ICD-10-CM | POA: Diagnosis present

## 2017-05-08 LAB — COMPREHENSIVE METABOLIC PANEL
ALT: 16 U/L (ref 14–54)
ANION GAP: 7 (ref 5–15)
AST: 42 U/L — ABNORMAL HIGH (ref 15–41)
Albumin: 4.6 g/dL (ref 3.5–5.0)
Alkaline Phosphatase: 67 U/L (ref 38–126)
BILIRUBIN TOTAL: 6.2 mg/dL — AB (ref 0.3–1.2)
BUN: 7 mg/dL (ref 6–20)
CHLORIDE: 108 mmol/L (ref 101–111)
CO2: 25 mmol/L (ref 22–32)
Calcium: 9.1 mg/dL (ref 8.9–10.3)
Creatinine, Ser: 0.31 mg/dL — ABNORMAL LOW (ref 0.44–1.00)
GFR calc Af Amer: >60 mL/min (ref >60)
Glucose, Bld: 105 mg/dL — ABNORMAL HIGH (ref 65–99)
POTASSIUM: 3.8 mmol/L (ref 3.5–5.1)
Sodium: 140 mmol/L (ref 135–145)
TOTAL PROTEIN: 7.9 g/dL (ref 6.5–8.1)

## 2017-05-08 LAB — CBC WITH DIFFERENTIAL/PLATELET
BAND NEUTROPHILS: 10 %
BASOS ABS: 0.2 10*3/uL — AB (ref 0.0–0.1)
BLASTS: 0 %
Basophils Relative: 2 %
EOS ABS: 0.3 10*3/uL (ref 0.0–0.7)
Eosinophils Relative: 3 %
HEMATOCRIT: 22.3 % — AB (ref 36.0–46.0)
HEMOGLOBIN: 8.1 g/dL — AB (ref 12.0–15.0)
LYMPHS PCT: 43 %
Lymphs Abs: 4 10*3/uL (ref 0.7–4.0)
MCH: 35.1 pg — ABNORMAL HIGH (ref 26.0–34.0)
MCHC: 36.3 g/dL — ABNORMAL HIGH (ref 30.0–36.0)
MCV: 96.5 fL (ref 78.0–100.0)
METAMYELOCYTES PCT: 0 %
MONO ABS: 0.6 10*3/uL (ref 0.1–1.0)
MONOS PCT: 6 %
Myelocytes: 0 %
NEUTROS ABS: 3.8 10*3/uL (ref 1.7–7.7)
NEUTROS PCT: 31 %
Other: 5 %
PROMYELOCYTES ABS: 0 %
Platelets: 469 10*3/uL — ABNORMAL HIGH (ref 150–400)
RBC: 2.31 MIL/uL — ABNORMAL LOW (ref 3.87–5.11)
RDW: 19.7 % — AB (ref 11.5–15.5)
WBC: 9.2 10*3/uL (ref 4.0–10.5)
nRBC: 5 /100 WBC — ABNORMAL HIGH

## 2017-05-08 LAB — URINALYSIS, MICROSCOPIC (REFLEX): Bacteria, UA: NONE SEEN

## 2017-05-08 LAB — RETICULOCYTES
RBC.: 2.31 MIL/uL — AB (ref 3.87–5.11)
RETIC COUNT ABSOLUTE: 464.3 10*3/uL — AB (ref 19.0–186.0)
Retic Ct Pct: 20.1 % — ABNORMAL HIGH (ref 0.4–3.1)

## 2017-05-08 LAB — URINALYSIS, ROUTINE W REFLEX MICROSCOPIC
BILIRUBIN URINE: NEGATIVE
GLUCOSE, UA: NEGATIVE mg/dL
Ketones, ur: NEGATIVE mg/dL
Nitrite: NEGATIVE
PROTEIN: NEGATIVE mg/dL
Specific Gravity, Urine: 1.015 (ref 1.005–1.030)
pH: 6 (ref 5.0–8.0)

## 2017-05-08 LAB — PREGNANCY, URINE: Preg Test, Ur: NEGATIVE

## 2017-05-08 MED ORDER — OXYCODONE-ACETAMINOPHEN 5-325 MG PO TABS
1.0000 | ORAL_TABLET | Freq: Once | ORAL | Status: AC
  Administered 2017-05-08: 1 via ORAL
  Filled 2017-05-08: qty 1

## 2017-05-08 MED ORDER — HYDROMORPHONE HCL 1 MG/ML IJ SOLN
0.5000 mg | INTRAMUSCULAR | Status: DC
  Filled 2017-05-08: qty 1

## 2017-05-08 MED ORDER — HYDROXYUREA 500 MG PO CAPS
1000.0000 mg | ORAL_CAPSULE | Freq: Every day | ORAL | Status: DC
  Administered 2017-05-09 – 2017-05-14 (×6): 1000 mg via ORAL
  Filled 2017-05-08 (×6): qty 2

## 2017-05-08 MED ORDER — HYDROMORPHONE HCL 1 MG/ML IJ SOLN
1.0000 mg | INTRAMUSCULAR | Status: AC | PRN
  Administered 2017-05-08: 1 mg via INTRAVENOUS
  Filled 2017-05-08: qty 1

## 2017-05-08 MED ORDER — ONDANSETRON HCL 4 MG/2ML IJ SOLN
4.0000 mg | Freq: Once | INTRAMUSCULAR | Status: AC
  Administered 2017-05-08: 4 mg via INTRAVENOUS
  Filled 2017-05-08: qty 2

## 2017-05-08 MED ORDER — HYDROMORPHONE HCL 1 MG/ML IJ SOLN: 1.0000 mg | INTRAMUSCULAR | Status: AC | PRN

## 2017-05-08 MED ORDER — ONDANSETRON HCL 4 MG/2ML IJ SOLN
4.0000 mg | Freq: Four times a day (QID) | INTRAMUSCULAR | Status: DC | PRN
  Administered 2017-05-09 – 2017-05-11 (×4): 4 mg via INTRAVENOUS
  Filled 2017-05-08 (×4): qty 2

## 2017-05-08 MED ORDER — POLYETHYLENE GLYCOL 3350 17 G PO PACK: 17.0000 g | PACK | Freq: Every day | ORAL | Status: DC | PRN

## 2017-05-08 MED ORDER — HYDROMORPHONE HCL 1 MG/ML IJ SOLN
1.0000 mg | INTRAMUSCULAR | Status: AC | PRN
  Administered 2017-05-08: 1 mg via INTRAVENOUS

## 2017-05-08 MED ORDER — SENNOSIDES-DOCUSATE SODIUM 8.6-50 MG PO TABS
1.0000 | ORAL_TABLET | Freq: Two times a day (BID) | ORAL | Status: DC
  Administered 2017-05-09 – 2017-05-11 (×4): 1 via ORAL
  Filled 2017-05-08 (×12): qty 1

## 2017-05-08 MED ORDER — ENOXAPARIN SODIUM 40 MG/0.4ML ~~LOC~~ SOLN
40.0000 mg | Freq: Every day | SUBCUTANEOUS | Status: DC
  Administered 2017-05-09 – 2017-05-13 (×5): 40 mg via SUBCUTANEOUS
  Filled 2017-05-08 (×6): qty 0.4

## 2017-05-08 MED ORDER — NALOXONE HCL 0.4 MG/ML IJ SOLN: 0.4000 mg | INTRAMUSCULAR | Status: DC | PRN

## 2017-05-08 MED ORDER — KETOROLAC TROMETHAMINE 30 MG/ML IJ SOLN
30.0000 mg | Freq: Once | INTRAMUSCULAR | Status: AC
  Administered 2017-05-08: 30 mg via INTRAVENOUS
  Filled 2017-05-08: qty 1

## 2017-05-08 MED ORDER — HYDROMORPHONE 1 MG/ML IV SOLN
INTRAVENOUS | Status: DC
  Administered 2017-05-08: via INTRAVENOUS
  Administered 2017-05-09: 1.2 mg via INTRAVENOUS
  Administered 2017-05-09: 1.8 mg via INTRAVENOUS
  Administered 2017-05-09: 0.9 mg via INTRAVENOUS
  Administered 2017-05-09: 1.2 mg via INTRAVENOUS
  Administered 2017-05-09: 0.9 mg via INTRAVENOUS
  Administered 2017-05-09: 4.4 mg via INTRAVENOUS
  Administered 2017-05-10: 1.5 mg via INTRAVENOUS
  Administered 2017-05-10: 1.1 mg via INTRAVENOUS
  Administered 2017-05-10: 2.4 mg via INTRAVENOUS
  Administered 2017-05-10: 0.9 mg via INTRAVENOUS
  Administered 2017-05-10: 2.7 mg via INTRAVENOUS
  Administered 2017-05-11: 0.3 mg via INTRAVENOUS
  Administered 2017-05-11: 1.8 mg via INTRAVENOUS
  Administered 2017-05-11: 07:00:00 via INTRAVENOUS
  Administered 2017-05-11: 0.6 mg via INTRAVENOUS
  Administered 2017-05-11: 1.2 mg via INTRAVENOUS
  Filled 2017-05-08 (×3): qty 25

## 2017-05-08 MED ORDER — HYDROMORPHONE HCL 1 MG/ML IJ SOLN
0.5000 mg | Freq: Once | INTRAMUSCULAR | Status: AC
  Administered 2017-05-08: 0.5 mg via INTRAMUSCULAR
  Filled 2017-05-08: qty 1

## 2017-05-08 MED ORDER — SODIUM CHLORIDE 0.9% FLUSH: 9.0000 mL | INTRAVENOUS | Status: DC | PRN

## 2017-05-08 MED ORDER — KETOROLAC TROMETHAMINE 15 MG/ML IJ SOLN
15.0000 mg | Freq: Four times a day (QID) | INTRAMUSCULAR | Status: DC
  Administered 2017-05-09 (×2): 15 mg via INTRAVENOUS
  Filled 2017-05-08 (×2): qty 1

## 2017-05-08 MED ORDER — SODIUM CHLORIDE 0.45 % IV SOLN
INTRAVENOUS | Status: AC
  Administered 2017-05-08 – 2017-05-09 (×2): via INTRAVENOUS

## 2017-05-08 MED ORDER — FOLIC ACID 1 MG PO TABS
1.0000 mg | ORAL_TABLET | Freq: Every day | ORAL | Status: DC
  Administered 2017-05-09 – 2017-05-10 (×2): 1 mg via ORAL
  Filled 2017-05-08 (×2): qty 1

## 2017-05-08 NOTE — ED Provider Notes (Signed)
[facility] DEPT Provider Note   CSN: [id number] Arrival date & time: 05/08/17  1152     History   Chief Complaint Chief Complaint  Patient presents with  . Sickle Cell Pain Crisis    HPI Gabriella Griffin is a 26 y.o. female with a history of sickle cell anemia who presents today for evaluation of reported sickle cell pain crisis. She reports that her pain is in her left leg and lower back, this is her normal pain location. She reports that she ran out of her home Dilaudid.  She denies any fevers or chills, no nausea, vomiting, or diarrhea.  She denies abdominal pain, no diarrhea or constipation.  No CP, SOB.  She reports her LMP was 1-2 weeks ago, denies possibility of pregnancy.    HPI  Past Medical History:  Diagnosis Date  . Sickle cell anemia (HCC)   . Sickle cell anemia ([place]) 1996    Patient Active Problem List   Diagnosis Date Noted  . Female pelvic pain   . Fever 03/26/2016  . Abdominal pain 08/27/2015  . Preeclampsia, severe 08/21/2015  . Sickle cell anemia with crisis ([place]) 08/14/2015  . Sickle cell anemia with pain ([place]) 07/13/2015  . Sickle cell crisis ([place]) 06/11/2015  . 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 Leukocytosis 06/11/2015  . Hereditary disease in family possibly affecting fetus, affecting management of mother, antepartum condition or complication 92/42/6834  . Supervision of high-risk pregnancy 02/04/2015  . Maternal sickle cell anemia complicating pregnancy ([place]) 02/04/2015    Past Surgical History:  Procedure Laterality Date  . CESAREAN SECTION N/A 08/25/2015   Procedure: CESAREAN SECTION;  Surgeon: [name], MD;  Location: [facility];  Service: Obstetrics;  Laterality: N/A;  . CHOLECYSTECTOMY  2007    OB History    Gravida Para Term Preterm AB Living   2 1 1     1    SAB TAB Ectopic Multiple Live Births         0 1       Home Medications    Prior to Admission  medications   Medication Sig Start Date End Date Taking? Authorizing Provider  folic acid (FOLVITE) 1 MG tablet Take 2 tablets (2 mg total) by mouth daily. Patient taking differently: Take 1 mg by mouth daily.  06/16/15  Yes [name], MD  HYDROmorphone (DILAUDID) 2 MG tablet Take 1 tablet (2 mg total) by mouth every 4 (four) hours as needed for severe pain. 08/27/15  Yes Woodroe Mode, MD  hydroxyurea (HYDREA) 500 MG capsule Take 1 capsule (500 mg total) by mouth daily. May take with food to minimize GI side effects. Patient taking differently: Take 1,000 mg by mouth daily. May take with food to minimize GI side effects. 08/29/15  Yes Seabron Spates, CNM  naproxen sodium (ANAPROX) 220 MG tablet Take 440 mg by mouth daily as needed (pain).   Yes [provider]    Family History Family History  Problem Relation Age of Onset  . Sickle cell anemia Sister   . Diabetes Paternal Grandmother   . Sickle cell trait Mother   . Sickle cell trait Father     Social History Social History  Substance Use Topics  . Smoking status: Never Smoker  . Smokeless tobacco: Never Used  . Alcohol use No     Allergies   Ceftin [cefuroxime axetil]   Review of Systems Review of Systems  Constitutional: Negative for chills, diaphoresis and fever.  HENT: Negative for ear pain and sore throat.   Eyes: Negative for pain and visual disturbance.  Respiratory: Negative for cough and shortness of breath.   Cardiovascular: Negative for chest pain and palpitations.  Gastrointestinal: Negative for abdominal pain and vomiting.  Genitourinary: Negative for dysuria and hematuria.  Musculoskeletal: Positive for arthralgias, back pain and myalgias. Negative for gait problem, joint swelling and neck stiffness.  Skin: Negative for color change and rash.  Neurological: Negative for seizures, syncope and headaches.  All other systems reviewed and are negative.    Physical Exam Updated Vital  Signs BP 110/72   Pulse 77   Temp 98.3 F (36.8 C) (Oral)   Resp 16   Ht 5\' 2"  (1.575 m)   Wt 53.5 kg (118 lb)   LMP 04/10/2017 (Approximate)   SpO2 98%   BMI 21.58 kg/m   Physical Exam  Constitutional: She appears well-developed and well-nourished. No distress.  HENT:  Head: Normocephalic and atraumatic.  Mouth/Throat: Oropharynx is clear and moist.  Eyes: Conjunctivae are normal.  Neck: Normal range of motion. Neck supple.  Cardiovascular: Normal rate, regular rhythm and intact distal pulses.   No murmur heard. Bilateral upper and lower extremities are warm and well perfused.  Pulmonary/Chest: Effort normal and breath sounds normal. No respiratory distress.  Abdominal: Soft. There is no tenderness.  Musculoskeletal: She exhibits no edema or deformity.  TTP over lumbar spine and bilateral paraspinal muscles.  TTP over left anterior thigh.  No obvious deformities, ecchymosis, edema, or erythema. There are no obvious skin breaks over these areas. 5 out of 5 strength in bilateral upper and lower extremities.  Lymphadenopathy:    She has no cervical adenopathy.  Neurological: She is alert.  Intact sensation to bilateral upper and lower extremities.  Skin: Skin is warm and dry.  Psychiatric: She has a normal mood and affect.  Nursing note and vitals reviewed.    ED Treatments / Results  Labs (all labs ordered are listed, but only abnormal results are displayed) Labs Reviewed  COMPREHENSIVE METABOLIC PANEL - Abnormal; Notable for the following:       Result Value   Glucose, Bld 105 (*)    Creatinine, Ser 0.31 (*)    AST 42 (*)    Total Bilirubin 6.2 (*)    All other components within normal limits  CBC WITH DIFFERENTIAL/PLATELET - Abnormal; Notable for the following:    RBC 2.31 (*)    Hemoglobin 8.1 (*)    HCT 22.3 (*)    MCH 35.1 (*)    MCHC 36.3 (*)    RDW 19.7 (*)    Platelets 469 (*)    nRBC 5 (*)    Basophils Absolute 0.2 (*)    All other components within  normal limits  RETICULOCYTES - Abnormal; Notable for the following:    Retic Ct Pct 20.1 (*)    RBC. 2.31 (*)    Retic Count, Absolute 464.3 (*)    All other components within normal limits  URINALYSIS, ROUTINE W REFLEX MICROSCOPIC - Abnormal; Notable for the following:    Hgb urine dipstick TRACE (*)    Leukocytes, UA TRACE (*)    All other components within normal limits  URINALYSIS, MICROSCOPIC (REFLEX) - Abnormal; Notable for the following:    Squamous Epithelial / LPF 0-5 (*)    All other components within normal limits  PREGNANCY, URINE    EKG  EKG Interpretation None  Radiology No results found.  Procedures Procedures (including critical care time)  Medications Ordered in ED Medications  HYDROmorphone (DILAUDID) injection 1 mg (1 mg Intravenous Given 05/08/17 1814)    Or  HYDROmorphone (DILAUDID) injection 1 mg ( Subcutaneous See Alternative 05/08/17 1814)  HYDROmorphone (DILAUDID) injection 1 mg (1 mg Intravenous Given 05/08/17 1616)    Or  HYDROmorphone (DILAUDID) injection 1 mg ( Subcutaneous See Alternative 05/08/17 1616)  ondansetron (ZOFRAN) injection 4 mg (4 mg Intravenous Given 05/08/17 1617)  ketorolac (TORADOL) 30 MG/ML injection 30 mg (30 mg Intravenous Given 05/08/17 1726)  oxyCODONE-acetaminophen (PERCOCET/ROXICET) 5-325 MG per tablet 1 tablet (1 tablet Oral Given 05/08/17 2025)  HYDROmorphone (DILAUDID) injection 0.5 mg (0.5 mg Intramuscular Given 05/08/17 2025)     Initial Impression / Assessment and Plan / ED Course  I have reviewed the triage vital signs and the nursing notes.  Pertinent labs & imaging results that were available during my care of the patient were reviewed by me and considered in my medical decision making (see chart for details).  Clinical Course as of May 08 2153  Nancy Fetter May 08, 2017  1652 Checked patient, she says her pain went from a 8/10 to a 6/10 after her first dose of dilaudid.    [EH]  1752 Patient has not been asking for  her dilaudid and therefore not gotten it yet.  Informed patient she needs to ask for it.   [EH]  2007 Patient re-checked, has not yet gotten pain pill.    [EH]  2111 Talked to hospitalist who will come and see patient.   [EH]    Clinical Course User Index [EH] Lorin Glass, PA-C   [name] presents for Evaluation and treatment of a sickle cell pain crisis. She reports that this is her normal pain crisis location. She denies any fevers or chills, no nausea/vomiting/diarrhea or other potentially infectious symptoms.  She has pain in her bilateral lumbar back, and her left thigh.  Labs were obtained, retic count is elevated.  Hemoglobin, bilirubin, and other labs appear consistent with her baseline.  No significant leukocytosis.  There were difficulties in obtaining and maintaining a IV, so she was switched to by mouth/IM meds pending repeat IV.  Her Pain was decreased but not controlled after multiple doses of IV, IM, and PO medications.  She and I had a discussion about disposition and made the joint decision to pursue admission for pain control.  Hospitalist was consulted who agreed to see her and admit her.    The patient appears reasonably stabilized for admission considering the current resources, flow, and capabilities available in the ED at this time, and I doubt any other [facility] requiring further screening and/or treatment in the ED prior to admission.    Final Clinical Impressions(s) / ED Diagnoses   Final diagnoses:  Sickle cell pain crisis [facility])    New Prescriptions New Prescriptions   No medications on file     Ollen Gross 05/08/17 2202    Orlie Dakin, MD 05/09/17 1355

## 2017-05-08 NOTE — ED Notes (Signed)
IV start attempted x2. Charge nurse notified. Will use ultrasound to get IV access.

## 2017-05-08 NOTE — H&P (Signed)
History and Physical    Gabriella Griffin EPP:[id number] DOB: 08/21/1990 DOA: 05/08/2017  PCP: Patient, No Pcp Per  Patient coming from: Home.  Chief Complaint: Low back pain.  HPI: Gabriella Griffin is a 26 y.o. female with history of sickle cell anemia presents with worsening low back pain over the last 24 hours. Patient usually takes Dilaudid for pain but has run out of Dilaudid. Denies any chest pain shortness of breath nausea vomiting headache visual symptoms.   ED Course: In the ER patient was given multiple doses of pain relief medications despite which patient was still in pain and has been admitted for pain control. UA is unremarkable. Patient is afebrile. Patient's hemoglobin appears to be at baseline.  Review of Systems: As per HPI, rest all negative.   Past Medical History:  Diagnosis Date  . Sickle cell anemia (HCC)   . Sickle cell anemia ([place]) 1996    Past Surgical History:  Procedure Laterality Date  . CESAREAN SECTION N/A 08/25/2015   Procedure: CESAREAN SECTION;  Surgeon: [name], MD;  Location: [facility];  Service: Obstetrics;  Laterality: N/A;  . CHOLECYSTECTOMY  2007     reports that she has never smoked. She has never used smokeless tobacco. She reports that she does not drink alcohol or use drugs.  Allergies  Allergen Reactions  . Ceftin [Cefuroxime Axetil] Rash    Family History  Problem Relation Age of Onset  . Sickle cell anemia Sister   . Diabetes Paternal Grandmother   . Sickle cell trait Mother   . Sickle cell trait Father     Prior to Admission medications   Medication Sig Start Date End Date Taking? Authorizing Provider  folic acid (FOLVITE) 1 MG tablet Take 2 tablets (2 mg total) by mouth daily. Patient taking differently: Take 1 mg by mouth daily.  06/16/15  Yes [name], MD  HYDROmorphone (DILAUDID) 2 MG tablet Take 1 tablet (2 mg total) by mouth every 4 (four) hours as needed for severe pain. 08/27/15  Yes Woodroe Mode,  MD  hydroxyurea (HYDREA) 500 MG capsule Take 1 capsule (500 mg total) by mouth daily. May take with food to minimize GI side effects. Patient taking differently: Take 1,000 mg by mouth daily. May take with food to minimize GI side effects. 08/29/15  Yes Seabron Spates, CNM  naproxen sodium (ANAPROX) 220 MG tablet Take 440 mg by mouth daily as needed (pain).   Yes [provider]    Physical Exam: Vitals:   05/08/17 1730 05/08/17 1849 05/08/17 1926 05/08/17 2118  BP: 103/60 99/70 (!) 98/44 (!) 114/59  Pulse:   77 75  Resp: 16 16 18  (!) 23  Temp:      TempSrc:      SpO2: 100% 100% 94% 98%  Weight:      Height:          Constitutional: Moderately built and nourished. Vitals:   05/08/17 1730 05/08/17 1849 05/08/17 1926 05/08/17 2118  BP: 103/60 99/70 (!) 98/44 (!) 114/59  Pulse:   77 75  Resp: 16 16 18  (!) 23  Temp:      TempSrc:      SpO2: 100% 100% 94% 98%  Weight:      Height:       Eyes: Anicteric mild pallor. ENMT: No discharge from the ears eyes nose and mouth. Neck: No mass felt. No neck rigidity. Respiratory: No rhonchi or crepitations. Cardiovascular: S1-S2 heard no murmurs appreciated. Abdomen:  Soft nontender bowel sounds present. No guarding or rigidity. Musculoskeletal: No edema. No joint effusion. Skin: No rashes. Skin appears warm. Neurologic: Alert awake oriented to time place and person. Moves all extremities. Psychiatric: Appears normal. Normal affect.   Labs on Admission: I have personally reviewed following labs and imaging studies  CBC:  Recent Labs Lab 05/08/17 1551  WBC 9.2  NEUTROABS 3.8  HGB 8.1*  HCT 22.3*  MCV 96.5  PLT 295*   Basic Metabolic Panel:  Recent Labs Lab 05/08/17 1551  NA 140  K 3.8  CL 108  CO2 25  GLUCOSE 105*  BUN 7  CREATININE 0.31*  CALCIUM 9.1   GFR: Estimated Creatinine Clearance: 85 mL/min (A) (by C-G formula based on SCr of 0.31 mg/dL (L)). Liver Function Tests:  Recent Labs Lab  05/08/17 1551  AST 42*  ALT 16  ALKPHOS 67  BILITOT 6.2*  PROT 7.9  ALBUMIN 4.6   No results for input(s): LIPASE, AMYLASE in the last 168 hours. No results for input(s): AMMONIA in the last 168 hours. Coagulation Profile: No results for input(s): INR, PROTIME in the last 168 hours. Cardiac Enzymes: No results for input(s): CKTOTAL, CKMB, CKMBINDEX, TROPONINI in the last 168 hours. BNP (last 3 results) No results for input(s): PROBNP in the last 8760 hours. HbA1C: No results for input(s): HGBA1C in the last 72 hours. CBG: No results for input(s): GLUCAP in the last 168 hours. Lipid Profile: No results for input(s): CHOL, HDL, LDLCALC, TRIG, CHOLHDL, LDLDIRECT in the last 72 hours. Thyroid Function Tests: No results for input(s): TSH, T4TOTAL, FREET4, T3FREE, THYROIDAB in the last 72 hours. Anemia Panel:  Recent Labs  05/08/17 1551  RETICCTPCT 20.1*   Urine analysis:    Component Value Date/Time   COLORURINE YELLOW 05/08/2017 [place] 05/08/2017 1551   LABSPEC 1.015 05/08/2017 1551   PHURINE 6.0 05/08/2017 1551   GLUCOSEU NEGATIVE 05/08/2017 1551   HGBUR TRACE (A) 05/08/2017 1551   BILIRUBINUR NEGATIVE 05/08/2017 1551   KETONESUR NEGATIVE 05/08/2017 1551   PROTEINUR NEGATIVE 05/08/2017 1551   UROBILINOGEN 2.0 (H) 08/06/2015 1051   NITRITE NEGATIVE 05/08/2017 1551   LEUKOCYTESUR TRACE (A) 05/08/2017 1551   Sepsis Labs: @LABRCNTIP (procalcitonin:4,lacticidven:4) )No results found for this or any previous visit (from the past 240 hour(s)).   Radiological Exams on Admission: No results found.   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 Sickle cell pain crisis - patient has been placed on IV Dilaudid PCA. Also patient has been placed on scheduled dose of Toradol. Follow CBC and check LDH. Patient is on  hydroxyurea. 2. Anemia from sickle cell disease - follow CBC.  I have reviewed patient's old charts and labs.   DVT prophylaxis: Lovenox. Code Status: Full code.  Family Communication: Discussed with patient.  Disposition Plan: Home.  Consults called: None.  Admission status: Inpatient.    [name] MD [facility] Pager [phone].  If 7PM-7AM, please contact night-coverage [email or web address] Password [email or web address]  05/08/2017, 10:14 PM

## 2017-05-08 NOTE — ED Provider Notes (Signed)
Complains of low back pain and left thigh pain onset last night typical sickle cell crisis she's had in the past. She denies any fever denies shortness of breath denies chest pain. She ran out of her home hydromorphone therefore no treatment prior to coming here. No other associated symptoms   Gabriella Dakin, MD 05/08/17 1654

## 2017-05-08 NOTE — ED Triage Notes (Signed)
Pt reports sickle cell crisis pain in L leg and lower back that began at 8pm last night. Pt out of home pain medications.

## 2017-05-09 DIAGNOSIS — D5700 Hb-SS disease with crisis, unspecified: Principal | ICD-10-CM

## 2017-05-09 DIAGNOSIS — G43009 Migraine without aura, not intractable, without status migrainosus: Secondary | ICD-10-CM

## 2017-05-09 DIAGNOSIS — D638 Anemia in other chronic diseases classified elsewhere: Secondary | ICD-10-CM

## 2017-05-09 LAB — LACTATE DEHYDROGENASE: LDH: 518 U/L — AB (ref 98–192)

## 2017-05-09 MED ORDER — KETOROLAC TROMETHAMINE 15 MG/ML IJ SOLN
15.0000 mg | Freq: Four times a day (QID) | INTRAMUSCULAR | Status: AC
  Administered 2017-05-09 – 2017-05-10 (×5): 15 mg via INTRAVENOUS
  Filled 2017-05-09 (×5): qty 1

## 2017-05-09 MED ORDER — HYDROMORPHONE HCL 4 MG PO TABS
2.0000 mg | ORAL_TABLET | Freq: Four times a day (QID) | ORAL | Status: DC
  Administered 2017-05-09 – 2017-05-10 (×3): 2 mg via ORAL
  Filled 2017-05-09 (×4): qty 1

## 2017-05-09 MED ORDER — OXYCODONE-ACETAMINOPHEN 5-325 MG PO TABS
1.0000 | ORAL_TABLET | Freq: Once | ORAL | Status: AC
  Administered 2017-05-09: 1 via ORAL
  Filled 2017-05-09: qty 1

## 2017-05-09 MED ORDER — NAPROXEN 500 MG PO TABS: 250.0000 mg | ORAL_TABLET | Freq: Two times a day (BID) | ORAL | Status: DC

## 2017-05-09 NOTE — Progress Notes (Signed)
Refuses Kpad

## 2017-05-09 NOTE — Progress Notes (Signed)
Nutrition Brief Note  Patient identified on the Malnutrition Screening Tool (MST) Report  Wt Readings from Last 15 Encounters:  05/09/17 116 lb 13.5 oz (53 kg)  04/18/17 118 lb (53.5 kg)  06/13/16 115 lb (52.2 kg)  03/29/16 122 lb 9.2 oz (55.6 kg)  08/29/15 133 lb 4 oz (60.4 kg)  08/09/15 133 lb 3.2 oz (60.4 kg)  08/06/15 132 lb 9.6 oz (60.1 kg)  08/01/15 137 lb 12.8 oz (62.5 kg)  07/16/15 131 lb 6.4 oz (59.6 kg)  07/04/15 125 lb 12.8 oz (57.1 kg)  06/14/15 128 lb 12 oz (58.4 kg)  05/23/15 117 lb 9.6 oz (53.3 kg)  03/06/15 112 lb 8 oz (51 kg)  02/28/15 113 lb (51.3 kg)  02/07/15 114 lb 10.2 oz (52 kg)    Body mass index is 21.37 kg/m. Patient meets criteria for normal weight for height based on current BMI. Pt reports no recent weight changes. Per chart, pt's has remained stable over the past year.   No current diet order in. Patient reports consuming approximately 100% of meals at this time. Pt reports consuming a [place] sandwich, roll and crackers yesterday. Pt has chickfila at bedside pt states she is hungry and about to eat it. Pt reports good PO intake and no N/V PTA.   Labs and medications reviewed.   No nutrition interventions warranted at this time. If nutrition issues arise, please consult RD.   Par[place] Ranger, MS, RDN, LDN 05/09/2017 1:51 PM

## 2017-05-09 NOTE — Progress Notes (Signed)
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 Hb SS with crisis: Continue PCA for PRN use. Schedule Dilaudid 2 mg. COntinue Toradol and decrease IVF to 75 ml/hr.  2. Migraine Headache: Continue Toradol. 3. Anemia of Chronic Disease: Hb Stable but LDH elevated. Will recheck Hb tomorrow. Reticulocytes robust.    Code Status: Full Code Family Communication: N/A Disposition Plan: Not yet ready for discharge  [place].  Pager [phone]. If 7PM-7AM, please contact night-coverage.  05/09/2017, 4:30 PM  LOS: 1 day   Interim History: Pt reports that she has been following with Dr. Lanell Persons at [facility] with last visit in February, when she was prescribed Dilaudid 2 mg q 4 hours PRN. I reviewed the controlled substance database and was unable to find any prescribed Dilaudid. I am unable to access her records from [facility]. She reports her pain as 5/10 and localized to her low back. She states that pain is improving, however she also has a headache that she describes as located in the Temporal region and characteristic of her migraines. She denies any previous strokes. She has not used any specific headache medications in the past.    Consultants:  None  Procedures:  None  Antibiotics:  None   Objective: Vitals:   05/09/17 0800 05/09/17 1038 05/09/17 1325 05/09/17 1407  BP:  121/79 101/63   Pulse:  86 80   Resp: 17 16 16 16   Temp:  98.4 F (36.9 C)    TempSrc:  Oral    SpO2:  100% 99% 100%  Weight:      Height:       Weight change:   Intake/Output Summary (Last 24 hours) at 05/09/17 1630 Last data filed at 05/09/17 1040  Gross per 24 hour  Intake          1563.67 ml  Output                0 ml  Net           1563.67 ml      Physical Exam General: Alert, awake, oriented x3, in no acute distress.  HEENT: [place]/AT PEERL, EOMI, anicteric. Neck: Trachea midline,  no masses, no thyromegal,y no JVD, no carotid bruit OROPHARYNX:  Moist, No exudate/ erythema/lesions.  Heart: Regular rate and rhythm, without murmurs, rubs, gallops, PMI non-displaced, no heaves or thrills on palpation.  Lungs: Clear to auscultation, no wheezing or rhonchi noted. No increased vocal fremitus resonant to percussion  Abdomen: Soft, nontender, nondistended, positive bowel sounds, no masses no hepatosplenomegaly noted.  Neuro: No focal neurological deficits noted cranial nerves II through XII grossly intact. Strength at baseline in bilateral upper and lower extremities. Musculoskeletal: No warmth swelling or erythema around joints, no spinal tenderness noted. Psychiatric: Patient alert and oriented x3, good insight and cognition, good recent to remote recall.   Data Reviewed: Basic Metabolic Panel:  Recent Labs Lab 05/08/17 1551  NA 140  K 3.8  CL 108  CO2 25  GLUCOSE 105*  BUN 7  CREATININE 0.31*  CALCIUM 9.1   Liver Function Tests:  Recent Labs Lab 05/08/17 1551  AST 42*  ALT 16  ALKPHOS 67  BILITOT 6.2*  PROT 7.9  ALBUMIN 4.6   No results for input(s): LIPASE, AMYLASE  in the last 168 hours. No results for input(s): AMMONIA in the last 168 hours. CBC:  Recent Labs Lab 05/08/17 1551  WBC 9.2  NEUTROABS 3.8  HGB 8.1*  HCT 22.3*  MCV 96.5  PLT 469*   Cardiac Enzymes: No results for input(s): CKTOTAL, CKMB, CKMBINDEX, TROPONINI in the last 168 hours. BNP (last 3 results) No results for input(s): BNP in the last 8760 hours.  ProBNP (last 3 results) No results for input(s): PROBNP in the last 8760 hours.  CBG: No results for input(s): GLUCAP in the last 168 hours.  No results found for this or any previous visit (from the past 240 hour(s)).   Studies: Dg Chest Port 1 View  Result  Date: 04/18/2017 CLINICAL DATA:  Sickle cell crisis, onset at 08:00 today. Back and chest pain. Dyspnea. EXAM: PORTABLE CHEST 1 VIEW COMPARISON:  03/26/2016 FINDINGS: Unchanged cardiomegaly. The lungs are clear. The pulmonary vasculature is normal. There is no pleural effusion. There is no pneumothorax. Mediastinal and hilar contours are unremarkable and unchanged. IMPRESSION: Stable cardiomegaly.  No acute findings. Electronically Signed   By: Andreas Newport M.D.   On: 04/18/2017 21:07    Scheduled Meds: . enoxaparin (LOVENOX) injection  40 mg Subcutaneous QHS  . folic acid  1 mg Oral Daily  . HYDROmorphone   Intravenous Q4H  . hydroxyurea  1,000 mg Oral Daily  . ketorolac  15 mg Intravenous Q6H  . senna-docusate  1 tablet Oral BID   Continuous Infusions: . 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 Greater than 50% involved face to face contact with the patient for assessment, counseling and coordination of care.

## 2017-05-10 DIAGNOSIS — D599 Acquired hemolytic anemia, unspecified: Secondary | ICD-10-CM

## 2017-05-10 DIAGNOSIS — K5900 Constipation, unspecified: Secondary | ICD-10-CM

## 2017-05-10 LAB — CBC WITH DIFFERENTIAL/PLATELET
BASOS PCT: 0 %
Basophils Absolute: 0 10*3/uL (ref 0.0–0.1)
EOS PCT: 1 %
Eosinophils Absolute: 0.1 10*3/uL (ref 0.0–0.7)
HEMATOCRIT: 20.4 % — AB (ref 36.0–46.0)
Hemoglobin: 7.3 g/dL — ABNORMAL LOW (ref 12.0–15.0)
LYMPHS ABS: 3.3 10*3/uL (ref 0.7–4.0)
Lymphocytes Relative: 23 %
MCH: 34.9 pg — ABNORMAL HIGH (ref 26.0–34.0)
MCHC: 35.8 g/dL (ref 30.0–36.0)
MCV: 97.6 fL (ref 78.0–100.0)
Monocytes Absolute: 1.2 10*3/uL — ABNORMAL HIGH (ref 0.1–1.0)
Monocytes Relative: 8 %
NEUTROS ABS: 9.9 10*3/uL — AB (ref 1.7–7.7)
Neutrophils Relative %: 68 %
Platelets: 373 10*3/uL (ref 150–400)
RBC: 2.09 MIL/uL — ABNORMAL LOW (ref 3.87–5.11)
RDW: 18.8 % — AB (ref 11.5–15.5)
WBC: 14.5 10*3/uL — AB (ref 4.0–10.5)
nRBC: 3 /100 WBC — ABNORMAL HIGH

## 2017-05-10 LAB — RETICULOCYTES
RBC.: 2.09 MIL/uL — ABNORMAL LOW (ref 3.87–5.11)
Retic Count, Absolute: 390.8 10*3/uL — ABNORMAL HIGH (ref 19.0–186.0)
Retic Ct Pct: 18.7 % — ABNORMAL HIGH (ref 0.4–3.1)

## 2017-05-10 LAB — HIV ANTIBODY (ROUTINE TESTING W REFLEX): HIV Screen 4th Generation wRfx: NONREACTIVE

## 2017-05-10 MED ORDER — HYDROMORPHONE HCL 2 MG PO TABS
3.0000 mg | ORAL_TABLET | Freq: Four times a day (QID) | ORAL | Status: DC
  Administered 2017-05-10 – 2017-05-11 (×4): 3 mg via ORAL
  Filled 2017-05-10 (×5): qty 2

## 2017-05-10 MED ORDER — FOLIC ACID 1 MG PO TABS
2.0000 mg | ORAL_TABLET | Freq: Every day | ORAL | Status: DC
  Administered 2017-05-11 – 2017-05-14 (×4): 2 mg via ORAL
  Filled 2017-05-10 (×4): qty 2

## 2017-05-10 NOTE — Progress Notes (Signed)
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 Hb SS with crisis: Continue PCA for PRN use. Increase oral Dilaudid to 3 mg every 6 hours. COntinue Toradol and decrease IVF to [facility] 2. Migraine Headache: Improved with intermittent as its associated with use of IV Dilaudid. Continue Toradol. 3. Anemia of Chronic Disease: Hb Stable but LDH elevated. Will recheck Hb and LDH tomorrow. Reticulocytes robust. Increase folic acid to 2 mg 4. Constipation: Patient receiving senna S and has MiraLAX to be used as needed. I've encouraged patient to request.   Code Status: Full Code Family Communication: N/A Disposition Plan: Not yet ready for discharge  [place].  Pager [phone]. If 7PM-7AM, please contact night-coverage.  05/10/2017, 5:16 PM  LOS: 2 days   Interim History: Pt reports that the pain in her legs have resolved she only has pain in her back a level of 5 out of 10. Patient reports that she has a prescription for hydroxyurea but has not started taking it as she did not have the money to purchase it. She hopes that obtain at this weakening it started taking it. Last bowel movement was 3 days ago.  Consultants:  None  Procedures:  None  Antibiotics:  None   Objective: Vitals:   05/10/17 1513 05/10/17 1627 05/10/17 1713 05/10/17 1714  BP: 117/68     Pulse: 90  (!) 109 97  Resp: 13 (!) 22    Temp: (!) 100.4 F (38 C)     TempSrc: Oral     SpO2: 98% 98% 96% 99%  Weight:      Height:       Weight change: 2.759 kg (6 lb 1.3 oz)  Intake/Output Summary (Last 24 hours) at 05/10/17 1716 Last data filed at 05/10/17 0304  Gross per 24 hour  Intake              600 ml  Output                0 ml  Net               600 ml      Physical Exam General: Alert, awake, oriented x3, in no Mildly acute distress.  HEENT: /AT PEERL, EOMI, mild icterus.  Heart: Regular rate and rhythm, without murmurs, rubs, gallops, PMI non-displaced, no heaves or thrills on palpation.  Lungs: Clear to auscultation, no wheezing or rhonchi noted. No increased vocal fremitus resonant to percussion  Abdomen: Soft, nontender, nondistended, positive bowel sounds, no masses no hepatosplenomegaly noted.  Neuro: No focal neurological deficits noted cranial nerves II through XII grossly intact. Strength at baseline in bilateral upper and lower extremities. Musculoskeletal: No warmth swelling or erythema around joints, no spinal tenderness noted. Psychiatric: Patient alert and oriented x3, good insight and cognition, good recent to remote recall.   Data Reviewed: Basic Metabolic Panel:  Recent Labs Lab 05/08/17 1551  NA 140  K 3.8  CL 108  CO2 25  GLUCOSE 105*  BUN 7  CREATININE 0.31*  CALCIUM 9.1   Liver Function Tests:  Recent Labs Lab 05/08/17 1551  AST 42*  ALT 16  ALKPHOS 67  BILITOT 6.2*  PROT 7.9  ALBUMIN 4.6   No results for input(s): LIPASE, AMYLASE in the  last 168 hours. No results for input(s): AMMONIA in the last 168 hours. CBC:  Recent Labs Lab 05/08/17 1551 05/10/17 0418  WBC 9.2 14.5*  NEUTROABS 3.8 9.9*  HGB 8.1* 7.3*  HCT 22.3* 20.4*  MCV 96.5 97.6  PLT 469* 373   Cardiac Enzymes: No results for input(s): CKTOTAL, CKMB, CKMBINDEX, TROPONINI in the last 168 hours. BNP (last 3 results) No results for input(s): BNP in the last 8760 hours.  ProBNP (last 3 results) No results for input(s): PROBNP in the last 8760 hours.  CBG: No results for input(s): GLUCAP in the last 168 hours.  No results found for this or any previous visit (from the past 240 hour(s)).   Studies: Dg Chest Port 1 View  Result Date: 04/18/2017 CLINICAL DATA:  Sickle cell crisis, onset at 08:00 today. Back  and chest pain. Dyspnea. EXAM: PORTABLE CHEST 1 VIEW COMPARISON:  03/26/2016 FINDINGS: Unchanged cardiomegaly. The lungs are clear. The pulmonary vasculature is normal. There is no pleural effusion. There is no pneumothorax. Mediastinal and hilar contours are unremarkable and unchanged. IMPRESSION: Stable cardiomegaly.  No acute findings. Electronically Signed   By: Andreas Newport M.D.   On: 04/18/2017 21:07    Scheduled Meds: . enoxaparin (LOVENOX) injection  40 mg Subcutaneous QHS  . folic acid  1 mg Oral Daily  . HYDROmorphone   Intravenous Q4H  . HYDROmorphone  3 mg Oral Q6H  . hydroxyurea  1,000 mg Oral Daily  . ketorolac  15 mg Intravenous Q6H  . 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 Greater than 50% involved face to face contact with the patient for assessment, counseling and coordination of care.

## 2017-05-10 NOTE — Progress Notes (Signed)
Titrated O2 to 1 Lpm, O2 sats to 94%.

## 2017-05-11 DIAGNOSIS — R509 Fever, unspecified: Secondary | ICD-10-CM

## 2017-05-11 MED ORDER — NALOXONE HCL 0.4 MG/ML IJ SOLN: 0.4000 mg | INTRAMUSCULAR | Status: DC | PRN

## 2017-05-11 MED ORDER — HYDROMORPHONE 1 MG/ML IV SOLN
INTRAVENOUS | Status: DC
  Administered 2017-05-11: 2.5 mg via INTRAVENOUS
  Administered 2017-05-12: 4.5 mg via INTRAVENOUS
  Administered 2017-05-12: 1.5 mg via INTRAVENOUS
  Administered 2017-05-12: 2.5 mg via INTRAVENOUS
  Administered 2017-05-12 (×2): 1 mg via INTRAVENOUS
  Administered 2017-05-12: 2 mg via INTRAVENOUS
  Administered 2017-05-13: 5.5 mg via INTRAVENOUS
  Administered 2017-05-13: 6.5 mg via INTRAVENOUS
  Administered 2017-05-13: 1.5 mg via INTRAVENOUS
  Administered 2017-05-13 (×2): 4 mg via INTRAVENOUS
  Administered 2017-05-13: 4.5 mg via INTRAVENOUS
  Administered 2017-05-13: 20:00:00 via INTRAVENOUS
  Administered 2017-05-14: 4 mg via INTRAVENOUS
  Administered 2017-05-14: 3 mg via INTRAVENOUS
  Administered 2017-05-14: 1.5 mg via INTRAVENOUS
  Administered 2017-05-14: 4 mg via INTRAVENOUS
  Filled 2017-05-11 (×2): qty 25

## 2017-05-11 MED ORDER — ACETAMINOPHEN 325 MG PO TABS
650.0000 mg | ORAL_TABLET | ORAL | Status: DC | PRN
  Administered 2017-05-12 (×2): 650 mg via ORAL
  Filled 2017-05-11 (×2): qty 2

## 2017-05-11 MED ORDER — DEXTROSE-NACL 5-0.45 % IV SOLN
INTRAVENOUS | Status: DC
  Administered 2017-05-11 – 2017-05-13 (×3): via INTRAVENOUS

## 2017-05-11 MED ORDER — HYDROMORPHONE HCL 2 MG PO TABS
3.0000 mg | ORAL_TABLET | ORAL | Status: DC
  Administered 2017-05-11 – 2017-05-13 (×9): 3 mg via ORAL
  Filled 2017-05-11 (×10): qty 2

## 2017-05-11 MED ORDER — SODIUM CHLORIDE 0.9% FLUSH: 9.0000 mL | INTRAVENOUS | Status: DC | PRN

## 2017-05-11 MED ORDER — ONDANSETRON HCL 4 MG/2ML IJ SOLN
4.0000 mg | Freq: Four times a day (QID) | INTRAMUSCULAR | Status: DC | PRN
  Administered 2017-05-11 – 2017-05-13 (×2): 4 mg via INTRAVENOUS
  Filled 2017-05-11 (×2): qty 2

## 2017-05-11 NOTE — Progress Notes (Signed)
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 Fever: Pt had a low grade fever overnight. Will check urinalysis. No overt signs of infection. Will observe without antibiotics as the fever resolved without intervention and may be a feature of the sickle cell crisis.  2. Hb SS with crisis: Change settings on PCA to bolus if 0.5, lockout of 10 minutes and 1 hour limit of 3 mg. Increase frequency of oral Dilaudid to  every 4 hours. Continue Toradol.  3. Migraine Headache: Improved. It is  intermittent and associated with use of IV Dilaudid. Continue Toradol. 4. Anemia of Chronic Disease: Hb was decreased yesterday and patient refused labs today. Clinically it appears that there is still hemolysis occurring. Continue folic acid to 2 mg.Will defer labs to tomorrow as she is hemodynamically stable.  5. Constipation: Patient receiving senna S and has MiraLAX to be used as needed. I've encouraged patient to request.   Code Status: Full Code Family Communication: N/A Disposition Plan: Not yet ready for discharge  [place].  Pager [phone]. If 7PM-7AM, please contact night-coverage.  05/11/2017, 2:21 PM  LOS: 3 days   Interim History: Pt reports that the pain in her back increased overnight to a level of 6/10. She has had very little use of the PCA stating that it affords almost no relief at the [place] settings. She has used 5.4 mg of Dilaudid with 18/18:demands/deliveries in the last 24 hours. Patient reports that she had a fever last night and had sweating most of the night until the fever resolved as she did not receive any antipyretics. She reports that she has been sleeping a lot since then  Last bowel movement was 4 days  ago.  Consultants:  None  Procedures:  None  Antibiotics:  None   Objective: Vitals:   05/11/17 0523 05/11/17 0814 05/11/17 0951 05/11/17 1311  BP: 120/67  101/60   Pulse: (!) 102  (!) 104   Resp: 16 (!) 25 (!) 25 14  Temp: 99 F (37.2 C)  99.8 F (37.7 C)   TempSrc: Oral  Oral   SpO2: 98% 98% 99% 97%  Weight:      Height:       Weight change: 1 kg (2 lb 3.3 oz)  Intake/Output Summary (Last 24 hours) at 05/11/17 1421 Last data filed at 05/10/17 1930  Gross per 24 hour  Intake              240 ml  Output                0 ml  Net              240 ml      Physical Exam General: Alert, awake, oriented x3, in mildly acute distress.  HEENT: [place]/AT PEERL, EOMI, mild icterus but more intense than yesterday.  Oropharynx: No exudate, erythema or lesions noted.  Heart: Regular rate and rhythm, without murmurs, rubs, gallops, PMI non-displaced, no heaves or thrills on palpation.  Lungs: Clear to auscultation, no wheezing or rhonchi noted. No increased vocal fremitus resonant to percussion  Abdomen: Soft, nontender, nondistended, positive bowel sounds, no masses no hepatosplenomegaly noted.  Neuro: No focal neurological deficits noted cranial nerves II through XII grossly intact. Strength at baseline  in bilateral upper and lower extremities. Musculoskeletal: No warmth swelling or erythema around joints, no spinal tenderness noted. Psychiatric: Patient alert and oriented x3, good insight and cognition, good recent to remote recall.   Data Reviewed: Basic Metabolic Panel:  Recent Labs Lab 05/08/17 1551  NA 140  K 3.8  CL 108  CO2 25  GLUCOSE 105*  BUN 7  CREATININE 0.31*  CALCIUM 9.1   Liver Function Tests:  Recent Labs Lab 05/08/17 1551  AST 42*  ALT 16  ALKPHOS 67  BILITOT 6.2*  PROT 7.9  ALBUMIN 4.6   No results for input(s): LIPASE, AMYLASE in the last 168 hours. No results for input(s): AMMONIA in the last 168 hours. CBC:  Recent Labs Lab  05/08/17 1551 05/10/17 0418  WBC 9.2 14.5*  NEUTROABS 3.8 9.9*  HGB 8.1* 7.3*  HCT 22.3* 20.4*  MCV 96.5 97.6  PLT 469* 373   Cardiac Enzymes: No results for input(s): CKTOTAL, CKMB, CKMBINDEX, TROPONINI in the last 168 hours. BNP (last 3 results) No results for input(s): BNP in the last 8760 hours.  ProBNP (last 3 results) No results for input(s): PROBNP in the last 8760 hours.  CBG: No results for input(s): GLUCAP in the last 168 hours.  No results found for this or any previous visit (from the past 240 hour(s)).   Studies: Dg Chest Port 1 View  Result Date: 04/18/2017 CLINICAL DATA:  Sickle cell crisis, onset at 08:00 today. Back and chest pain. Dyspnea. EXAM: PORTABLE CHEST 1 VIEW COMPARISON:  03/26/2016 FINDINGS: Unchanged cardiomegaly. The lungs are clear. The pulmonary vasculature is normal. There is no pleural effusion. There is no pneumothorax. Mediastinal and hilar contours are unremarkable and unchanged. IMPRESSION: Stable cardiomegaly.  No acute findings. Electronically Signed   By: Andreas Newport M.D.   On: 04/18/2017 21:07    Scheduled Meds: . enoxaparin (LOVENOX) injection  40 mg Subcutaneous QHS  . folic acid  2 mg Oral Daily  . HYDROmorphone   Intravenous Q4H  . HYDROmorphone  3 mg Oral Q4H  . hydroxyurea  1,000 mg Oral Daily  . senna-docusate  1 tablet Oral BID   Continuous Infusions: . 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 Greater than 50% involved face to face contact with the patient for assessment, counseling and coordination of care.

## 2017-05-12 ENCOUNTER — Inpatient Hospital Stay (HOSPITAL_COMMUNITY): Payer: Self-pay

## 2017-05-12 LAB — CBC WITH DIFFERENTIAL/PLATELET
Basophils Absolute: 0 10*3/uL (ref 0.0–0.1)
Basophils Relative: 0 %
EOS PCT: 0 %
Eosinophils Absolute: 0 10*3/uL (ref 0.0–0.7)
HEMATOCRIT: 16.9 % — AB (ref 36.0–46.0)
Hemoglobin: 6.3 g/dL — CL (ref 12.0–15.0)
LYMPHS ABS: 3.4 10*3/uL (ref 0.7–4.0)
Lymphocytes Relative: 17 %
MCH: 35.6 pg — AB (ref 26.0–34.0)
MCHC: 37.3 g/dL — AB (ref 30.0–36.0)
MCV: 95.5 fL (ref 78.0–100.0)
Monocytes Absolute: 2.2 10*3/uL — ABNORMAL HIGH (ref 0.1–1.0)
Monocytes Relative: 11 %
NEUTROS ABS: 14.2 10*3/uL — AB (ref 1.7–7.7)
NRBC: 5 /100{WBCs} — AB
Neutrophils Relative %: 72 %
Platelets: 271 10*3/uL (ref 150–400)
RBC: 1.77 MIL/uL — AB (ref 3.87–5.11)
RDW: 21.1 % — AB (ref 11.5–15.5)
WBC: 19.8 10*3/uL — AB (ref 4.0–10.5)

## 2017-05-12 LAB — BASIC METABOLIC PANEL
ANION GAP: 7 (ref 5–15)
BUN: <5 mg/dL — ABNORMAL LOW (ref 6–20)
CALCIUM: 8.5 mg/dL — AB (ref 8.9–10.3)
CHLORIDE: 103 mmol/L (ref 101–111)
CO2: 24 mmol/L (ref 22–32)
Creatinine, Ser: <0.3 mg/dL — ABNORMAL LOW (ref 0.44–1.00)
GLUCOSE: 116 mg/dL — AB (ref 65–99)
POTASSIUM: 3.3 mmol/L — AB (ref 3.5–5.1)
Sodium: 134 mmol/L — ABNORMAL LOW (ref 135–145)

## 2017-05-12 LAB — LACTATE DEHYDROGENASE: LDH: 437 U/L — ABNORMAL HIGH (ref 98–192)

## 2017-05-12 LAB — PREPARE RBC (CROSSMATCH)

## 2017-05-12 LAB — RETICULOCYTES
RBC.: 1.77 MIL/uL — AB (ref 3.87–5.11)
Retic Ct Pct: >23 % — ABNORMAL HIGH (ref 0.4–3.1)

## 2017-05-12 LAB — ABO/RH: ABO/RH(D): O POS

## 2017-05-12 MED ORDER — SODIUM CHLORIDE 0.9 % IV SOLN: Freq: Once | INTRAVENOUS | Status: DC

## 2017-05-12 NOTE — Progress Notes (Signed)
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 Fever: Pt continues to have a fever without overt evidence of infection. Urinalysis negative. CXR pending. Will continue to observe without antibiotics and this may be a feature of the sickle cell crisis.  2. Hb SS with crisis: Continue settings on PCA at bolus if 0.5, lockout of 10 minutes and 1 hour limit of 3 mg. Continue frequency of oral Dilaudid at  every 4 hours.  Toradol will be completed today.  3. Acute Hemolytic Anemia in setting of Anemia of Chronic Disease: Pt receiving transfusion today as blood was delayed due to matching.  4. Migraine Headache: Improved. It is  intermittent and associated with use of IV Dilaudid. Continue Toradol.  5. Constipation: Patient receiving senna S and has MiraLAX to be used as needed. I've encouraged patient to request.   Code Status: Full Code Family Communication: N/A Disposition Plan: Not yet ready for discharge  [employer].  Pager [phone]. If 7PM-7AM, please contact night-coverage.  05/12/2017, 5:58 PM  LOS: 4 days   Interim History: Pt reports that the pain in her back improved with new settings on PCA.  She has used 18 mg of Dilaudid with 44/37:demands/deliveries in the last 24 hours. Patient  had a fever of 101 today. Pt still has not had a BM and is refusing any laxatives.   Consultants:  None  Procedures:  None  Antibiotics:  None   Objective: Vitals:   05/12/17 1456 05/12/17 1525 05/12/17 1554 05/12/17 1605  BP: 106/62 102/64  102/63  Pulse: 91 87  88  Resp: 14 16 16    Temp: 99.1 F (37.3 C) 98.2 F (36.8 C)  99 F (37.2 C)  TempSrc: Oral Oral  Oral  SpO2: 97% 95% 95% 96%  Weight:      Height:        Weight change: -0.391 kg (-13.8 oz)  Intake/Output Summary (Last 24 hours) at 05/12/17 1758 Last data filed at 05/12/17 1456  Gross per 24 hour  Intake              840 ml  Output                0 ml  Net              840 ml      Physical Exam General: Alert, awake, oriented x3, in mildly acute distress.  HEENT: [place]/AT PEERL, EOMI, mild icterus but is less than yesterday.  Oropharynx: No exudate, erythema or lesions noted.  Heart: Regular rate and rhythm, without murmurs, rubs, gallops, PMI non-displaced, no heaves or thrills on palpation.  Lungs: Clear to auscultation, no wheezing or rhonchi noted. No increased vocal fremitus resonant to percussion  Abdomen: Soft, nontender, nondistended, positive bowel sounds, no masses no hepatosplenomegaly noted.  Neuro: No focal neurological deficits noted cranial nerves II through XII grossly intact. Strength at baseline in bilateral upper and lower extremities. Musculoskeletal: No warmth swelling or erythema around joints, no spinal tenderness noted. Psychiatric: Patient alert and oriented x3, good insight and cognition, good recent to remote recall.   Data Reviewed: Basic Metabolic Panel:  Recent Labs Lab 05/08/17 1551 05/12/17 0426  NA 140 134*  K 3.8 3.3*  CL 108 103  CO2  25 24  GLUCOSE 105* 116*  BUN 7 <5*  CREATININE 0.31* <0.30*  CALCIUM 9.1 8.5*   Liver Function Tests:  Recent Labs Lab 05/08/17 1551  AST 42*  ALT 16  ALKPHOS 67  BILITOT 6.2*  PROT 7.9  ALBUMIN 4.6   No results for input(s): LIPASE, AMYLASE in the last 168 hours. No results for input(s): AMMONIA in the last 168 hours. CBC:  Recent Labs Lab 05/08/17 1551 05/10/17 0418 05/12/17 0426  WBC 9.2 14.5* 19.8*  NEUTROABS 3.8 9.9* 14.2*  HGB 8.1* 7.3* 6.3*  HCT 22.3* 20.4* 16.9*  MCV 96.5 97.6 95.5  PLT 469* 373 271   Cardiac Enzymes: No results for input(s): CKTOTAL, CKMB, CKMBINDEX, TROPONINI in the last 168 hours. BNP (last 3 results) No  results for input(s): BNP in the last 8760 hours.  ProBNP (last 3 results) No results for input(s): PROBNP in the last 8760 hours.  CBG: No results for input(s): GLUCAP in the last 168 hours.  No results found for this or any previous visit (from the past 240 hour(s)).   Studies: Dg Chest Port 1 View  Result Date: 04/18/2017 CLINICAL DATA:  Sickle cell crisis, onset at 08:00 today. Back and chest pain. Dyspnea. EXAM: PORTABLE CHEST 1 VIEW COMPARISON:  03/26/2016 FINDINGS: Unchanged cardiomegaly. The lungs are clear. The pulmonary vasculature is normal. There is no pleural effusion. There is no pneumothorax. Mediastinal and hilar contours are unremarkable and unchanged. IMPRESSION: Stable cardiomegaly.  No acute findings. Electronically Signed   By: Andreas Newport M.D.   On: 04/18/2017 21:07    Scheduled Meds: . enoxaparin (LOVENOX) injection  40 mg Subcutaneous QHS  . folic acid  2 mg Oral Daily  . HYDROmorphone   Intravenous Q4H  . HYDROmorphone  3 mg Oral Q4H  . hydroxyurea  1,000 mg Oral Daily  . senna-docusate  1 tablet Oral BID   Continuous Infusions: . sodium chloride    . 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 Greater than 50% involved face to face contact with the patient for assessment, counseling and coordination of care.

## 2017-05-12 NOTE — Progress Notes (Signed)
Dr. Raliegh Ip schorr notified of critical Hgb of 6.3

## 2017-05-12 NOTE — Progress Notes (Signed)
Per Dr Zigmund Daniel give PRBC despite patient having temperature. Will continue to monitor.

## 2017-05-13 DIAGNOSIS — R42 Dizziness and giddiness: Secondary | ICD-10-CM

## 2017-05-13 DIAGNOSIS — J209 Acute bronchitis, unspecified: Secondary | ICD-10-CM

## 2017-05-13 LAB — CBC WITH DIFFERENTIAL/PLATELET
Basophils Absolute: 0 10*3/uL (ref 0.0–0.1)
Basophils Relative: 0 %
EOS ABS: 0.1 10*3/uL (ref 0.0–0.7)
Eosinophils Relative: 1 %
HCT: 23.3 % — ABNORMAL LOW (ref 36.0–46.0)
HEMOGLOBIN: 8.6 g/dL — AB (ref 12.0–15.0)
LYMPHS ABS: 2.6 10*3/uL (ref 0.7–4.0)
Lymphocytes Relative: 20 %
MCH: 34.1 pg — AB (ref 26.0–34.0)
MCHC: 36.9 g/dL — AB (ref 30.0–36.0)
MCV: 92.5 fL (ref 78.0–100.0)
MONOS PCT: 13 %
Monocytes Absolute: 1.7 10*3/uL — ABNORMAL HIGH (ref 0.1–1.0)
NEUTROS ABS: 8.4 10*3/uL — AB (ref 1.7–7.7)
NRBC: 10 /100{WBCs} — AB
Neutrophils Relative %: 66 %
Platelets: 305 10*3/uL (ref 150–400)
RBC: 2.52 MIL/uL — AB (ref 3.87–5.11)
RDW: 22.1 % — AB (ref 11.5–15.5)
WBC: 12.8 10*3/uL — AB (ref 4.0–10.5)

## 2017-05-13 LAB — TYPE AND SCREEN
ABO/RH(D): O POS
Antibody Screen: NEGATIVE
Donor AG Type: NEGATIVE
Unit division: 0

## 2017-05-13 LAB — BASIC METABOLIC PANEL
ANION GAP: 9 (ref 5–15)
BUN: <5 mg/dL — ABNORMAL LOW (ref 6–20)
CHLORIDE: 103 mmol/L (ref 101–111)
CO2: 27 mmol/L (ref 22–32)
Calcium: 8.4 mg/dL — ABNORMAL LOW (ref 8.9–10.3)
Creatinine, Ser: <0.3 mg/dL — ABNORMAL LOW (ref 0.44–1.00)
GLUCOSE: 103 mg/dL — AB (ref 65–99)
Potassium: 3.5 mmol/L (ref 3.5–5.1)
Sodium: 139 mmol/L (ref 135–145)

## 2017-05-13 LAB — BPAM RBC
Blood Product Expiration Date: 201810202359
ISSUE DATE / TIME: 201809271529
Unit Type and Rh: 5100

## 2017-05-13 LAB — RETICULOCYTES
RBC.: 2.52 MIL/uL — AB (ref 3.87–5.11)
RETIC CT PCT: 18.1 % — AB (ref 0.4–3.1)
Retic Count, Absolute: 456.1 10*3/uL — ABNORMAL HIGH (ref 19.0–186.0)

## 2017-05-13 LAB — LACTATE DEHYDROGENASE: LDH: 547 U/L — ABNORMAL HIGH (ref 98–192)

## 2017-05-13 MED ORDER — HYDROMORPHONE HCL 4 MG PO TABS
4.0000 mg | ORAL_TABLET | ORAL | Status: DC
Start: 2017-05-13 — End: 2017-05-14
  Administered 2017-05-13 – 2017-05-14 (×5): 4 mg via ORAL
  Filled 2017-05-13 (×6): qty 1

## 2017-05-13 MED ORDER — LACTULOSE 10 GM/15ML PO SOLN
20.0000 g | Freq: Once | ORAL | Status: AC
  Administered 2017-05-13: 20 g via ORAL
  Filled 2017-05-13: qty 30

## 2017-05-13 MED ORDER — DEXTROSE 5 % IV SOLN
500.0000 mg | Freq: Once | INTRAVENOUS | Status: AC
  Administered 2017-05-13: 500 mg via INTRAVENOUS
  Filled 2017-05-13: qty 500

## 2017-05-13 MED ORDER — AZITHROMYCIN 250 MG PO TABS
250.0000 mg | ORAL_TABLET | Freq: Every day | ORAL | Status: DC
  Administered 2017-05-14: 250 mg via ORAL
  Filled 2017-05-13: qty 1

## 2017-05-13 NOTE — Progress Notes (Signed)
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 Fever: No fevers in the last 24 hours. I suspect this is related to the sickle cell crisis and acute bronchitis. Will treat with Azithromycin.  2. Lightheadedness: Pt has been in the bed for the duration of her hospitalization and per nurses resistant to getting up in chair or walking. Will check orthostatics and also increase activity.  3. Hb SS with crisis: Wean PCA to bolus if 0.5, lockout of 15 minutes and 1 hour limit of 2 mg. Increase dose of oral Dilaudid to 4 mg and continue frequency of at  every 4 hours. Increase mobility.   4. Acute Hemolytic Anemia in setting of Anemia of Chronic Disease: Pt s/p transfusion of 1 unit RBC's. Awaiting labs from today.  5. Migraine Headache: Resolved.   6. Constipation: Patient receiving senna S and has been refusing laxatives. I have again discussed the importance of gut elimination in the inflammatory process and it's contribution to Pain. Pt agreeable to lactulose.                                        Code Status: Full Code Family Communication: N/A Disposition Plan: Not yet ready for discharge.   Jermichael Belmares A.  Pager [phone]. If 7PM-7AM, please contact night-coverage.  05/13/2017, 11:10 AM  LOS: 5 days   Interim History: Pt reports that the pain in her back is at an intensity of 5/10 which is where it has been despite adequate analgesics. She has used 21.5  mg of Dilaudid  On the PCA with 51/43:demands/deliveries in the last 24 hours. Patient  Has been coughing since yesterday and CXR shows findings consistent with Bronchitis. She still has not had a BM since admission.    HR 92 and Oxygen Saturation on RA 95% at rest. HR 103 and  Oxygen Saturation 95% on RA after ambulation. Pt c/o lightheadedness during ambulation.    Consultants:  None  Procedures:  None  Antibiotics:  None   Objective: Vitals:   05/13/17 0202 05/13/17 0448 05/13/17 0553 05/13/17 0819  BP: 110/67  109/66   Pulse: 97  84   Resp: 16  16 16   Temp: 99.4 F (37.4 C)  99.1 F (37.3 C)   TempSrc: Oral  Oral   SpO2: 97% 97% 98% 99%  Weight:   56.4 kg (124 lb 5.4 oz)   Height:       Weight change: -0.209 kg (-7.4 oz)  Intake/Output Summary (Last 24 hours) at 05/13/17 1110 Last data filed at 05/13/17 0553  Gross per 24 hour  Intake             1400 ml  Output             1200 ml  Net              200 ml      Physical Exam General: Alert, awake, oriented x3, in mildly acute distress.  HEENT: [place]/AT PEERL, EOMI, mild icterus but is less than yesterday.  Oropharynx: No exudate, erythema or lesions noted.  Heart: Regular rate and rhythm, without murmurs, rubs, gallops, PMI non-displaced, no heaves or thrills on  palpation.  Lungs: Clear to auscultation, no wheezing or rhonchi noted. No increased vocal fremitus resonant to percussion  Abdomen: Soft, nontender, nondistended, positive bowel sounds, no masses no hepatosplenomegaly noted.  Neuro: No focal neurological deficits noted cranial nerves II through XII grossly intact. Strength at baseline in bilateral upper and lower extremities. Musculoskeletal: No warmth swelling or erythema around joints, no spinal tenderness noted. Psychiatric: Patient alert and oriented x3, good insight and cognition, good recent to remote recall.   Data Reviewed: Basic Metabolic Panel:  Recent Labs Lab 05/08/17 1551 05/12/17 0426  NA 140 134*  K 3.8 3.3*  CL 108 103  CO2 25 24  GLUCOSE 105* 116*  BUN 7 <5*  CREATININE 0.31* <0.30*  CALCIUM 9.1 8.5*   Liver Function Tests:  Recent Labs Lab 05/08/17 1551  AST 42*  ALT 16  ALKPHOS 67  BILITOT 6.2*  PROT 7.9  ALBUMIN 4.6   No results  for input(s): LIPASE, AMYLASE in the last 168 hours. No results for input(s): AMMONIA in the last 168 hours. CBC:  Recent Labs Lab 05/08/17 1551 05/10/17 0418 05/12/17 0426  WBC 9.2 14.5* 19.8*  NEUTROABS 3.8 9.9* 14.2*  HGB 8.1* 7.3* 6.3*  HCT 22.3* 20.4* 16.9*  MCV 96.5 97.6 95.5  PLT 469* 373 271   Cardiac Enzymes: No results for input(s): CKTOTAL, CKMB, CKMBINDEX, TROPONINI in the last 168 hours. BNP (last 3 results) No results for input(s): BNP in the last 8760 hours.  ProBNP (last 3 results) No results for input(s): PROBNP in the last 8760 hours.  CBG: No results for input(s): GLUCAP in the last 168 hours.  No results found for this or any previous visit (from the past 240 hour(s)).   Studies: Dg Chest 2 View  Result Date: 05/12/2017 CLINICAL DATA:  Sickle cell crisis with fever. EXAM: CHEST  2 VIEW COMPARISON:  04/18/2017 FINDINGS: Lungs are adequately inflated without consolidation or effusion. Subtle prominence of the perihilar markings with peribronchial thickening. Cardiomediastinal silhouette, bones and soft tissues are within normal. IMPRESSION: Subtle prominence of the perihilar markings with peribronchial thickening which may be due to mild vascular congestion versus acute bronchitic process. Electronically Signed   By: Marin Olp M.D.   On: 05/12/2017 17:57   Dg Chest Port 1 View  Result Date: 04/18/2017 CLINICAL DATA:  Sickle cell crisis, onset at 08:00 today. Back and chest pain. Dyspnea. EXAM: PORTABLE CHEST 1 VIEW COMPARISON:  03/26/2016 FINDINGS: Unchanged cardiomegaly. The lungs are clear. The pulmonary vasculature is normal. There is no pleural effusion. There is no pneumothorax. Mediastinal and hilar contours are unremarkable and unchanged. IMPRESSION: Stable cardiomegaly.  No acute findings. Electronically Signed   By: Andreas Newport M.D.   On: 04/18/2017 21:07    Scheduled Meds: . enoxaparin (LOVENOX) injection  40 mg Subcutaneous QHS  . folic  acid  2 mg Oral Daily  . HYDROmorphone   Intravenous Q4H  . HYDROmorphone  3 mg Oral Q4H  . hydroxyurea  1,000 mg Oral Daily  . lactulose  20 g Oral Once  . senna-docusate  1 tablet Oral BID   Continuous Infusions: . sodium chloride    . 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 Greater than 50% involved face  to face contact with the patient for assessment, counseling and coordination of care.

## 2017-05-14 MED ORDER — AZITHROMYCIN 250 MG PO TABS: 250.0000 mg | ORAL_TABLET | Freq: Every day | ORAL | 0 refills | Status: AC

## 2017-05-14 NOTE — Progress Notes (Signed)
Patient discharged to home, all discharge medications and instructions reviewed and questions answered.  Patient to be assisted to vehicle by wheelchair.  

## 2017-05-14 NOTE — Discharge Summary (Signed)
Physician Discharge Summary  Patient ID: Gabriella Griffin MRN: [id number] DOB/AGE: [birth year] 25 y.o.  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 She has had IV Dilaudid PCA with some Toradol and IV fluids. Patient had pain at 10 out of 10 on admission but gradually got better. At the time of discharge pain was down to 4 out of 10. She had transient fever but was due to acute bronchitis. She was treated with azithromycin. Patient also had acute hemolytic anemia that required transfusion of one unit of packed red blood cells.She had constipation a migraine headaches that was concurrently treated she did have some lightheadedness during hospitalization suspected to be due to dehydration which also got better prior to discharge. At the time of discharge she was transitioned to home regimen I will follow with his PCP  Consults: None  Significant Diagnostic Studies: labs: CBCs and CMPs checked and HB dropped and radiology: CXR: normal  Treatments: IV hydration, antibiotics: azithromycin and analgesia: acetaminophen and Dilaudid  Discharge Exam: Blood pressure 100/61, pulse (!) 57, temperature 98.3 F (36.8 C), temperature source Oral, resp. rate 12, height 5\' 2"  (1.575 m), weight 56.2 kg (123 lb 14.4 oz), SpO2 99 %, unknown if currently breastfeeding. General appearance: alert, cooperative, appears stated age and no distress Head: Normocephalic, without obvious abnormality, atraumatic Back: symmetric, no curvature. ROM normal. No CVA tenderness. Chest wall: no tenderness Cardio: regular rate and rhythm, S1, S2 normal, no murmur, click, rub or gallop GI: soft, non-tender; bowel  sounds normal; no masses,  no organomegaly Extremities: extremities normal, atraumatic, no cyanosis or edema Pulses: 2+ and symmetric Skin: Skin color, texture, turgor normal. No rashes or lesions Neurologic: Grossly normal  Disposition: 01-Home or Self Care   Allergies as of 05/14/2017      Reactions   Ceftin [cefuroxime Axetil] Rash      Medication List    TAKE these medications   azithromycin 250 MG tablet Commonly known as:  ZITHROMAX Take 1 tablet (250 mg total) by mouth daily.   folic acid 1 MG tablet Commonly known as:  FOLVITE Take 2 tablets (2 mg total) by mouth daily. What changed:  how much to take   HYDROmorphone 2 MG tablet Commonly known as:  DILAUDID Take 1 tablet (2 mg total) by mouth every 4 (four) hours as needed for severe pain.   hydroxyurea 500 MG capsule Commonly known as:  HYDREA Take 1 capsule (500 mg total) by mouth daily. May take with food to minimize GI side effects. What changed:  how much to take  additional instructions   naproxen sodium 220 MG tablet Commonly known as:  ANAPROX Take 440 mg by mouth daily as needed (pain).            Discharge Care Instructions        Start     Ordered   05/14/17 0000  azithromycin (ZITHROMAX) 250 MG tablet  Daily     05/14/17 1212       Signed: GARBA,LAWAL 05/14/2017, 12:13 PM  Time spent 32 minutes

## 2017-05-14 NOTE — Care Management Note (Signed)
Case Management Note  Patient Details  Name: Gabriella Griffin MRN: [id number] Date of Birth: [birth year]  Subjective/Objective:     Sickle Cell Pain Crisis               Action/Plan: Discharge Planning: Provided pt with information on [facility] to call to follow up see PCP. Unit RN will give pt a coupon for Zithromax for [employer] for $12.91.    Expected Discharge Date:  05/14/17               Expected Discharge Plan:  Home/Self Care  In-House Referral:  NA  Discharge planning Services  CM Consult, Medication Assistance, [facility]  Post Acute Care Choice:  NA Choice offered to:  NA  DME Arranged:  N/A DME Agency:  NA  HH Arranged:  NA HH Agency:  NA  Status of Service:  Completed, signed off  If discussed at [place] of Stay Meetings, dates discussed:    Additional Comments:  Erenest Rasher, RN 05/14/2017, 3:56 PM

## 2017-05-17 LAB — CULTURE, BLOOD (ROUTINE X 2)
CULTURE: NO GROWTH
Culture: NO GROWTH
SPECIAL REQUESTS: ADEQUATE
SPECIAL REQUESTS: ADEQUATE

## 2017-06-24 ENCOUNTER — Inpatient Hospital Stay (HOSPITAL_COMMUNITY)
Admission: EM | Admit: 2017-06-24 | Discharge: 2017-06-29 | DRG: 812 | Disposition: A | Discharge status: Home / Self Care | Payer: Self-pay | Attending: Internal Medicine | Admitting: Internal Medicine

## 2017-06-24 ENCOUNTER — Emergency Department (HOSPITAL_COMMUNITY): Payer: Self-pay

## 2017-06-24 ENCOUNTER — Encounter (HOSPITAL_COMMUNITY): Payer: Self-pay | Admitting: Nurse Practitioner

## 2017-06-24 DIAGNOSIS — M545 Low back pain: Secondary | ICD-10-CM | POA: Diagnosis present

## 2017-06-24 DIAGNOSIS — D72829 Elevated white blood cell count, unspecified: Secondary | ICD-10-CM | POA: Diagnosis present

## 2017-06-24 DIAGNOSIS — D638 Anemia in other chronic diseases classified elsewhere: Secondary | ICD-10-CM | POA: Diagnosis present

## 2017-06-24 DIAGNOSIS — Z79899 Other long term (current) drug therapy: Secondary | ICD-10-CM

## 2017-06-24 DIAGNOSIS — D5700 Hb-SS disease with crisis, unspecified: Principal | ICD-10-CM | POA: Diagnosis present

## 2017-06-24 DIAGNOSIS — Z881 Allergy status to other antibiotic agents status: Secondary | ICD-10-CM

## 2017-06-24 DIAGNOSIS — Z23 Encounter for immunization: Secondary | ICD-10-CM

## 2017-06-24 DIAGNOSIS — E860 Dehydration: Secondary | ICD-10-CM | POA: Diagnosis present

## 2017-06-24 NOTE — ED Notes (Signed)
Bed: HW86 Expected date:  Expected time:  Means of arrival:  Comments: EMS 26 yo female sickle cell crisis/back and chest pain

## 2017-06-24 NOTE — ED Triage Notes (Signed)
Pt is c/o chest and back pain that she is attributing to a sickle cell crisis. States she took aleve all day without getting relief.

## 2017-06-25 ENCOUNTER — Encounter (HOSPITAL_COMMUNITY): Payer: Self-pay | Admitting: Emergency Medicine

## 2017-06-25 LAB — CBC WITH DIFFERENTIAL/PLATELET
Basophils Absolute: 0 10*3/uL (ref 0.0–0.1)
Basophils Relative: 0 %
EOS ABS: 0.1 10*3/uL (ref 0.0–0.7)
Eosinophils Relative: 1 %
HEMATOCRIT: 27.5 % — AB (ref 36.0–46.0)
Hemoglobin: 9.8 g/dL — ABNORMAL LOW (ref 12.0–15.0)
LYMPHS PCT: 16 %
Lymphs Abs: 1.8 10*3/uL (ref 0.7–4.0)
MCH: 34.4 pg — AB (ref 26.0–34.0)
MCHC: 35.6 g/dL (ref 30.0–36.0)
MCV: 96.5 fL (ref 78.0–100.0)
MONO ABS: 0.7 10*3/uL (ref 0.1–1.0)
MONOS PCT: 6 %
NEUTROS ABS: 8.8 10*3/uL — AB (ref 1.7–7.7)
NRBC: 8 /100{WBCs} — AB
Neutrophils Relative %: 77 %
PLATELETS: 447 10*3/uL — AB (ref 150–400)
RBC: 2.85 MIL/uL — AB (ref 3.87–5.11)
RDW: 21.5 % — AB (ref 11.5–15.5)
WBC: 11.4 10*3/uL — AB (ref 4.0–10.5)

## 2017-06-25 LAB — CBC
HCT: 20.9 % — ABNORMAL LOW (ref 36.0–46.0)
Hemoglobin: 7.5 g/dL — ABNORMAL LOW (ref 12.0–15.0)
MCH: 34.6 pg — ABNORMAL HIGH (ref 26.0–34.0)
MCHC: 35.9 g/dL (ref 30.0–36.0)
MCV: 96.3 fL (ref 78.0–100.0)
Platelets: 435 K/uL — ABNORMAL HIGH (ref 150–400)
RBC: 2.17 MIL/uL — ABNORMAL LOW (ref 3.87–5.11)
RDW: 21.1 % — ABNORMAL HIGH (ref 11.5–15.5)
WBC: 12.5 K/uL — ABNORMAL HIGH (ref 4.0–10.5)

## 2017-06-25 LAB — COMPREHENSIVE METABOLIC PANEL
ALK PHOS: 68 U/L (ref 38–126)
ALT: 16 U/L (ref 14–54)
ANION GAP: 9 (ref 5–15)
AST: 38 U/L (ref 15–41)
Albumin: 4.6 g/dL (ref 3.5–5.0)
BILIRUBIN TOTAL: 5.5 mg/dL — AB (ref 0.3–1.2)
BUN: 5 mg/dL — ABNORMAL LOW (ref 6–20)
CALCIUM: 8.8 mg/dL — AB (ref 8.9–10.3)
CO2: 19 mmol/L — ABNORMAL LOW (ref 22–32)
Chloride: 111 mmol/L (ref 101–111)
Creatinine, Ser: <0.3 mg/dL — ABNORMAL LOW (ref 0.44–1.00)
GLUCOSE: 96 mg/dL (ref 65–99)
POTASSIUM: 3.6 mmol/L (ref 3.5–5.1)
Sodium: 139 mmol/L (ref 135–145)
TOTAL PROTEIN: 7.6 g/dL (ref 6.5–8.1)

## 2017-06-25 LAB — CREATININE, SERUM: Creatinine, Ser: <0.3 mg/dL — ABNORMAL LOW (ref 0.44–1.00)

## 2017-06-25 LAB — LACTATE DEHYDROGENASE: LDH: 419 U/L — ABNORMAL HIGH (ref 98–192)

## 2017-06-25 LAB — RETICULOCYTES
RBC.: 2.85 MIL/uL — ABNORMAL LOW (ref 3.87–5.11)
RETIC COUNT ABSOLUTE: 538.7 10*3/uL — AB (ref 19.0–186.0)
Retic Ct Pct: 18.9 % — ABNORMAL HIGH (ref 0.4–3.1)

## 2017-06-25 LAB — I-STAT BETA HCG BLOOD, ED (MC, WL, AP ONLY): I-stat hCG, quantitative: <5 m[IU]/mL (ref <5)

## 2017-06-25 LAB — TROPONIN I

## 2017-06-25 MED ORDER — ENOXAPARIN SODIUM 40 MG/0.4ML ~~LOC~~ SOLN
40.0000 mg | SUBCUTANEOUS | Status: DC
  Administered 2017-06-25 – 2017-06-29 (×4): 40 mg via SUBCUTANEOUS
  Filled 2017-06-25 (×5): qty 0.4

## 2017-06-25 MED ORDER — HYDROMORPHONE HCL 2 MG/ML IJ SOLN
2.0000 mg | Freq: Once | INTRAMUSCULAR | Status: AC
  Administered 2017-06-25: 2 mg via INTRAVENOUS
  Filled 2017-06-25: qty 1

## 2017-06-25 MED ORDER — DIPHENHYDRAMINE HCL 50 MG/ML IJ SOLN: 12.5000 mg | Freq: Four times a day (QID) | INTRAMUSCULAR | Status: DC | PRN

## 2017-06-25 MED ORDER — MUSCLE RUB 10-15 % EX CREA
TOPICAL_CREAM | CUTANEOUS | Status: DC | PRN
  Filled 2017-06-25: qty 85

## 2017-06-25 MED ORDER — SODIUM CHLORIDE 0.9 % IV BOLUS (SEPSIS)
1000.0000 mL | Freq: Once | INTRAVENOUS | Status: AC
  Administered 2017-06-25: 1000 mL via INTRAVENOUS

## 2017-06-25 MED ORDER — SODIUM CHLORIDE 0.45 % IV SOLN
INTRAVENOUS | Status: AC
  Administered 2017-06-25 – 2017-06-26 (×3): via INTRAVENOUS

## 2017-06-25 MED ORDER — HYDROXYUREA 500 MG PO CAPS
1000.0000 mg | ORAL_CAPSULE | Freq: Every day | ORAL | Status: DC
  Administered 2017-06-25 – 2017-06-29 (×5): 1000 mg via ORAL
  Filled 2017-06-25 (×5): qty 2

## 2017-06-25 MED ORDER — INFLUENZA VAC SPLIT QUAD 0.5 ML IM SUSY
0.5000 mL | PREFILLED_SYRINGE | INTRAMUSCULAR | Status: AC
  Administered 2017-06-27: 0.5 mL via INTRAMUSCULAR
  Filled 2017-06-25: qty 0.5

## 2017-06-25 MED ORDER — DIPHENHYDRAMINE HCL 12.5 MG/5ML PO ELIX: 12.5000 mg | ORAL_SOLUTION | Freq: Four times a day (QID) | ORAL | Status: DC | PRN

## 2017-06-25 MED ORDER — NALOXONE HCL 0.4 MG/ML IJ SOLN
0.4000 mg | INTRAMUSCULAR | Status: DC | PRN
Start: 2017-06-25 — End: 2017-06-27

## 2017-06-25 MED ORDER — ONDANSETRON HCL 4 MG/2ML IJ SOLN
4.0000 mg | Freq: Once | INTRAMUSCULAR | Status: AC
  Administered 2017-06-25: 4 mg via INTRAVENOUS
  Filled 2017-06-25: qty 2

## 2017-06-25 MED ORDER — FOLIC ACID 1 MG PO TABS
1.0000 mg | ORAL_TABLET | Freq: Every day | ORAL | Status: DC
  Administered 2017-06-25 – 2017-06-29 (×5): 1 mg via ORAL
  Filled 2017-06-25 (×5): qty 1

## 2017-06-25 MED ORDER — HYDROMORPHONE HCL 2 MG/ML IJ SOLN: 2.0000 mg | Freq: Once | INTRAMUSCULAR | Status: DC

## 2017-06-25 MED ORDER — POLYETHYLENE GLYCOL 3350 17 G PO PACK: 17.0000 g | PACK | Freq: Every day | ORAL | Status: DC | PRN

## 2017-06-25 MED ORDER — SODIUM CHLORIDE 0.9% FLUSH: 9.0000 mL | INTRAVENOUS | Status: DC | PRN

## 2017-06-25 MED ORDER — HYDROMORPHONE 1 MG/ML IV SOLN
INTRAVENOUS | Status: DC
  Administered 2017-06-25: 04:00:00 via INTRAVENOUS
  Administered 2017-06-25: 1.5 mg via INTRAVENOUS
  Administered 2017-06-25: 2.1 mg via INTRAVENOUS
  Administered 2017-06-25: 0.6 mg via INTRAVENOUS
  Administered 2017-06-25: 2.3 mg via INTRAVENOUS
  Administered 2017-06-26: 3.6 mg via INTRAVENOUS
  Administered 2017-06-26: 2.7 mg via INTRAVENOUS
  Administered 2017-06-26: 0.3 mg via INTRAVENOUS
  Administered 2017-06-26: 2.7 mg via INTRAVENOUS
  Administered 2017-06-26: 0.9 mg via INTRAVENOUS
  Administered 2017-06-26: 3.3 mg via INTRAVENOUS
  Administered 2017-06-27: 2.4 mg via INTRAVENOUS
  Administered 2017-06-27: 01:00:00 via INTRAVENOUS
  Administered 2017-06-27: 1.8 mg via INTRAVENOUS
  Filled 2017-06-25 (×2): qty 25

## 2017-06-25 MED ORDER — ONDANSETRON HCL 4 MG/2ML IJ SOLN
4.0000 mg | Freq: Four times a day (QID) | INTRAMUSCULAR | Status: DC | PRN
  Administered 2017-06-26 (×2): 4 mg via INTRAVENOUS
  Filled 2017-06-25 (×2): qty 2

## 2017-06-25 MED ORDER — KETOROLAC TROMETHAMINE 15 MG/ML IJ SOLN
15.0000 mg | Freq: Four times a day (QID) | INTRAMUSCULAR | Status: AC
  Administered 2017-06-25 – 2017-06-26 (×5): 15 mg via INTRAVENOUS
  Filled 2017-06-25 (×5): qty 1

## 2017-06-25 MED ORDER — SENNOSIDES-DOCUSATE SODIUM 8.6-50 MG PO TABS
1.0000 | ORAL_TABLET | Freq: Two times a day (BID) | ORAL | Status: DC
  Administered 2017-06-27 – 2017-06-29 (×5): 1 via ORAL
  Filled 2017-06-25 (×8): qty 1

## 2017-06-25 NOTE — ED Provider Notes (Signed)
[facility] DEPT Provider Note: Georgena Spurling, MD, FACEP  CSN: [id number] MRN: [id number] ARRIVAL: 06/24/17 at [place]: [id number]   CHIEF COMPLAINT  Sickle Cell Pain Crisis   HISTORY OF PRESENT ILLNESS  06/25/17 12:32 AM Gabriella Griffin is a 26 y.o. female with sickle cell disease.  She complains of sickle cell pain that began yesterday morning.  The pain is located in her back and less so in her chest.  She states the pain in her chest is resolving but the pain in her back is an 8 out of 10.  She took Aleve yesterday morning but has no narcotics at home.  She has associated nausea but no vomiting, fever, diarrhea or shortness of breath.  She is also complaining of a painful hematoma to her right forehead.   Past Medical History:  Diagnosis Date  . Sickle cell anemia (HCC)     Past Surgical History:  Procedure Laterality Date  . CHOLECYSTECTOMY  2007    Family History  Problem Relation Age of Onset  . Sickle cell anemia Sister   . Diabetes Paternal Grandmother   . Sickle cell trait Mother   . Sickle cell trait Father     Social History   Tobacco Use  . Smoking status: Never Smoker  . Smokeless tobacco: Never Used  Substance Use Topics  . Alcohol use: No  . Drug use: No    Prior to Admission medications   Medication Sig Start Date End Date Taking? Authorizing Provider  folic acid (FOLVITE) 1 MG tablet Take 2 tablets (2 mg total) by mouth daily. Patient taking differently: Take 1 mg by mouth daily.  06/16/15  Yes [name], MD  HYDROmorphone (DILAUDID) 2 MG tablet Take 1 tablet (2 mg total) by mouth every 4 (four) hours as needed for severe pain. 08/27/15  Yes Woodroe Mode, MD  hydroxyurea (HYDREA) 500 MG capsule Take 1 capsule (500 mg total) by mouth daily. May take with food to minimize GI side effects. Patient taking differently: Take 1,000 mg by mouth daily. May take with food to minimize GI side effects. 08/29/15  Yes Seabron Spates, CNM    naproxen sodium (ANAPROX) 220 MG tablet Take 440 mg by mouth daily as needed (pain).   Yes [provider]    Allergies Ceftin [cefuroxime axetil]   REVIEW OF SYSTEMS  Negative except as noted here or in the History of Present Illness.   PHYSICAL EXAMINATION  Initial Vital Signs Blood pressure 105/75, pulse 79, temperature 98.5 F (36.9 C), temperature source Oral, resp. rate (!) 23, last menstrual period 05/29/2017, SpO2 100 %, unknown if currently breastfeeding.  Examination General: Well-developed, well-nourished female in no acute distress; appearance consistent with age of record HENT: normocephalic; healing hematoma of right forehead Eyes: pupils equal, round and reactive to light; extraocular muscles intact Neck: supple Heart: regular rate and rhythm; no murmur Lungs: clear to auscultation bilaterally Abdomen: soft; nondistended; nontender; no masses or hepatosplenomegaly; bowel sounds present Extremities: No deformity; full range of motion; pulses normal Neurologic: Awake, alert and oriented; motor function intact in all extremities and symmetric; no facial droop Skin: Warm and dry Psychiatric: Angry mood with congruent affect   RESULTS  Summary of this visit's results, reviewed by myself:   EKG Interpretation  Date/Time:  Friday June 24 2017 23:41:38 EST Ventricular Rate:  74 PR Interval:    QRS Duration: 92 QT Interval:  393 QTC Calculation: 436 R Axis:   65 Text Interpretation:  Sinus rhythm Borderline repolarization abnormality No significant change was found Confirmed by Versa Craton, Jenny Reichmann [id number]) on 06/25/2017 12:26:40 AM      Laboratory Studies: Results for orders placed or performed during the hospital encounter of 06/24/17 (from the past 24 hour(s))  Comprehensive metabolic panel     Status: Abnormal   Collection Time: 06/25/17  1:00 AM  Result Value Ref Range   Sodium 139 135 - 145 mmol/L   Potassium 3.6 3.5 - 5.1 mmol/L   Chloride 111  101 - 111 mmol/L   CO2 19 (L) 22 - 32 mmol/L   Glucose, Bld 96 65 - 99 mg/dL   BUN 5 (L) 6 - 20 mg/dL   Creatinine, Ser <0.30 (L) 0.44 - 1.00 mg/dL   Calcium 8.8 (L) 8.9 - 10.3 mg/dL   Total Protein 7.6 6.5 - 8.1 g/dL   Albumin 4.6 3.5 - 5.0 g/dL   AST 38 15 - 41 U/L   ALT 16 14 - 54 U/L   Alkaline Phosphatase 68 38 - 126 U/L   Total Bilirubin 5.5 (H) 0.3 - 1.2 mg/dL   GFR calc non Af Amer NOT CALCULATED >60 mL/min   GFR calc Af Amer NOT CALCULATED >60 mL/min   Anion gap 9 5 - 15  CBC with Differential     Status: Abnormal   Collection Time: 06/25/17  1:00 AM  Result Value Ref Range   WBC 11.4 (H) 4.0 - 10.5 K/uL   RBC 2.85 (L) 3.87 - 5.11 MIL/uL   Hemoglobin 9.8 (L) 12.0 - 15.0 g/dL   HCT 27.5 (L) 36.0 - 46.0 %   MCV 96.5 78.0 - 100.0 fL   MCH 34.4 (H) 26.0 - 34.0 pg   MCHC 35.6 30.0 - 36.0 g/dL   RDW 21.5 (H) 11.5 - 15.5 %   Platelets 447 (H) 150 - 400 K/uL   Neutrophils Relative % 77 %   Lymphocytes Relative 16 %   Monocytes Relative 6 %   Eosinophils Relative 1 %   Basophils Relative 0 %   nRBC 8 (H) 0 /100 WBC   Neutro Abs 8.8 (H) 1.7 - 7.7 K/uL   Lymphs Abs 1.8 0.7 - 4.0 K/uL   Monocytes Absolute 0.7 0.1 - 1.0 K/uL   Eosinophils Absolute 0.1 0.0 - 0.7 K/uL   Basophils Absolute 0.0 0.0 - 0.1 K/uL   RBC Morphology POLYCHROMASIA PRESENT   Reticulocytes     Status: Abnormal   Collection Time: 06/25/17  1:00 AM  Result Value Ref Range   Retic Ct Pct 18.9 (H) 0.4 - 3.1 %   RBC. 2.85 (L) 3.87 - 5.11 MIL/uL   Retic Count, Absolute 538.7 (H) 19.0 - 186.0 K/uL  Troponin I     Status: None   Collection Time: 06/25/17  1:00 AM  Result Value Ref Range   Troponin I <0.03 <0.03 ng/mL  I-Stat Beta hCG blood, ED ([facility], [facility], [facility] only)     Status: None   Collection Time: 06/25/17  1:09 AM  Result Value Ref Range   I-stat hCG, quantitative <5.0 <5 mIU/mL   Comment 3          CBC     Status: Abnormal   Collection Time: 06/25/17  5:12 AM  Result Value Ref Range   WBC 12.5 (H) 4.0  - 10.5 K/uL   RBC 2.17 (L) 3.87 - 5.11 MIL/uL   Hemoglobin 7.5 (L) 12.0 - 15.0 g/dL   HCT 20.9 (L) 36.0 - 46.0 %  MCV 96.3 78.0 - 100.0 fL   MCH 34.6 (H) 26.0 - 34.0 pg   MCHC 35.9 30.0 - 36.0 g/dL   RDW 21.1 (H) 11.5 - 15.5 %   Platelets 435 (H) 150 - 400 K/uL  Creatinine, serum     Status: Abnormal   Collection Time: 06/25/17  5:12 AM  Result Value Ref Range   Creatinine, Ser <0.30 (L) 0.44 - 1.00 mg/dL   GFR calc non Af Amer NOT CALCULATED >60 mL/min   GFR calc Af Amer NOT CALCULATED >60 mL/min  Lactate dehydrogenase     Status: Abnormal   Collection Time: 06/25/17  5:12 AM  Result Value Ref Range   LDH 419 (H) 98 - 192 U/L   Imaging Studies: Dg Chest 2 View  Result Date: 06/24/2017 CLINICAL DATA:  Sickle-cell crisis EXAM: CHEST  2 VIEW COMPARISON:  None. FINDINGS: Stable cardiomegaly. No aortic aneurysm. No pneumonic consolidation, effusion or pneumothorax. No overt pulmonary edema. No acute nor suspicious osseous abnormalities. Cholecystectomy clips are seen in the right upper quadrant. IMPRESSION: No active cardiopulmonary disease.  Stable cardiomegaly. Electronically Signed   By: Ashley Royalty M.D.   On: 06/24/2017 23:37    ED COURSE  Nursing notes and initial vitals signs, including pulse oximetry, reviewed.  Vitals:   06/25/17 0212 06/25/17 0300 06/25/17 0351 06/25/17 0359  BP: 103/64 107/61  (!) 101/52  Pulse: 85 88  89  Resp: 18 18 18 18   Temp:    98 F (36.7 C)  TempSrc:    Oral  SpO2: 100% 99% 100% 100%    PROCEDURES    ED DIAGNOSES     ICD-10-CM   1. Sickle cell pain crisis ([place]) D57.00        Rosebud Koenen, Jenny Reichmann, MD 06/25/17 [phone]

## 2017-06-25 NOTE — Progress Notes (Signed)
  Attempted to call report to Lawanna Kobus, nurse is unavailable   at this time, I will continue to monitor.

## 2017-06-25 NOTE — H&P (Signed)
History and Physical    Gabriella Griffin IOX:[id number] DOB: February 09, 1991 DOA: 06/24/2017  PCP: Patient, No Pcp Per  Patient coming from: Home.  Chief Complaint: Pain.  HPI: Gabriella Griffin is a 26 y.o. female with history of sickle cell anemia presents with complaints of low back pain and chest pain.  Patient has been having these symptoms for last 24 hours.  Denies any fever chills shortness of breath productive cough nausea vomiting focal deficits or any visual symptoms.  Patient states she ran out of her Dilaudid last month.  ED Course: In the ER patient had chest x-ray and EKG which were unremarkable.  Patient was not hypoxic.  Chest pain resolved after patient was given pain relief medication but still continued to have low back pain.  Patient admitted for pain control from sickle cell anemia crisis.  Review of Systems: As per HPI, rest all negative.   Past Medical History:  Diagnosis Date  . Sickle cell anemia (HCC)     Past Surgical History:  Procedure Laterality Date  . CHOLECYSTECTOMY  2007     reports that  has never smoked. she has never used smokeless tobacco. She reports that she does not drink alcohol or use drugs.  Allergies  Allergen Reactions  . Ceftin [Cefuroxime Axetil] Rash    Family History  Problem Relation Age of Onset  . Sickle cell anemia Sister   . Diabetes Paternal Grandmother   . Sickle cell trait Mother   . Sickle cell trait Father     Prior to Admission medications   Medication Sig Start Date End Date Taking? Authorizing Provider  folic acid (FOLVITE) 1 MG tablet Take 2 tablets (2 mg total) by mouth daily. Patient taking differently: Take 1 mg by mouth daily.  06/16/15  Yes [name], MD  HYDROmorphone (DILAUDID) 2 MG tablet Take 1 tablet (2 mg total) by mouth every 4 (four) hours as needed for severe pain. 08/27/15  Yes Woodroe Mode, MD  hydroxyurea (HYDREA) 500 MG capsule Take 1 capsule (500 mg total) by mouth daily. May take  with food to minimize GI side effects. Patient taking differently: Take 1,000 mg by mouth daily. May take with food to minimize GI side effects. 08/29/15  Yes Seabron Spates, CNM  naproxen sodium (ANAPROX) 220 MG tablet Take 440 mg by mouth daily as needed (pain).   Yes [provider]    Physical Exam: Vitals:   06/24/17 2339 06/25/17 0212  BP: 105/75 103/64  Pulse: 79 85  Resp: (!) 23 18  Temp: 98.5 F (36.9 C)   TempSrc: Oral   SpO2: 100% 100%      Constitutional: Moderately built and nourished. Vitals:   06/24/17 2339 06/25/17 0212  BP: 105/75 103/64  Pulse: 79 85  Resp: (!) 23 18  Temp: 98.5 F (36.9 C)   TempSrc: Oral   SpO2: 100% 100%   Eyes: Anicteric no pallor. ENMT: No discharge from the ears eyes nose or mouth. Neck: No neck rigidity no mass felt. Respiratory: No rhonchi or crepitations. Cardiovascular: S1-S2 heard no murmurs appreciated. Abdomen: Soft nontender bowel sounds present. Musculoskeletal: No edema.  No joint effusion. Skin: No rash.  Skin appears warm. Neurologic: Alert awake oriented to time place and person.  Moves all extremities. Psychiatric: Appears normal.  Normal affect.   Labs on Admission: I have personally reviewed following labs and imaging studies  CBC: Recent Labs  Lab 06/25/17 0100  WBC 11.4*  NEUTROABS 8.8*  HGB 9.8*  HCT 27.5*  MCV 96.5  PLT 263*   Basic Metabolic Panel: Recent Labs  Lab 06/25/17 0100  NA 139  K 3.6  CL 111  CO2 19*  GLUCOSE 96  BUN 5*  CREATININE <0.30*  CALCIUM 8.8*   GFR: CrCl cannot be calculated (This lab value cannot be used to calculate CrCl because it is not a number: <0.30). Liver Function Tests: Recent Labs  Lab 06/25/17 0100  AST 38  ALT 16  ALKPHOS 68  BILITOT 5.5*  PROT 7.6  ALBUMIN 4.6   No results for input(s): LIPASE, AMYLASE in the last 168 hours. No results for input(s): AMMONIA in the last 168 hours. Coagulation Profile: No results for input(s):  INR, PROTIME in the last 168 hours. Cardiac Enzymes: Recent Labs  Lab 06/25/17 0100  TROPONINI <0.03   BNP (last 3 results) No results for input(s): PROBNP in the last 8760 hours. HbA1C: No results for input(s): HGBA1C in the last 72 hours. CBG: No results for input(s): GLUCAP in the last 168 hours. Lipid Profile: No results for input(s): CHOL, HDL, LDLCALC, TRIG, CHOLHDL, LDLDIRECT in the last 72 hours. Thyroid Function Tests: No results for input(s): TSH, T4TOTAL, FREET4, T3FREE, THYROIDAB in the last 72 hours. Anemia Panel: Recent Labs    06/25/17 0100  RETICCTPCT 18.9*   Urine analysis:    Component Value Date/Time   COLORURINE YELLOW 05/08/2017 [place] 05/08/2017 1551   LABSPEC 1.015 05/08/2017 1551   PHURINE 6.0 05/08/2017 1551   GLUCOSEU NEGATIVE 05/08/2017 1551   HGBUR TRACE (A) 05/08/2017 1551   BILIRUBINUR NEGATIVE 05/08/2017 1551   KETONESUR NEGATIVE 05/08/2017 1551   PROTEINUR NEGATIVE 05/08/2017 1551   UROBILINOGEN 2.0 (H) 08/06/2015 1051   NITRITE NEGATIVE 05/08/2017 1551   LEUKOCYTESUR TRACE (A) 05/08/2017 1551   Sepsis Labs: @LABRCNTIP (procalcitonin:4,lacticidven:4) )No results found for this or any previous visit (from the past 240 hour(s)).   Radiological Exams on Admission: Dg Chest 2 View  Result Date: 06/24/2017 CLINICAL DATA:  Sickle-cell crisis EXAM: CHEST  2 VIEW COMPARISON:  None. FINDINGS: Stable cardiomegaly. No aortic aneurysm. No pneumonic consolidation, effusion or pneumothorax. No overt pulmonary edema. No acute nor suspicious osseous abnormalities. Cholecystectomy clips are seen in the right upper quadrant. IMPRESSION: No active cardiopulmonary disease.  Stable cardiomegaly. Electronically Signed   By: Ashley Royalty M.D.   On: 06/24/2017 23:37    EKG: Independently reviewed.  Normal sinus rhythm.  Assessment/Plan Principal Problem:   Sickle cell pain crisis ([place])    1. Sickle cell pain crisis -patient has been  placed on Dilaudid full dose PCA.  Continue home dose of hydroxyurea.  Patient is also on scheduled dose of Toradol.  Continue IV hydration and check LDH. 2. Sickle cell anemia -follow CBC.   DVT prophylaxis: Lovenox. Code Status: Full code. Family Communication: Discussed with patient. Disposition Plan: Home. Consults called: None. Admission status: Observation.   [name] MD [facility] Pager [phone].  If 7PM-7AM, please contact night-coverage [email or web address] Password [email or web address]  06/25/2017, 3:08 AM

## 2017-06-26 DIAGNOSIS — D5700 Hb-SS disease with crisis, unspecified: Principal | ICD-10-CM

## 2017-06-26 LAB — CBC WITH DIFFERENTIAL/PLATELET
BASOS PCT: 0 %
Basophils Absolute: 0 10*3/uL (ref 0.0–0.1)
Eosinophils Absolute: 0.2 10*3/uL (ref 0.0–0.7)
Eosinophils Relative: 2 %
HCT: 23.5 % — ABNORMAL LOW (ref 36.0–46.0)
Hemoglobin: 8.3 g/dL — ABNORMAL LOW (ref 12.0–15.0)
LYMPHS ABS: 2.5 10*3/uL (ref 0.7–4.0)
Lymphocytes Relative: 33 %
MCH: 34.6 pg — AB (ref 26.0–34.0)
MCHC: 35.3 g/dL (ref 30.0–36.0)
MCV: 97.9 fL (ref 78.0–100.0)
MONO ABS: 0.8 10*3/uL (ref 0.1–1.0)
Monocytes Relative: 11 %
NEUTROS PCT: 54 %
Neutro Abs: 4.1 10*3/uL (ref 1.7–7.7)
PLATELETS: 464 10*3/uL — AB (ref 150–400)
RBC: 2.4 MIL/uL — ABNORMAL LOW (ref 3.87–5.11)
RDW: 19.9 % — AB (ref 11.5–15.5)
WBC: 7.6 10*3/uL (ref 4.0–10.5)
nRBC: 6 /100 WBC — ABNORMAL HIGH

## 2017-06-26 LAB — COMPREHENSIVE METABOLIC PANEL
ALT: 26 U/L (ref 14–54)
ANION GAP: 8 (ref 5–15)
AST: 45 U/L — AB (ref 15–41)
Albumin: 3.6 g/dL (ref 3.5–5.0)
Alkaline Phosphatase: 64 U/L (ref 38–126)
BUN: <5 mg/dL — ABNORMAL LOW (ref 6–20)
CHLORIDE: 109 mmol/L (ref 101–111)
CO2: 22 mmol/L (ref 22–32)
Calcium: 8.6 mg/dL — ABNORMAL LOW (ref 8.9–10.3)
Creatinine, Ser: 0.37 mg/dL — ABNORMAL LOW (ref 0.44–1.00)
Glucose, Bld: 82 mg/dL (ref 65–99)
POTASSIUM: 3.5 mmol/L (ref 3.5–5.1)
Sodium: 139 mmol/L (ref 135–145)
Total Bilirubin: 4.8 mg/dL — ABNORMAL HIGH (ref 0.3–1.2)
Total Protein: 6.7 g/dL (ref 6.5–8.1)

## 2017-06-26 MED ORDER — ACETAMINOPHEN 325 MG PO TABS
650.0000 mg | ORAL_TABLET | Freq: Four times a day (QID) | ORAL | Status: DC | PRN
  Administered 2017-06-26: 650 mg via ORAL
  Filled 2017-06-26: qty 2

## 2017-06-26 NOTE — Progress Notes (Signed)
Subjective: A 26 year old female admitted with sickle cell painful crisis.  Patient reports 6 out of 10 pain  In the back and legs.  Denied any nausea vomiting or diarrhea.  She has not been able to be functional due to the pain.  She has use 8.4 mg of the Dilaudid PCA with 29 demands and 28 deliveries in the last 24 hours.  Patient is opiate nave.  Objective: Vital signs in last 24 hours: Temp:  [97.9 F (36.6 C)-99.5 F (37.5 C)] 98.3 F (36.8 C) (11/11 1344) Pulse Rate:  [67-83] 68 (11/11 1344) Resp:  [12-21] 12 (11/11 1529) BP: (85-114)/(53-72) 108/68 (11/11 1344) SpO2:  [98 %-100 %] 100 % (11/11 1529) Weight:  [58.1 kg (128 lb 1.4 oz)] 58.1 kg (128 lb 1.4 oz) (11/11 0451) Weight change:  Last BM Date: 06/25/17  Intake/Output from previous day: 11/10 0701 - 11/11 0700 In: 355 [P.O.:355] Out: 400 [Urine:400] Intake/Output this shift: Total I/O In: -  Out: 1550 [Urine:1550]  General appearance: alert, cooperative, appears stated age and no distress Head: Normocephalic, without obvious abnormality, atraumatic Neck: no adenopathy, no carotid bruit, no JVD, supple, symmetrical, trachea midline and thyroid not enlarged, symmetric, no tenderness/mass/nodules Back: symmetric, no curvature. ROM normal. No CVA tenderness. Resp: clear to auscultation bilaterally Chest wall: no tenderness Cardio: regular rate and rhythm, S1, S2 normal, no murmur, click, rub or gallop GI: soft, non-tender; bowel sounds normal; no masses,  no organomegaly Extremities: extremities normal, atraumatic, no cyanosis or edema Pulses: 2+ and symmetric Skin: Skin color, texture, turgor normal. No rashes or lesions Neurologic: Grossly normal  Lab Results: Recent Labs    06/25/17 0512 06/26/17 1023  WBC 12.5* 7.6  HGB 7.5* 8.3*  HCT 20.9* 23.5*  PLT 435* 464*   BMET Recent Labs    06/25/17 0100 06/25/17 0512 06/26/17 1023  NA 139  --  139  K 3.6  --  3.5  CL 111  --  109  CO2 19*  --  22   GLUCOSE 96  --  82  BUN 5*  --  <5*  CREATININE <0.30* <0.30* 0.37*  CALCIUM 8.8*  --  8.6*    Studies/Results: Dg Chest 2 View  Result Date: 06/24/2017 CLINICAL DATA:  Sickle-cell crisis EXAM: CHEST  2 VIEW COMPARISON:  None. FINDINGS: Stable cardiomegaly. No aortic aneurysm. No pneumonic consolidation, effusion or pneumothorax. No overt pulmonary edema. No acute nor suspicious osseous abnormalities. Cholecystectomy clips are seen in the right upper quadrant. IMPRESSION: No active cardiopulmonary disease.  Stable cardiomegaly. Electronically Signed   By: Ashley Royalty M.D.   On: 06/24/2017 23:37    Medications: I have reviewed the patient's current medications.  Assessment/Plan: A 26 year old female admitted with sickle cell painful crisis.  #1 sickle cell painful crisis: Patient is doing better and not using much of her PCA.  I will restart oral medication in anticipation of possible discharge tomorrow. Continue Toradol and IV fluids  #2 sickle cell anemia: Hemoglobin has improved and actually at baseline.  #3 leukocytosis: This has normalized.  Most likely due to sickle cell crisis.  #4 dehydration: Patient aggressively being hydrated.  Continue to monitor.  LOS: 1 day   Hermelinda Diegel,LAWAL 06/26/2017, 5:29 PM

## 2017-06-27 DIAGNOSIS — D638 Anemia in other chronic diseases classified elsewhere: Secondary | ICD-10-CM

## 2017-06-27 DIAGNOSIS — F4321 Adjustment disorder with depressed mood: Secondary | ICD-10-CM

## 2017-06-27 DIAGNOSIS — Z598 Other problems related to housing and economic circumstances: Secondary | ICD-10-CM

## 2017-06-27 LAB — RETICULOCYTES
RBC.: 2.38 MIL/uL — ABNORMAL LOW (ref 3.87–5.11)
RETIC COUNT ABSOLUTE: 411.7 10*3/uL — AB (ref 19.0–186.0)
Retic Ct Pct: 17.3 % — ABNORMAL HIGH (ref 0.4–3.1)

## 2017-06-27 MED ORDER — ONDANSETRON HCL 4 MG/2ML IJ SOLN
4.0000 mg | Freq: Four times a day (QID) | INTRAMUSCULAR | Status: DC | PRN
  Administered 2017-06-27 – 2017-06-29 (×3): 4 mg via INTRAVENOUS
  Filled 2017-06-27 (×3): qty 2

## 2017-06-27 MED ORDER — DIPHENHYDRAMINE HCL 25 MG PO CAPS: 25.0000 mg | ORAL_CAPSULE | Freq: Four times a day (QID) | ORAL | Status: DC | PRN

## 2017-06-27 MED ORDER — KETOROLAC TROMETHAMINE 15 MG/ML IJ SOLN
15.0000 mg | Freq: Four times a day (QID) | INTRAMUSCULAR | Status: DC
  Administered 2017-06-27 – 2017-06-29 (×8): 15 mg via INTRAVENOUS
  Filled 2017-06-27 (×8): qty 1

## 2017-06-27 MED ORDER — HYDROMORPHONE 1 MG/ML IV SOLN
INTRAVENOUS | Status: DC
  Administered 2017-06-27: 4 mg via INTRAVENOUS
  Administered 2017-06-27: 4.2 mg via INTRAVENOUS
  Administered 2017-06-27: 1 mg via INTRAVENOUS
  Administered 2017-06-27: 5.5 mg via INTRAVENOUS
  Administered 2017-06-28: 1 mg via INTRAVENOUS
  Administered 2017-06-28: 3 mg via INTRAVENOUS
  Administered 2017-06-28: 4.5 mg via INTRAVENOUS
  Administered 2017-06-28: 1.5 mg via INTRAVENOUS
  Administered 2017-06-28: 6.5 mg via INTRAVENOUS
  Administered 2017-06-29: 1.5 mg via INTRAVENOUS
  Administered 2017-06-29: 4.5 mg via INTRAVENOUS
  Administered 2017-06-29: 3.5 mg via INTRAVENOUS
  Administered 2017-06-29: 5 mg via INTRAVENOUS
  Administered 2017-06-29: 3 mg via INTRAVENOUS
  Administered 2017-06-29: 0.5 mg via INTRAVENOUS
  Filled 2017-06-27: qty 25

## 2017-06-27 MED ORDER — SODIUM CHLORIDE 0.9% FLUSH: 9.0000 mL | INTRAVENOUS | Status: DC | PRN

## 2017-06-27 MED ORDER — NALOXONE HCL 0.4 MG/ML IJ SOLN: 0.4000 mg | INTRAMUSCULAR | Status: DC | PRN

## 2017-06-27 NOTE — Progress Notes (Signed)
   06/27/17 1554  Clinical Encounter Type  Visited With Patient  Visit Type Follow-up  Referral From Patient  Consult/Referral To Chaplain   Was able to connect with the Chaplains at [facility] about resources for the patient in terms of dealing with the grief of the loss of her daughter a year ago.  Gave the patient the names and phone numbers of two different support groups in [place]. Patient seemed really happy that I followed up and gave her the resources.  Will follow as needed.  Chaplain Katherene Ponto
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
## 2017-06-28 DIAGNOSIS — Z634 Disappearance and death of family member: Secondary | ICD-10-CM

## 2017-06-28 DIAGNOSIS — N920 Excessive and frequent menstruation with regular cycle: Secondary | ICD-10-CM

## 2017-06-28 LAB — CBC WITH DIFFERENTIAL/PLATELET
BASOS ABS: 0 10*3/uL (ref 0.0–0.1)
BASOS PCT: 0 %
EOS ABS: 0.2 10*3/uL (ref 0.0–0.7)
Eosinophils Relative: 3 %
HEMATOCRIT: 24.6 % — AB (ref 36.0–46.0)
HEMOGLOBIN: 8.6 g/dL — AB (ref 12.0–15.0)
Lymphocytes Relative: 28 %
Lymphs Abs: 1.9 10*3/uL (ref 0.7–4.0)
MCH: 34.5 pg — AB (ref 26.0–34.0)
MCHC: 35 g/dL (ref 30.0–36.0)
MCV: 98.8 fL (ref 78.0–100.0)
MONOS PCT: 8 %
Monocytes Absolute: 0.6 10*3/uL (ref 0.1–1.0)
NEUTROS PCT: 61 %
Neutro Abs: 4.1 10*3/uL (ref 1.7–7.7)
Platelets: 418 10*3/uL — ABNORMAL HIGH (ref 150–400)
RBC: 2.49 MIL/uL — ABNORMAL LOW (ref 3.87–5.11)
RDW: 18.7 % — ABNORMAL HIGH (ref 11.5–15.5)
WBC: 6.8 10*3/uL (ref 4.0–10.5)

## 2017-06-28 LAB — RETICULOCYTES
RBC.: 2.49 MIL/uL — ABNORMAL LOW (ref 3.87–5.11)
Retic Count, Absolute: 273.9 10*3/uL — ABNORMAL HIGH (ref 19.0–186.0)
Retic Ct Pct: 11 % — ABNORMAL HIGH (ref 0.4–3.1)

## 2017-06-28 MED ORDER — MEDROXYPROGESTERONE ACETATE 10 MG PO TABS
20.0000 mg | ORAL_TABLET | Freq: Every day | ORAL | Status: DC
  Administered 2017-06-28: 20 mg via ORAL
  Filled 2017-06-28 (×2): qty 2

## 2017-06-28 NOTE — Progress Notes (Signed)
   06/28/17 1339  Clinical Encounter Type  Visited With Patient  Visit Type Follow-up  Spiritual Encounters  Spiritual Needs Emotional;Grief support   Following up from a visit from yesterday.  Patient was in a really good place today able to share some of her journey and wants to get better and is ready to go home, she hope tomorrow.  Her outlook is much more positive today.  Will follow as needed.  Chaplain Katherene Ponto

## 2017-06-28 NOTE — Progress Notes (Signed)
SICKLE CELL SERVICE PROGRESS NOTE  Gabriella Griffin SEG:[id number] DOB: 1991/05/20 DOA: 06/24/2017 PCP: Patient, No Pcp Per  Assessment/Plan: Principal Problem:   Sickle cell pain crisis ([place])  1. Hb SS with vaso-occlusive Crisis: Will continue PCA at current dose and continue Toradol. Decrease IVF to [place].   2. Acute blood loss: Pt reports that she started her menses this week with heavy menses. Discussed options to abort menses until crisis resolved and she is agreeable to oral Provera for 14 days. She understands that with the cessation of Provera, she will have withdrawal bleeding but hopefully crisis will be over by then. 3. Anemia of Chronic Disease: Hb stable. No indication for transfusion. Continue Hydrea and Folic Acid.  4. Psychosocial:  Appreciate notes from [place]. Pt reinforces that she has had suicidal ideations in the past after she lost her baby but not currently. She again speaks about her sister with whom she's very close and states that she would not contemplate taking her life and leaving her sister alone. I will ask Psychiatry to se patient in consultation. I spoke with  Ms. Williams at the [facility] and she informed me that the patient has [place] valid until 07/2017.  I have discussed antidepressants with patient but she does not want to start any more medications as she makes a good point: she is unable to afford the current medications and will not be able to afford any new medications. Nevertheless she is refusing any anti-depressants.   Code Status: Full Code Family Communication: N/A Disposition Plan: Not yet ready for discharge  [place].  Pager [phone]. If 7PM-7AM, please contact night-coverage.  06/28/2017, 12:18 PM  LOS: 3 days   Interim History: Pt states that she feels much better today after speaking with the Chaplin yesterday and receiving resources. Today she reports pain in low back and chest wall at an intensity of 6/10. She has used 20.1mg  of  Dilaudid with 41/41:demands/deliveries on the PCA in the last 24 hours.    Consultants:  Chaplin  Procedures:  None  Antibiotics:  None    Objective: Vitals:   06/28/17 0428 06/28/17 0429 06/28/17 0853 06/28/17 0930  BP: 102/63   98/61  Pulse: 80   71  Resp: 15 13 20 15   Temp: 98.6 F (37 C)   98.6 F (37 C)  TempSrc: Oral   Oral  SpO2: 100% 100% 100% 99%  Weight:       Weight change:   Intake/Output Summary (Last 24 hours) at 06/28/2017 1218 Last data filed at 06/28/2017 0429 Gross per 24 hour  Intake 480 ml  Output 700 ml  Net -220 ml     Physical Exam General: Alert, awake, oriented x3, in no acute distress. HEENT: [place]/AT PEERL, EOMI, anicteric  Heart: Regular rate and rhythm, without murmurs, rubs, gallops, PMI non-displaced, no heaves or thrills on palpation.  Lungs: Clear to auscultation, no wheezing or rhonchi noted. No increased vocal fremitus resonant to percussion  Abdomen: Soft, nontender, nondistended, positive bowel sounds, no masses no hepatosplenomegaly noted.  Neuro: No focal neurological deficits noted cranial nerves II through XII grossly intact. Strength at baseline  in bilateral upper extremities. Lower extremities not tested due to pain.  Musculoskeletal: No warmth swelling or erythema around joints, no spinal tenderness noted. Psychiatric: Patient alert and oriented x3, good insight and cognition, good recent to remote recall.     Data Reviewed: Basic Metabolic Panel: Recent Labs  Lab 06/25/17 0100 06/25/17 0512 06/26/17 1023  NA 139  --  139  K 3.6  --  3.5  CL 111  --  109  CO2 19*  --  22  GLUCOSE 96  --  82  BUN 5*  --  <5*  CREATININE <0.30* <0.30* 0.37*  CALCIUM 8.8*  --  8.6*   Liver Function Tests: Recent Labs  Lab 06/25/17 0100 06/26/17 1023  AST 38 45*  ALT 16 26  ALKPHOS 68 64  BILITOT 5.5* 4.8*  PROT 7.6 6.7  ALBUMIN 4.6 3.6   No results for input(s): LIPASE, AMYLASE in the last 168 hours. No results  for input(s): AMMONIA in the last 168 hours. CBC: Recent Labs  Lab 06/25/17 0100 06/25/17 0512 06/26/17 1023 06/28/17 1146  WBC 11.4* 12.5* 7.6 6.8  NEUTROABS 8.8*  --  4.1 4.1  HGB 9.8* 7.5* 8.3* 8.6*  HCT 27.5* 20.9* 23.5* 24.6*  MCV 96.5 96.3 97.9 98.8  PLT 447* 435* 464* 418*   Cardiac Enzymes: Recent Labs  Lab 06/25/17 0100  TROPONINI <0.03   BNP (last 3 results) No results for input(s): BNP in the last 8760 hours.  ProBNP (last 3 results) No results for input(s): PROBNP in the last 8760 hours.  CBG: No results for input(s): GLUCAP in the last 168 hours.  No results found for this or any previous visit (from the past 240 hour(s)).   Studies: Dg Chest 2 View  Result Date: 06/24/2017 CLINICAL DATA:  Sickle-cell crisis EXAM: CHEST  2 VIEW COMPARISON:  None. FINDINGS: Stable cardiomegaly. No aortic aneurysm. No pneumonic consolidation, effusion or pneumothorax. No overt pulmonary edema. No acute nor suspicious osseous abnormalities. Cholecystectomy clips are seen in the right upper quadrant. IMPRESSION: No active cardiopulmonary disease.  Stable cardiomegaly. Electronically Signed   By: Ashley Royalty M.D.   On: 06/24/2017 23:37    Scheduled Meds: . enoxaparin (LOVENOX) injection  40 mg Subcutaneous Q24H  . folic acid  1 mg Oral Daily  . HYDROmorphone   Intravenous Q4H  . hydroxyurea  1,000 mg Oral Daily  . ketorolac  15 mg Intravenous Q6H  . medroxyPROGESTERone  20 mg Oral Daily  . senna-docusate  1 tablet Oral BID   Continuous Infusions:  Principal Problem:   Sickle cell pain crisis (HCC)    In excess of 35 minutes spent during this visit. Greater than 50% involved face to face contact with the patient for assessment, counseling and coordination of care.

## 2017-06-29 MED ORDER — MEDROXYPROGESTERONE ACETATE 10 MG PO TABS: 20.0000 mg | ORAL_TABLET | Freq: Every day | ORAL | 0 refills | Status: DC

## 2017-06-29 NOTE — Progress Notes (Signed)
Patient was asking when she would be discharged. I had informed her that DR Jonelle Sidle was waiting for psych evaluation. Patient stated she did not want the evaluation at this time and would like to be discharged. I Contacted Dr. Jonelle Sidle and he stated to go ahead and discharge patient. Waiting for discharge orders

## 2017-06-29 NOTE — Progress Notes (Signed)
   06/29/17 1410  Clinical Encounter Type  Visited With Patient  Visit Type Follow-up  Referral From Patient  Consult/Referral To Chaplain  Spiritual Encounters  Spiritual Needs Other (Comment) (Support)   Following up from visits the past two days.  Patient seemed much better today, states she is going home.  She is excited about going home and will follow up on suggestions provided to her for support over the loss of her child over a year ago, I think she is ready to talk about it and begin to heal.  Will follow as needed. Chaplain Katherene Ponto

## 2017-06-29 NOTE — Progress Notes (Signed)
Pt has been educated on discharge paperwork. Patient verbally stated she understood changes in medications as well as any instructions given to her. Pt was wheeled out by wheelchair and picked up by significant other

## 2017-06-29 NOTE — Discharge Summary (Signed)
Physician Discharge Summary  Patient ID: Gabriella Griffin MRN: [id number] DOB/AGE: 1991/08/12 26 y.o.  Admit date: 06/24/2017 Discharge date: 06/29/2017  Admission Diagnoses:  Discharge Diagnoses:  Principal Problem:   Sickle cell pain crisis [facility])   Discharged Condition: good  Hospital Course: patient was admitted with sickle cell painful crisis as well as significant depression and emotional disorders. She was treated with IV Dilaudid PCA and Toradol and IV fluids. Patient was also evaluated for her significant depression which has worsened since she lost her baby in February of this year. Her pain was gradually controlled. She was opiate nave but has recently been admitted several times to the hospital. At time of discharge her pain level was down to 4 out of 10. She was comfortable. Patient was emotionally depressed and psychiatric consultation was obtained but patient says she felt better and did not want to wait for any consultation. She will follow with her PCP to address her depression.  Consults: None  Significant Diagnostic Studies: labs: CBC/CMP is where done serially  Treatments: IV hydration and analgesia: acetaminophen and Dilaudid  Discharge Exam: Blood pressure (!) 103/58, pulse 78, temperature 99.3 F (37.4 C), temperature source Oral, resp. rate 18, height 5\' 2"  (1.575 m), weight 58 kg (127 lb 13.9 oz), last menstrual period 05/29/2017, SpO2 99 %, unknown if currently breastfeeding. General appearance: alert, cooperative, appears stated age and no distress Head: Normocephalic, without obvious abnormality, atraumatic Resp: clear to auscultation bilaterally Cardio: regular rate and rhythm, S1, S2 normal, no murmur, click, rub or gallop GI: soft, non-tender; bowel sounds normal; no masses,  no organomegaly Extremities: extremities normal, atraumatic, no cyanosis or edema Pulses: 2+ and symmetric Skin: Skin color, texture, turgor normal. No rashes or  lesions Neurologic: Grossly normal  Disposition: 01-Home or Self Care   Allergies as of 06/29/2017      Reactions   Ceftin [cefuroxime Axetil] Rash      Medication List    TAKE these medications   folic acid 1 MG tablet Commonly known as:  FOLVITE Take 2 tablets (2 mg total) by mouth daily. What changed:  how much to take   HYDROmorphone 2 MG tablet Commonly known as:  DILAUDID Take 1 tablet (2 mg total) by mouth every 4 (four) hours as needed for severe pain.   hydroxyurea 500 MG capsule Commonly known as:  HYDREA Take 1 capsule (500 mg total) by mouth daily. May take with food to minimize GI side effects. What changed:    how much to take  additional instructions   medroxyPROGESTERone 10 MG tablet Commonly known as:  PROVERA Take 2 tablets (20 mg total) daily by mouth. Start taking on:  06/30/2017   naproxen sodium 220 MG tablet Commonly known as:  ALEVE Take 440 mg by mouth daily as needed (pain).        SignedBarbette Merino 06/29/2017, 12:44 PM   Time spent 31 minutes

## 2017-07-09 ENCOUNTER — Emergency Department (HOSPITAL_COMMUNITY): Payer: Self-pay

## 2017-07-09 ENCOUNTER — Encounter (HOSPITAL_COMMUNITY): Payer: Self-pay | Admitting: Nurse Practitioner

## 2017-07-09 ENCOUNTER — Inpatient Hospital Stay (HOSPITAL_COMMUNITY)
Admission: EM | Admit: 2017-07-09 | Discharge: 2017-07-12 | DRG: 812 | Disposition: A | Discharge status: Home / Self Care | Payer: Self-pay | Attending: Internal Medicine | Admitting: Internal Medicine

## 2017-07-09 ENCOUNTER — Other Ambulatory Visit: Payer: Self-pay

## 2017-07-09 DIAGNOSIS — Z23 Encounter for immunization: Secondary | ICD-10-CM

## 2017-07-09 DIAGNOSIS — F418 Other specified anxiety disorders: Secondary | ICD-10-CM | POA: Diagnosis present

## 2017-07-09 DIAGNOSIS — L0201 Cutaneous abscess of face: Secondary | ICD-10-CM | POA: Diagnosis present

## 2017-07-09 DIAGNOSIS — L089 Local infection of the skin and subcutaneous tissue, unspecified: Secondary | ICD-10-CM

## 2017-07-09 DIAGNOSIS — D5700 Hb-SS disease with crisis, unspecified: Principal | ICD-10-CM | POA: Diagnosis present

## 2017-07-09 DIAGNOSIS — Z833 Family history of diabetes mellitus: Secondary | ICD-10-CM

## 2017-07-09 DIAGNOSIS — Z9049 Acquired absence of other specified parts of digestive tract: Secondary | ICD-10-CM

## 2017-07-09 DIAGNOSIS — Z881 Allergy status to other antibiotic agents status: Secondary | ICD-10-CM

## 2017-07-09 DIAGNOSIS — E860 Dehydration: Secondary | ICD-10-CM | POA: Diagnosis present

## 2017-07-09 DIAGNOSIS — Z832 Family history of diseases of the blood and blood-forming organs and certain disorders involving the immune mechanism: Secondary | ICD-10-CM

## 2017-07-09 DIAGNOSIS — L03211 Cellulitis of face: Secondary | ICD-10-CM | POA: Diagnosis present

## 2017-07-09 LAB — RAPID URINE DRUG SCREEN, HOSP PERFORMED
AMPHETAMINES: NOT DETECTED
BARBITURATES: NOT DETECTED
BENZODIAZEPINES: NOT DETECTED
COCAINE: NOT DETECTED
OPIATES: NOT DETECTED
Tetrahydrocannabinol: POSITIVE — AB

## 2017-07-09 LAB — HEPATIC FUNCTION PANEL
ALBUMIN: 4.2 g/dL (ref 3.5–5.0)
ALT: 14 U/L (ref 14–54)
AST: 33 U/L (ref 15–41)
Alkaline Phosphatase: 72 U/L (ref 38–126)
BILIRUBIN INDIRECT: 2.9 mg/dL — AB (ref 0.3–0.9)
Bilirubin, Direct: 0.3 mg/dL (ref 0.1–0.5)
TOTAL PROTEIN: 7.7 g/dL (ref 6.5–8.1)
Total Bilirubin: 3.2 mg/dL — ABNORMAL HIGH (ref 0.3–1.2)

## 2017-07-09 LAB — BASIC METABOLIC PANEL
ANION GAP: 5 (ref 5–15)
BUN: 6 mg/dL (ref 6–20)
CHLORIDE: 107 mmol/L (ref 101–111)
CO2: 26 mmol/L (ref 22–32)
Calcium: 8.9 mg/dL (ref 8.9–10.3)
Creatinine, Ser: 0.4 mg/dL — ABNORMAL LOW (ref 0.44–1.00)
GFR calc Af Amer: >60 mL/min (ref >60)
Glucose, Bld: 97 mg/dL (ref 65–99)
POTASSIUM: 3.6 mmol/L (ref 3.5–5.1)
SODIUM: 138 mmol/L (ref 135–145)

## 2017-07-09 LAB — URINALYSIS, ROUTINE W REFLEX MICROSCOPIC
Bilirubin Urine: NEGATIVE
GLUCOSE, UA: NEGATIVE mg/dL
Ketones, ur: NEGATIVE mg/dL
Leukocytes, UA: NEGATIVE
Nitrite: NEGATIVE
PROTEIN: NEGATIVE mg/dL
Specific Gravity, Urine: 1.011 (ref 1.005–1.030)
pH: 7 (ref 5.0–8.0)

## 2017-07-09 LAB — CBC WITH DIFFERENTIAL/PLATELET
BASOS PCT: 0 %
Basophils Absolute: 0 10*3/uL (ref 0.0–0.1)
EOS ABS: 0 10*3/uL (ref 0.0–0.7)
EOS PCT: 0 %
HCT: 24.9 % — ABNORMAL LOW (ref 36.0–46.0)
HEMOGLOBIN: 8.8 g/dL — AB (ref 12.0–15.0)
LYMPHS PCT: 21 %
Lymphs Abs: 3.9 10*3/uL (ref 0.7–4.0)
MCH: 36.1 pg — AB (ref 26.0–34.0)
MCHC: 35.3 g/dL (ref 30.0–36.0)
MCV: 102 fL — ABNORMAL HIGH (ref 78.0–100.0)
MONO ABS: 1.7 10*3/uL — AB (ref 0.1–1.0)
Monocytes Relative: 9 %
NEUTROS PCT: 70 %
Neutro Abs: 12.9 10*3/uL — ABNORMAL HIGH (ref 1.7–7.7)
PLATELETS: 488 10*3/uL — AB (ref 150–400)
RBC: 2.44 MIL/uL — ABNORMAL LOW (ref 3.87–5.11)
RDW: 21.7 % — ABNORMAL HIGH (ref 11.5–15.5)
WBC: 18.5 10*3/uL — AB (ref 4.0–10.5)

## 2017-07-09 LAB — RETICULOCYTES
RBC.: 2.44 MIL/uL — AB (ref 3.87–5.11)
RETIC COUNT ABSOLUTE: 458.7 10*3/uL — AB (ref 19.0–186.0)
RETIC CT PCT: 18.8 % — AB (ref 0.4–3.1)

## 2017-07-09 LAB — POC URINE PREG, ED: PREG TEST UR: NEGATIVE

## 2017-07-09 MED ORDER — ACETAMINOPHEN 650 MG RE SUPP: 650.0000 mg | Freq: Four times a day (QID) | RECTAL | Status: DC | PRN

## 2017-07-09 MED ORDER — HYDROMORPHONE HCL 2 MG/ML IJ SOLN
2.0000 mg | Freq: Once | INTRAMUSCULAR | Status: AC
  Administered 2017-07-09: 2 mg via INTRAVENOUS
  Filled 2017-07-09: qty 1

## 2017-07-09 MED ORDER — DIPHENHYDRAMINE HCL 12.5 MG/5ML PO ELIX: 12.5000 mg | ORAL_SOLUTION | Freq: Four times a day (QID) | ORAL | Status: DC | PRN

## 2017-07-09 MED ORDER — ENOXAPARIN SODIUM 40 MG/0.4ML ~~LOC~~ SOLN
40.0000 mg | Freq: Every day | SUBCUTANEOUS | Status: DC
  Administered 2017-07-10 – 2017-07-11 (×3): 40 mg via SUBCUTANEOUS
  Filled 2017-07-09 (×3): qty 0.4

## 2017-07-09 MED ORDER — KETOROLAC TROMETHAMINE 30 MG/ML IJ SOLN
15.0000 mg | Freq: Once | INTRAMUSCULAR | Status: AC
  Administered 2017-07-09: 15 mg via INTRAVENOUS
  Filled 2017-07-09: qty 1

## 2017-07-09 MED ORDER — DIPHENHYDRAMINE HCL 25 MG PO CAPS
50.0000 mg | ORAL_CAPSULE | Freq: Once | ORAL | Status: AC
  Administered 2017-07-09: 50 mg via ORAL
  Filled 2017-07-09: qty 2

## 2017-07-09 MED ORDER — HYDROXYUREA 500 MG PO CAPS
1000.0000 mg | ORAL_CAPSULE | Freq: Every day | ORAL | Status: DC
  Administered 2017-07-10 – 2017-07-12 (×3): 1000 mg via ORAL
  Filled 2017-07-09 (×3): qty 2

## 2017-07-09 MED ORDER — DIPHENHYDRAMINE HCL 50 MG/ML IJ SOLN: 12.5000 mg | Freq: Four times a day (QID) | INTRAMUSCULAR | Status: DC | PRN

## 2017-07-09 MED ORDER — SODIUM CHLORIDE 0.9% FLUSH: 9.0000 mL | INTRAVENOUS | Status: DC | PRN

## 2017-07-09 MED ORDER — HYDRALAZINE HCL 20 MG/ML IJ SOLN: 10.0000 mg | Freq: Three times a day (TID) | INTRAMUSCULAR | Status: DC | PRN

## 2017-07-09 MED ORDER — SODIUM CHLORIDE 0.9 % IV SOLN
INTRAVENOUS | Status: AC
  Administered 2017-07-10: via INTRAVENOUS

## 2017-07-09 MED ORDER — FOLIC ACID 1 MG PO TABS
1.0000 mg | ORAL_TABLET | Freq: Every day | ORAL | Status: DC
  Administered 2017-07-10 – 2017-07-12 (×3): 1 mg via ORAL
  Filled 2017-07-09 (×3): qty 1

## 2017-07-09 MED ORDER — DOXYCYCLINE HYCLATE 100 MG PO TABS
100.0000 mg | ORAL_TABLET | Freq: Two times a day (BID) | ORAL | Status: DC
  Administered 2017-07-09 – 2017-07-12 (×6): 100 mg via ORAL
  Filled 2017-07-09 (×6): qty 1

## 2017-07-09 MED ORDER — DEXTROSE 5 % IV BOLUS
1000.0000 mL | Freq: Once | INTRAVENOUS | Status: AC
  Administered 2017-07-09: 1000 mL via INTRAVENOUS

## 2017-07-09 MED ORDER — ONDANSETRON HCL 4 MG PO TABS: 4.0000 mg | ORAL_TABLET | Freq: Four times a day (QID) | ORAL | Status: DC | PRN

## 2017-07-09 MED ORDER — ACETAMINOPHEN 325 MG PO TABS: 650.0000 mg | ORAL_TABLET | Freq: Four times a day (QID) | ORAL | Status: DC | PRN

## 2017-07-09 MED ORDER — HYDROMORPHONE 1 MG/ML IV SOLN
INTRAVENOUS | Status: DC
  Administered 2017-07-10: via INTRAVENOUS
  Administered 2017-07-10: 3.3 mg via INTRAVENOUS
  Administered 2017-07-10: 2.6 mg via INTRAVENOUS
  Administered 2017-07-10: 1.8 mg via INTRAVENOUS
  Administered 2017-07-10: 1.5 mg via INTRAVENOUS
  Administered 2017-07-10: 1.2 mg via INTRAVENOUS
  Administered 2017-07-11: 25 mg via INTRAVENOUS
  Administered 2017-07-11: 3.6 mg via INTRAVENOUS
  Administered 2017-07-11: 3 mg via INTRAVENOUS
  Administered 2017-07-11: 2.1 mg via INTRAVENOUS
  Administered 2017-07-11: 3 mg via INTRAVENOUS
  Administered 2017-07-12: 0.9 mg via INTRAVENOUS
  Administered 2017-07-12: 1.2 mg via INTRAVENOUS
  Administered 2017-07-12: 0.9 mg via INTRAVENOUS
  Administered 2017-07-12: 0.19 mg via INTRAVENOUS
  Filled 2017-07-09 (×3): qty 25

## 2017-07-09 MED ORDER — ONDANSETRON HCL 4 MG/2ML IJ SOLN
4.0000 mg | Freq: Once | INTRAMUSCULAR | Status: AC
  Administered 2017-07-09: 4 mg via INTRAVENOUS
  Filled 2017-07-09: qty 2

## 2017-07-09 MED ORDER — ONDANSETRON HCL 4 MG/2ML IJ SOLN
4.0000 mg | Freq: Four times a day (QID) | INTRAMUSCULAR | Status: DC | PRN
  Administered 2017-07-10 – 2017-07-12 (×5): 4 mg via INTRAVENOUS
  Filled 2017-07-09 (×4): qty 2

## 2017-07-09 MED ORDER — NALOXONE HCL 0.4 MG/ML IJ SOLN: 0.4000 mg | INTRAMUSCULAR | Status: DC | PRN

## 2017-07-09 NOTE — ED Provider Notes (Signed)
[facility] DEPT Provider Note   CSN: [id number] Arrival date & time: 07/09/17  1659     History   Chief Complaint Chief Complaint  Patient presents with  . Sickle Cell Pain Crisis    HPI Gabriella Griffin is a 26 y.o. female.  26 yo F with a chief complaint of sickle cell pain crisis.  Feels like her prior sickle cell pain.  Denies fevers or chills.  She is unsure why this came on.  Was having abdominal pain which is a bit atypical for her.  She is tried her home medications without relief.  Denies dehydration.  Denies recent illness.  Denies history of acute chest.   The history is provided by the patient.  Sickle Cell Pain Crisis  Location:  Abdomen, chest, lower extremity and upper extremity Severity:  Severe Onset quality:  Gradual Duration:  2 days Similar to previous crisis episodes: yes   Timing:  Constant Progression:  Worsening Chronicity:  Recurrent History of pulmonary emboli: no   Relieved by:  Prescription drugs Worsened by:  Nothing Ineffective treatments:  None tried Associated symptoms: chest pain   Associated symptoms: no congestion, no fever, no headaches, no nausea, no shortness of breath, no vomiting and no wheezing     Past Medical History:  Diagnosis Date  . Sickle cell anemia [facility])     Patient Active Problem List   Diagnosis Date Noted  . Sickle cell pain crisis ([place]) 05/08/2017  . Female pelvic pain   . Fever 03/26/2016  . Abdominal pain 08/27/2015  . Preeclampsia, severe 08/21/2015  . Sickle cell anemia with crisis ([place]) 08/14/2015  . Sickle cell anemia with pain ([place]) 07/13/2015  . Sickle cell crisis ([place]) 06/11/2015  . 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 Leukocytosis 06/11/2015  . Hereditary disease in family possibly affecting fetus, affecting management of mother, antepartum condition or complication 99/37/1696  . Supervision of  high-risk pregnancy 02/04/2015  . Maternal sickle cell anemia complicating pregnancy ([place]) 02/04/2015    Past Surgical History:  Procedure Laterality Date  . CESAREAN SECTION N/A 08/25/2015   Procedure: CESAREAN SECTION;  Surgeon: [name], MD;  Location: [place];  Service: Obstetrics;  Laterality: N/A;  . CHOLECYSTECTOMY  2007    OB History    Gravida Para Term Preterm AB Living   2 1 1     1    SAB TAB Ectopic Multiple Live Births         0 1       Home Medications    Prior to Admission medications   Medication Sig Start Date End Date Taking? Authorizing Provider  folic acid (FOLVITE) 1 MG tablet Take 2 tablets (2 mg total) by mouth daily. Patient taking differently: Take 1 mg by mouth daily.  06/16/15  Yes [name], MD  HYDROmorphone (DILAUDID) 2 MG tablet Take 1 tablet (2 mg total) by mouth every 4 (four) hours as needed for severe pain. 08/27/15  Yes Woodroe Mode, MD  hydroxyurea (HYDREA) 500 MG capsule Take 1 capsule (500 mg total) by mouth daily. May take with food to minimize GI side effects. Patient taking differently: Take 1,000 mg by mouth daily. May take with food to minimize GI side effects. 08/29/15  Yes Seabron Spates, CNM  naproxen sodium (ANAPROX) 220 MG tablet Take 440 mg by mouth daily as needed (pain).   Yes [provider]  medroxyPROGESTERone (PROVERA)  10 MG tablet Take 2 tablets (20 mg total) daily by mouth. Patient not taking: Reported on 07/09/2017 06/30/17   [name], MD    Family History Family History  Problem Relation Age of Onset  . Sickle cell anemia Sister   . Diabetes Paternal Grandmother   . Sickle cell trait Mother   . Sickle cell trait Father     Social History Social History   Tobacco Use  . Smoking status: Never Smoker  . Smokeless tobacco: Never Used  Substance Use Topics  . Alcohol use: No  . Drug use: No     Allergies   Ceftin [cefuroxime axetil]   Review of Systems Review of  Systems  Constitutional: Negative for chills and fever.  HENT: Negative for congestion and rhinorrhea.   Eyes: Negative for redness and visual disturbance.  Respiratory: Negative for shortness of breath and wheezing.   Cardiovascular: Positive for chest pain. Negative for palpitations.  Gastrointestinal: Positive for abdominal pain. Negative for nausea and vomiting.  Genitourinary: Negative for dysuria and urgency.  Musculoskeletal: Positive for arthralgias and myalgias.  Skin: Negative for pallor and wound.  Neurological: Negative for dizziness and headaches.     Physical Exam Updated Vital Signs BP 105/71   Pulse 85   Temp 98.7 F (37.1 C) (Oral)   Resp (!) 21   Ht 5\' 2"  (1.575 m)   Wt 52.2 kg (115 lb)   LMP 06/25/2017   SpO2 96%   BMI 21.03 kg/m   Physical Exam  Constitutional: She is oriented to person, place, and time. She appears well-developed and well-nourished. No distress.  HENT:  Head: Normocephalic and atraumatic.  Eyes: EOM are normal. Pupils are equal, round, and reactive to light.  Neck: Normal range of motion. Neck supple.  Cardiovascular: Normal rate and regular rhythm. Exam reveals no gallop and no friction rub.  No murmur heard. Pulmonary/Chest: Effort normal. She has no wheezes. She has no rales.  Abdominal: Soft. She exhibits no distension. There is no tenderness.  Musculoskeletal: She exhibits no edema or tenderness.  Neurological: She is alert and oriented to person, place, and time.  Skin: Skin is warm and dry. She is not diaphoretic.  Psychiatric: She has a normal mood and affect. Her behavior is normal.  Nursing note and vitals reviewed.    ED Treatments / Results  Labs (all labs ordered are listed, but only abnormal results are displayed) Labs Reviewed  CBC WITH DIFFERENTIAL/PLATELET - Abnormal; Notable for the following components:      Result Value   WBC 18.5 (*)    RBC 2.44 (*)    Hemoglobin 8.8 (*)    HCT 24.9 (*)    MCV 102.0 (*)     MCH 36.1 (*)    RDW 21.7 (*)    Platelets 488 (*)    Neutro Abs 12.9 (*)    Monocytes Absolute 1.7 (*)    All other components within normal limits  BASIC METABOLIC PANEL - Abnormal; Notable for the following components:   Creatinine, Ser 0.40 (*)    All other components within normal limits  RETICULOCYTES - Abnormal; Notable for the following components:   Retic Ct Pct 18.8 (*)    RBC. 2.44 (*)    Retic Count, Absolute 458.7 (*)    All other components within normal limits  HEPATIC FUNCTION PANEL - Abnormal; Notable for the following components:   Total Bilirubin 3.2 (*)    Indirect Bilirubin 2.9 (*)  All other components within normal limits  POC URINE PREG, ED    EKG  EKG Interpretation  Date/Time:  Saturday July 09 2017 18:10:20 EST Ventricular Rate:  99 PR Interval:    QRS Duration: 85 QT Interval:  366 QTC Calculation: 470 R Axis:   73 Text Interpretation:  Sinus rhythm Repol abnrm suggests ischemia, anterolateral No STEMI. Similar to Nov 9th tracing.  Confirmed by Nanda Quinton [phone]) on 07/09/2017 6:14:52 PM       Radiology Dg Chest 2 View  Result Date: 07/09/2017 CLINICAL DATA:  26 year old female with central chest pain and shortness of breath since yesterday. History of sickle cell disease. EXAM: CHEST  2 VIEW COMPARISON:  06/24/2017 FINDINGS: Cardiomediastinal silhouette is borderline enlarged but stable. No focal parenchymal opacity, pleural effusion or pneumothorax. There is again mild retrocardiac atelectasis. No acute osseous abnormalities. IMPRESSION: Mild, stable cardiomegaly without active cardiopulmonary disease. Electronically Signed   By: Kristopher Oppenheim M.D.   On: 07/09/2017 19:34    Procedures Procedures (including critical care time)  Medications Ordered in ED Medications  dextrose 5 % bolus 1,000 mL (0 mLs Intravenous Stopped 07/09/17 2047)  HYDROmorphone (DILAUDID) injection 2 mg (2 mg Intravenous Given 07/09/17 1947)  ondansetron  (ZOFRAN) injection 4 mg (4 mg Intravenous Given 07/09/17 1934)  diphenhydrAMINE (BENADRYL) capsule 50 mg (50 mg Oral Given 07/09/17 1929)  HYDROmorphone (DILAUDID) injection 2 mg (2 mg Intravenous Given 07/09/17 2047)  HYDROmorphone (DILAUDID) injection 2 mg (2 mg Intravenous Given 07/09/17 2154)  ketorolac (TORADOL) 30 MG/ML injection 15 mg (15 mg Intravenous Given 07/09/17 2154)     Initial Impression / Assessment and Plan / ED Course  I have reviewed the triage vital signs and the nursing notes.  Pertinent labs & imaging results that were available during my care of the patient were reviewed by me and considered in my medical decision making (see chart for details).     26 yo F with a chief complaint of sickle cell pain crisis.  She does not normally get in her abdomen which is new.  There is no focal abdominal tenderness.  She is having some chest pain will obtain a chest x-ray.  We will give fluids.  The patient feels better but is still not comfortable with going home.  She is requesting admission at this time.  She was given 6 mg of Dilaudid in total and 15 of Toradol.  Will discuss with hospitalist.  Final Clinical Impressions(s) / ED Diagnoses   Final diagnoses:  Vasoocclusive sickle cell crisis [facility])    ED Discharge Orders    None       Deno Etienne, DO 07/09/17 2222

## 2017-07-09 NOTE — ED Triage Notes (Signed)
Pt is c/o sickle cell crisis pain in her chest, back and stomach. States home medications did not help.

## 2017-07-09 NOTE — Procedures (Signed)
Performing Physician: Benjaman Lobe MD Supervising Physician (if applicable): _ N/A PROCEDURE:  Patient identified using her arm band prior to initiating the procedure. The area was prepared and draped in the usual manner. Cleansed with alcohol. The site was not anesthetized. Patient's piercing was removed and then the purulent material was expressed. Culture sent. Bleeding was minimal.  Packing: _ n/a Followup: The patient tolerated the procedure well without complications. Standard post-procedure care is explained and return precautions are given.  [name] MD

## 2017-07-09 NOTE — H&P (Signed)
[facility] History and Physical  Gabriella Griffin UJW:[id number] DOB: May 14, 1991 DOA: 07/09/2017  Referring physician:  PCP: Patient, No Pcp Per   Chief Complaint: "I just hurt all over."  HPI: Gabriella Griffin is a 26 y.o. female with past medical history significant for sickle cell presents the emergency room with pain.  Patient states she was recently admitted and discharged for same.  States she has not been 100% since then.  Last Thursday patient began to feel her symptoms worsening.  He tried to use her pain medication which was ineffective.  Patient has felt slightly feverish.  Denies chills.  Some nasal congestion.  Note patient is followed at [facility] by hematologist  ED course: Patient was given 6 mg of Dilaudid in the ED and still has 7 out of 10 pain.  Chest x-ray negative.  Hospitalist consulted for admission.   Review of Systems:  As per HPI otherwise 10 point review of systems negative.    Past Medical History:  Diagnosis Date  . Sickle cell anemia (HCC)    Past Surgical History:  Procedure Laterality Date  . CESAREAN SECTION N/A 08/25/2015   Procedure: CESAREAN SECTION;  Surgeon: [name], MD;  Location: [facility];  Service: Obstetrics;  Laterality: N/A;  . CHOLECYSTECTOMY  2007   Social History:  reports that  has never smoked. she has never used smokeless tobacco. She reports that she does not drink alcohol or use drugs.  Allergies  Allergen Reactions  . Ceftin [Cefuroxime Axetil] Rash    Family History  Problem Relation Age of Onset  . Sickle cell anemia Sister   . Diabetes Paternal Grandmother   . Sickle cell trait Mother   . Sickle cell trait Father      Prior to Admission medications   Medication Sig Start Date End Date Taking? Authorizing Provider  folic acid (FOLVITE) 1 MG tablet Take 2 tablets (2 mg total) by mouth daily. Patient taking differently: Take 1 mg by mouth daily.  06/16/15  Yes [name], MD  HYDROmorphone  (DILAUDID) 2 MG tablet Take 1 tablet (2 mg total) by mouth every 4 (four) hours as needed for severe pain. 08/27/15  Yes Woodroe Mode, MD  hydroxyurea (HYDREA) 500 MG capsule Take 1 capsule (500 mg total) by mouth daily. May take with food to minimize GI side effects. Patient taking differently: Take 1,000 mg by mouth daily. May take with food to minimize GI side effects. 08/29/15  Yes Seabron Spates, CNM  naproxen sodium (ANAPROX) 220 MG tablet Take 440 mg by mouth daily as needed (pain).   Yes [provider]  medroxyPROGESTERone (PROVERA) 10 MG tablet Take 2 tablets (20 mg total) daily by mouth. Patient not taking: Reported on 07/09/2017 06/30/17   [name], MD   Physical Exam: Vitals:   07/09/17 2115 07/09/17 2130 07/09/17 2145 07/09/17 2230  BP:  105/71  107/70  Pulse: 80 85 85 84  Resp: 19 20 (!) 21 15  Temp:      TempSrc:      SpO2: 93% 97% 96% 93%  Weight:      Height:        Wt Readings from Last 3 Encounters:  07/09/17 52.2 kg (115 lb)  06/29/17 58 kg (127 lb 13.9 oz)  05/14/17 56.2 kg (123 lb 14.4 oz)    General:  Appears calm and comfortable; A&Ox3 Eyes:  PERRL, EOMI, normal lids, iris ENT:  grossly normal hearing, lips & tongue Neck:  no LAD, masses or thyromegaly Cardiovascular:  RRR, no m/r/g. No LE edema.  Respiratory:  CTA bilaterally, no w/r/r. Normal respiratory effort. Abdomen:  soft, ntnd Skin:  no rash seen on limited exam; area draining pus from piercing of the right lateral eyebrow Musculoskeletal:  grossly normal tone BUE/BLE Psychiatric:  grossly normal mood and affect, speech fluent and appropriate Neurologic:  CN 2-12 grossly intact, moves all extremities in coordinated fashion.          Labs on Admission:  Basic Metabolic Panel: Recent Labs  Lab 07/09/17 1915  NA 138  K 3.6  CL 107  CO2 26  GLUCOSE 97  BUN 6  CREATININE 0.40*  CALCIUM 8.9   Liver Function Tests: Recent Labs  Lab 07/09/17 1915  AST 33  ALT 14   ALKPHOS 72  BILITOT 3.2*  PROT 7.7  ALBUMIN 4.2   No results for input(s): LIPASE, AMYLASE in the last 168 hours. No results for input(s): AMMONIA in the last 168 hours. CBC: Recent Labs  Lab 07/09/17 1915  WBC 18.5*  NEUTROABS 12.9*  HGB 8.8*  HCT 24.9*  MCV 102.0*  PLT 488*   Cardiac Enzymes: No results for input(s): CKTOTAL, CKMB, CKMBINDEX, TROPONINI in the last 168 hours.  BNP (last 3 results) No results for input(s): BNP in the last 8760 hours.  ProBNP (last 3 results) No results for input(s): PROBNP in the last 8760 hours.   Serum creatinine: 0.4 mg/dL (L) 07/09/17 1915 Estimated creatinine clearance: 84.3 mL/min (A)  CBG: No results for input(s): GLUCAP in the last 168 hours.  Radiological Exams on Admission: Dg Chest 2 View  Result Date: 07/09/2017 CLINICAL DATA:  26 year old female with central chest pain and shortness of breath since yesterday. History of sickle cell disease. EXAM: CHEST  2 VIEW COMPARISON:  06/24/2017 FINDINGS: Cardiomediastinal silhouette is borderline enlarged but stable. No focal parenchymal opacity, pleural effusion or pneumothorax. There is again mild retrocardiac atelectasis. No acute osseous abnormalities. IMPRESSION: Mild, stable cardiomegaly without active cardiopulmonary disease. Electronically Signed   By: Kristopher Oppenheim M.D.   On: 07/09/2017 19:34    EKG: no new  Assessment/Plan Active Problems:   Sickle cell anemia with crisis (HCC)   Sickle cell pain crises PCA pump & protocol IV fluid hydration Check D bili due to elevated bilirubin UA UDS  Skin infection Removed eyebrow piercing Drained area Culture sent Started doxycycline  Code Status: Full code DVT Prophylaxis: Lovenox Family Communication: Patient declined me calling Disposition Plan: Pending Improvement  Status: Med surg obs  [name], MD Family Medicine [facility] [email or web address] Password [email or web address]

## 2017-07-09 NOTE — ED Notes (Signed)
Requested Ultrasound IV

## 2017-07-10 DIAGNOSIS — D5700 Hb-SS disease with crisis, unspecified: Principal | ICD-10-CM

## 2017-07-10 DIAGNOSIS — L03211 Cellulitis of face: Secondary | ICD-10-CM | POA: Diagnosis present

## 2017-07-10 LAB — CBC
HCT: 23.6 % — ABNORMAL LOW (ref 36.0–46.0)
HEMOGLOBIN: 8.5 g/dL — AB (ref 12.0–15.0)
MCH: 36.3 pg — AB (ref 26.0–34.0)
MCHC: 36 g/dL (ref 30.0–36.0)
MCV: 100.9 fL — ABNORMAL HIGH (ref 78.0–100.0)
PLATELETS: 408 10*3/uL — AB (ref 150–400)
RBC: 2.34 MIL/uL — AB (ref 3.87–5.11)
RDW: 20.9 % — ABNORMAL HIGH (ref 11.5–15.5)
WBC: 15.1 10*3/uL — AB (ref 4.0–10.5)

## 2017-07-10 LAB — BASIC METABOLIC PANEL
ANION GAP: 6 (ref 5–15)
BUN: 7 mg/dL (ref 6–20)
CO2: 26 mmol/L (ref 22–32)
CREATININE: 0.41 mg/dL — AB (ref 0.44–1.00)
Calcium: 8.5 mg/dL — ABNORMAL LOW (ref 8.9–10.3)
Chloride: 105 mmol/L (ref 101–111)
GFR calc non Af Amer: >60 mL/min (ref >60)
Glucose, Bld: 101 mg/dL — ABNORMAL HIGH (ref 65–99)
Potassium: 3.7 mmol/L (ref 3.5–5.1)
Sodium: 137 mmol/L (ref 135–145)

## 2017-07-10 LAB — INFLUENZA PANEL BY PCR (TYPE A & B)
INFLBPCR: NEGATIVE
Influenza A By PCR: NEGATIVE

## 2017-07-10 LAB — BILIRUBIN, DIRECT: Bilirubin, Direct: 0.3 mg/dL (ref 0.1–0.5)

## 2017-07-10 LAB — CREATININE, SERUM
CREATININE: 0.41 mg/dL — AB (ref 0.44–1.00)
GFR calc Af Amer: >60 mL/min (ref >60)

## 2017-07-10 MED ORDER — HYDROMORPHONE HCL 4 MG PO TABS
2.0000 mg | ORAL_TABLET | ORAL | Status: DC
  Administered 2017-07-10 – 2017-07-12 (×8): 2 mg via ORAL
  Filled 2017-07-10 (×8): qty 1

## 2017-07-10 MED ORDER — PNEUMOCOCCAL VAC POLYVALENT 25 MCG/0.5ML IJ INJ
0.5000 mL | INJECTION | INTRAMUSCULAR | Status: AC
  Administered 2017-07-11: 0.5 mL via INTRAMUSCULAR
  Filled 2017-07-10: qty 0.5

## 2017-07-10 NOTE — Progress Notes (Signed)
Subjective: A 26 year old female with sickle cell disease who was discharged about 10 days ago but is back again with sickle cell painful crisis. Patient is complaining of 6 out of 10 pain at the moment and her back. She was doing fine after she was discharged however due to change in the weather her pain came back. She denied any fever or chills no nausea vomiting or diarrhea.  Objective: Vital signs in last 24 hours: Temp:  [98.1 F (36.7 C)-98.7 F (37.1 C)] 98.1 F (36.7 C) (11/25 0506) Pulse Rate:  [73-100] 75 (11/25 0506) Resp:  [11-26] 22 (11/25 0506) BP: (93-109)/(55-72) 105/59 (11/25 0506) SpO2:  [93 %-100 %] 100 % (11/25 0506) Weight:  [52.2 kg (115 lb)-52.6 kg (116 lb)] 52.6 kg (116 lb) (11/25 0014) Weight change:  Last BM Date: 07/09/17  Intake/Output from previous day: 11/24 0701 - 11/25 0700 In: 2225.8 [I.V.:1225.8; IV Piggyback:1000] Out: -  Intake/Output this shift: Total I/O In: 2225.8 [I.V.:1225.8; IV Piggyback:1000] Out: -   General appearance: alert, cooperative, appears stated age and no distress Head: Normocephalic, without obvious abnormality, atraumatic Resp: clear to auscultation bilaterally Cardio: regular rate and rhythm, S1, S2 normal, no murmur, click, rub or gallop GI: soft, non-tender; bowel sounds normal; no masses,  no organomegaly Extremities: extremities normal, atraumatic, no cyanosis or edema Pulses: 2+ and symmetric Skin: Skin color, texture, turgor normal. No rashes or lesions Neurologic: Grossly normal  Lab Results: Recent Labs    07/09/17 1915 07/10/17 0351  WBC 18.5* 15.1*  HGB 8.8* 8.5*  HCT 24.9* 23.6*  PLT 488* 408*   BMET Recent Labs    07/09/17 1915 07/09/17 2320 07/10/17 0351  NA 138  --  137  K 3.6  --  3.7  CL 107  --  105  CO2 26  --  26  GLUCOSE 97  --  101*  BUN 6  --  7  CREATININE 0.40* 0.41* 0.41*  CALCIUM 8.9  --  8.5*    Studies/Results: Dg Chest 2 View  Result Date: 07/09/2017 CLINICAL DATA:   26 year old female with central chest pain and shortness of breath since yesterday. History of sickle cell disease. EXAM: CHEST  2 VIEW COMPARISON:  06/24/2017 FINDINGS: Cardiomediastinal silhouette is borderline enlarged but stable. No focal parenchymal opacity, pleural effusion or pneumothorax. There is again mild retrocardiac atelectasis. No acute osseous abnormalities. IMPRESSION: Mild, stable cardiomegaly without active cardiopulmonary disease. Electronically Signed   By: Kristopher Oppenheim M.D.   On: 07/09/2017 19:34    Medications: I have reviewed the patient's current medications.  Assessment/Plan: A 26 year old female admitted with sickle cell painful crisis  #1 sickle cell painful crisis: Patient appears to be improved on current regimen of Dilaudid PCA with Toradol and IV fluids. She is already down to 5 out of 10 at the moment. I was increased at her home oxycodone scheduled improve patient for possible discharge in next 2448 hrs.  #2 depression with anxiety: Patient is depressed for the most part. I will continue her home regimen. She is not into his not suicidal and appears to be much more stable with her mood.  #3 skin infection: Patient had eyebrow piercing which appears to have been infected. She has been started on doxycycline while the area has been draining after removal of the piecing. Continue monitoring.  #4 dehydration: Patient has been aggressively hydrated.   LOS: 0 days   Shraga Custard,LAWAL 07/10/2017, 5:41 AM

## 2017-07-11 MED ORDER — PROCHLORPERAZINE EDISYLATE 5 MG/ML IJ SOLN: 10.0000 mg | Freq: Once | INTRAMUSCULAR | Status: DC

## 2017-07-11 MED ORDER — PROCHLORPERAZINE EDISYLATE 5 MG/ML IJ SOLN
5.0000 mg | Freq: Four times a day (QID) | INTRAMUSCULAR | Status: DC | PRN
Start: 2017-07-11 — End: 2017-07-12
  Administered 2017-07-11: 5 mg via INTRAVENOUS
  Filled 2017-07-11: qty 2

## 2017-07-11 MED ORDER — SODIUM CHLORIDE 0.9 % IV BOLUS (SEPSIS)
500.0000 mL | Freq: Once | INTRAVENOUS | Status: AC
  Administered 2017-07-11: 500 mL via INTRAVENOUS

## 2017-07-11 NOTE — Progress Notes (Signed)
Subjective: Patient is still having pain but down to 5 out of 10. She has used 15.9 mg with 58 demands and 53 deliveries in the last 24 hours. She is having significant abdominal pain today and nausea.  Objective: Vital signs in last 24 hours: Temp:  [98.1 F (36.7 C)-98.7 F (37.1 C)] 98.1 F (36.7 C) (11/26 1420) Pulse Rate:  [68-76] 69 (11/26 1420) Resp:  [12-24] 12 (11/26 1701) BP: (92-103)/(49-70) 92/49 (11/26 1420) SpO2:  [98 %-100 %] 100 % (11/26 1701) Weight:  [54.4 kg (120 lb)] 54.4 kg (120 lb) (11/26 0012) Weight change: 2.268 kg (5 lb) Last BM Date: 07/09/17  Intake/Output from previous day: 11/25 0701 - 11/26 0700 In: 1191 [P.O.:1581] Out: -  Intake/Output this shift: No intake/output data recorded.  General appearance: alert, cooperative, appears stated age and no distress Head: Normocephalic, without obvious abnormality, atraumatic Resp: clear to auscultation bilaterally Cardio: regular rate and rhythm, S1, S2 normal, no murmur, click, rub or gallop GI: soft, non-tender; bowel sounds normal; no masses,  no organomegaly Extremities: extremities normal, atraumatic, no cyanosis or edema Pulses: 2+ and symmetric Skin: Skin color, texture, turgor normal. No rashes or lesions Neurologic: Grossly normal  Lab Results: Recent Labs    07/09/17 1915 07/10/17 0351  WBC 18.5* 15.1*  HGB 8.8* 8.5*  HCT 24.9* 23.6*  PLT 488* 408*   BMET Recent Labs    07/09/17 1915 07/09/17 2320 07/10/17 0351  NA 138  --  137  K 3.6  --  3.7  CL 107  --  105  CO2 26  --  26  GLUCOSE 97  --  101*  BUN 6  --  7  CREATININE 0.40* 0.41* 0.41*  CALCIUM 8.9  --  8.5*    Studies/Results: Dg Chest 2 View  Result Date: 07/09/2017 CLINICAL DATA:  26 year old female with central chest pain and shortness of breath since yesterday. History of sickle cell disease. EXAM: CHEST  2 VIEW COMPARISON:  06/24/2017 FINDINGS: Cardiomediastinal silhouette is borderline enlarged but stable. No  focal parenchymal opacity, pleural effusion or pneumothorax. There is again mild retrocardiac atelectasis. No acute osseous abnormalities. IMPRESSION: Mild, stable cardiomegaly without active cardiopulmonary disease. Electronically Signed   By: Kristopher Oppenheim M.D.   On: 07/09/2017 19:34    Medications: I have reviewed the patient's current medications.  Assessment/Plan: A 26 year old female admitted with sickle cell painful crisis  #1 sickle cell painful crisis: We will continue current regimen of Dilaudid PCA with Toradol and IV fluids. She is down to 5 out of 10 at the moment.  #2 depression with anxiety: Patient is depressed for the most part. I will continue her home regimen.   #3 skin infection: Patient had eyebrow piercing which appears to have been infected. She has been started on doxycycline while the area has been draining after removal of the piecing. Continue monitoring.  #4 dehydration: Patient has been aggressively hydrated.   LOS: 1 day   Altonio Schwertner,LAWAL 07/11/2017, 6:00 PM

## 2017-07-12 DIAGNOSIS — L03211 Cellulitis of face: Secondary | ICD-10-CM

## 2017-07-12 LAB — CBC WITH DIFFERENTIAL/PLATELET
BASOS PCT: 0 %
Basophils Absolute: 0 10*3/uL (ref 0.0–0.1)
Eosinophils Absolute: 0.1 10*3/uL (ref 0.0–0.7)
Eosinophils Relative: 2 %
HEMATOCRIT: 23.3 % — AB (ref 36.0–46.0)
HEMOGLOBIN: 8.3 g/dL — AB (ref 12.0–15.0)
LYMPHS ABS: 3.4 10*3/uL (ref 0.7–4.0)
Lymphocytes Relative: 47 %
MCH: 36.1 pg — AB (ref 26.0–34.0)
MCHC: 35.6 g/dL (ref 30.0–36.0)
MCV: 101.3 fL — AB (ref 78.0–100.0)
MONO ABS: 0.6 10*3/uL (ref 0.1–1.0)
MONOS PCT: 9 %
NEUTROS ABS: 3 10*3/uL (ref 1.7–7.7)
Neutrophils Relative %: 42 %
Platelets: 433 10*3/uL — ABNORMAL HIGH (ref 150–400)
RBC: 2.3 MIL/uL — ABNORMAL LOW (ref 3.87–5.11)
RDW: 18.8 % — AB (ref 11.5–15.5)
WBC: 7.2 10*3/uL (ref 4.0–10.5)

## 2017-07-12 LAB — RETICULOCYTES
RBC.: 2.3 MIL/uL — AB (ref 3.87–5.11)
Retic Count, Absolute: 312.8 10*3/uL — ABNORMAL HIGH (ref 19.0–186.0)
Retic Ct Pct: 13.6 % — ABNORMAL HIGH (ref 0.4–3.1)

## 2017-07-12 MED ORDER — DOXYCYCLINE HYCLATE 100 MG PO TABS: 100.0000 mg | ORAL_TABLET | Freq: Two times a day (BID) | ORAL | 0 refills | Status: DC

## 2017-07-12 NOTE — Discharge Summary (Signed)
Gabriella Griffin MRN: [id number] DOB/AGE: [birth year] 27 y.o.  Admit date: 07/09/2017 Discharge date: 07/12/2017  Primary Care Physician:  Patient, No Pcp Per   Discharge Diagnoses:   Patient Active Problem List   Diagnosis Date Noted  . Sickle cell anemia with pain ([place]) 07/13/2015  . 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   folic acid 1 MG tablet Commonly known as:  FOLVITE Take 2 tablets (2 mg total) by mouth daily. What changed:  how much to take   HYDROmorphone 2 MG tablet Commonly known as:  DILAUDID Take 1 tablet (2 mg total) by mouth every 4 (four) hours as needed for severe pain.   hydroxyurea 500 MG capsule Commonly known as:  HYDREA Take 1 capsule (500 mg total) by mouth daily. May take with food to minimize GI side effects. What changed:    how much to take  additional instructions   medroxyPROGESTERone 10 MG tablet Commonly known as:  PROVERA Take 2 tablets (20 mg total) daily by mouth.   naproxen sodium 220 MG tablet Commonly known as:  ALEVE Take 440 mg by mouth daily as needed (pain).         Consults:    SIGNIFICANT DIAGNOSTIC STUDIES:  Dg Chest 2 View  Result Date: 07/09/2017 CLINICAL DATA:  26 year old female with central chest pain and shortness of breath since yesterday. History of sickle cell disease. EXAM: CHEST  2 VIEW COMPARISON:  06/24/2017 FINDINGS: Cardiomediastinal silhouette is borderline enlarged but stable. No focal parenchymal opacity, pleural effusion or pneumothorax. There is again mild retrocardiac atelectasis. No acute osseous abnormalities. IMPRESSION: Mild, stable cardiomegaly without  active cardiopulmonary disease. Electronically Signed   By: Kristopher Oppenheim M.D.   On: 07/09/2017 19:34   Dg Chest 2 View  Result Date: 06/24/2017 CLINICAL DATA:  Sickle-cell crisis EXAM: CHEST  2 VIEW COMPARISON:  None. FINDINGS: Stable cardiomegaly. No aortic aneurysm. No pneumonic consolidation, effusion or pneumothorax. No overt pulmonary edema. No acute nor suspicious osseous abnormalities. Cholecystectomy clips are seen in the right upper quadrant. IMPRESSION: No active cardiopulmonary disease.  Stable cardiomegaly. Electronically Signed   By: Ashley Royalty M.D.   On: 06/24/2017 23:37      Recent Results (from the past 240 hour(s))  Wound or Superficial Culture     Status: None (Preliminary result)   Collection Time: 07/09/17 10:00 PM  Result Value Ref Range Status   Specimen Description ABSCESS  Final   Special Requests Normal  Final   Gram Stain   Final    DEGENERATED CELLULAR MATERIAL PRESENT RARE GRAM POSITIVE COCCI IN CLUSTERS    Culture   Final    NORMAL SKIN FLORA Performed at [facility], [address], [place], [place] [address]    Report Status PENDING  Incomplete    BRIEF ADMITTING H & P: Gabriella Griffin is a 26 y.o. female with past medical history significant for sickle cell presents the emergency room with pain.  Patient states she was recently admitted and discharged for same.  States she has not been 100% since then.  Last Thursday patient began to feel her symptoms  worsening.  He tried to use her pain medication which was ineffective.  Patient has felt slightly feverish.  Denies chills.  Some nasal congestion.  Note patient is followed at [facility] by hematologist  ED course: Patient was given 6 mg of Dilaudid in the ED and still has 7 out of 10 pain.  Chest x-ray negative.  Hospitalist consulted for admission.      Hospital Course:  Present on Admission: . (Resolved) Sickle cell anemia with crisis ([place]) . (Resolved) Cellulitis, face  This is  a patient with hemoglobin SS with HPFH who was admitted with sickle cell crisis as well as cellulitis associated with body piercing.  Her pain was managed with Dilaudid via the PCA, Toradol and IV fluids.  As she had improvement in pain she was transitioned to oral Dilaudid 2 mg every 4 hours.  The pain decreased down to 2-3/10 and was well controlled on the oral Dilaudid. The patient was discharged home in stable condition.   According to the patient Her prescription oral Dilaudid was pending at the pharmacy in [place] and she would pick up the oral Dilaudid after discharge.  Patient also has not had hydroxyurea and prescription was also generated to [place] from Dr. Devoria Albe hematologist in [place].  The patient also had an area of cellulitis over the right eyebrow area where she appears in.  She was started on doxycycline and is discharged on doxycycline to complete an additional 2 days for a total of 5 days.   Disposition and Follow-up:  Pt discharged in good condition and will follow up with her Hematologist at an appointment to be rescheduled.    DISCHARGE EXAM:  General: Alert, awake, oriented x3, in mild distress.  HEENT: [place]/AT PEERL, EOMI, anicteric Neck: Trachea midline, no masses, no thyromegal,y no JVD, no carotid bruit OROPHARYNX: Moist, No exudate/ erythema/lesions.  Heart: Regular rate and rhythm, without murmurs, rubs, gallops or S3. PMI non-displaced. Exam reveals no decreased pulses. Pulmonary/Chest: Normal effort. Breath sounds normal. No. Apnea. Clear to auscultation,no stridor,  no wheezing and no rhonchi noted. No respiratory distress and no tenderness noted. Abdomen: Soft, nontender, nondistended, normal bowel sounds, no masses no hepatosplenomegaly noted. No fluid wave and no ascites. There is no guarding or rebound. Neuro: Alert and oriented to person, place and time. Normal motor skills, Displays no atrophy or tremors and exhibits normal  muscle tone.  No focal neurological deficits noted cranial nerves II through XII grossly intact. No sensory deficit noted. DTRs 2+ bilaterally upper and lower extremities. Strength at baseline in bilateral upper and lower extremities. Gait normal. Musculoskeletal: No warmth swelling or erythema around joints, no spinal tenderness noted. Psychiatric: Patient alert and oriented x3, good insight and cognition, good recent to remote recall. Skin: Skin is warm and dry. No bruising, no ecchymosis and no rash noted. Pt is not diaphoretic. No erythema. No pallor     Blood pressure 93/65, pulse 82, temperature 98.4 F (36.9 C), temperature source Oral, resp. rate 11, height 5\' 2"  (1.575 m), weight 55.3 kg (122 lb), last menstrual period 06/25/2017, SpO2 100 %, unknown if currently breastfeeding.  Recent Labs    07/09/17 1915 07/09/17 2320 07/10/17 0351  NA 138  --  137  K 3.6  --  3.7  CL 107  --  105  CO2 26  --  26  GLUCOSE 97  --  101*  BUN 6  --  7  CREATININE 0.40* 0.41* 0.41*  CALCIUM 8.9  --  8.5*   Recent Labs    07/09/17 1915  AST 33  ALT 14  ALKPHOS 72  BILITOT 3.2*  PROT 7.7  ALBUMIN 4.2   No results for input(s): LIPASE, AMYLASE in the last 72 hours. Recent Labs    07/09/17 1915 07/10/17 0351 07/12/17 0422  WBC 18.5* 15.1* 7.2  NEUTROABS 12.9*  --  3.0  HGB 8.8* 8.5* 8.3*  HCT 24.9* 23.6* 23.3*  MCV 102.0* 100.9* 101.3*  PLT 488* 408* 433*     Total time spent including face to face and decision making was greater than 30 minutes  Signed: Natajah Derderian A. 07/12/2017, 1:50 PM

## 2017-07-12 NOTE — Progress Notes (Signed)
SATURATION QUALIFICATIONS: (This note is used to comply with regulatory documentation for home oxygen)  Patient Saturations on Room Air at Rest = 97% Pulse 103  Patient Saturations on 0 Liters of oxygen while Ambulating    POST ambulation: O2 sat 98%, pulse 108

## 2017-07-13 LAB — AEROBIC CULTURE W GRAM STAIN (SUPERFICIAL SPECIMEN)
Culture: NORMAL
Special Requests: NORMAL

## 2017-07-17 ENCOUNTER — Inpatient Hospital Stay (HOSPITAL_COMMUNITY)
Admission: EM | Admit: 2017-07-17 | Discharge: 2017-07-20 | DRG: 812 | Disposition: A | Discharge status: Home / Self Care | Payer: Self-pay | Attending: Internal Medicine | Admitting: Internal Medicine

## 2017-07-17 ENCOUNTER — Other Ambulatory Visit: Payer: Self-pay

## 2017-07-17 ENCOUNTER — Encounter (HOSPITAL_COMMUNITY): Payer: Self-pay

## 2017-07-17 DIAGNOSIS — Z832 Family history of diseases of the blood and blood-forming organs and certain disorders involving the immune mechanism: Secondary | ICD-10-CM

## 2017-07-17 DIAGNOSIS — K5900 Constipation, unspecified: Secondary | ICD-10-CM | POA: Diagnosis present

## 2017-07-17 DIAGNOSIS — Z79899 Other long term (current) drug therapy: Secondary | ICD-10-CM

## 2017-07-17 DIAGNOSIS — D5700 Hb-SS disease with crisis, unspecified: Principal | ICD-10-CM

## 2017-07-17 DIAGNOSIS — E876 Hypokalemia: Secondary | ICD-10-CM | POA: Diagnosis present

## 2017-07-17 DIAGNOSIS — Z881 Allergy status to other antibiotic agents status: Secondary | ICD-10-CM

## 2017-07-17 DIAGNOSIS — Z598 Other problems related to housing and economic circumstances: Secondary | ICD-10-CM

## 2017-07-17 DIAGNOSIS — D638 Anemia in other chronic diseases classified elsewhere: Secondary | ICD-10-CM | POA: Diagnosis present

## 2017-07-17 DIAGNOSIS — Z9049 Acquired absence of other specified parts of digestive tract: Secondary | ICD-10-CM

## 2017-07-17 DIAGNOSIS — D564 Hereditary persistence of fetal hemoglobin [HPFH]: Secondary | ICD-10-CM | POA: Diagnosis present

## 2017-07-17 HISTORY — DX: Sickle-cell disease without crisis: D57.1

## 2017-07-17 LAB — URINALYSIS, COMPLETE (UACMP) WITH MICROSCOPIC
BILIRUBIN URINE: NEGATIVE
Glucose, UA: NEGATIVE mg/dL
KETONES UR: NEGATIVE mg/dL
LEUKOCYTES UA: NEGATIVE
Nitrite: NEGATIVE
Protein, ur: NEGATIVE mg/dL
Specific Gravity, Urine: 1.008 (ref 1.005–1.030)
pH: 5 (ref 5.0–8.0)

## 2017-07-17 LAB — COMPREHENSIVE METABOLIC PANEL
ALBUMIN: 4.8 g/dL (ref 3.5–5.0)
ALT: 30 U/L (ref 14–54)
AST: 42 U/L — AB (ref 15–41)
Alkaline Phosphatase: 90 U/L (ref 38–126)
Anion gap: 6 (ref 5–15)
BILIRUBIN TOTAL: 5 mg/dL — AB (ref 0.3–1.2)
BUN: 10 mg/dL (ref 6–20)
CALCIUM: 9.3 mg/dL (ref 8.9–10.3)
CO2: 25 mmol/L (ref 22–32)
CREATININE: 0.36 mg/dL — AB (ref 0.44–1.00)
Chloride: 109 mmol/L (ref 101–111)
GFR calc Af Amer: >60 mL/min (ref >60)
Glucose, Bld: 86 mg/dL (ref 65–99)
Potassium: 3.2 mmol/L — ABNORMAL LOW (ref 3.5–5.1)
Sodium: 140 mmol/L (ref 135–145)
TOTAL PROTEIN: 8.3 g/dL — AB (ref 6.5–8.1)

## 2017-07-17 LAB — CBC WITH DIFFERENTIAL/PLATELET
BASOS ABS: 0.1 10*3/uL (ref 0.0–0.1)
Basophils Relative: 1 %
EOS ABS: 0 10*3/uL (ref 0.0–0.7)
Eosinophils Relative: 0 %
HCT: 23.4 % — ABNORMAL LOW (ref 36.0–46.0)
Hemoglobin: 8.4 g/dL — ABNORMAL LOW (ref 12.0–15.0)
LYMPHS ABS: 4.6 10*3/uL — AB (ref 0.7–4.0)
Lymphocytes Relative: 37 %
MCH: 36.1 pg — ABNORMAL HIGH (ref 26.0–34.0)
MCHC: 35.9 g/dL (ref 30.0–36.0)
MCV: 100.4 fL — AB (ref 78.0–100.0)
MONO ABS: 1.1 10*3/uL — AB (ref 0.1–1.0)
Monocytes Relative: 9 %
Neutro Abs: 6.5 10*3/uL (ref 1.7–7.7)
Neutrophils Relative %: 53 %
PLATELETS: 451 10*3/uL — AB (ref 150–400)
RBC: 2.33 MIL/uL — AB (ref 3.87–5.11)
RDW: 20.8 % — AB (ref 11.5–15.5)
WBC: 12.3 10*3/uL — AB (ref 4.0–10.5)

## 2017-07-17 LAB — RETICULOCYTES
RBC.: 2.33 MIL/uL — AB (ref 3.87–5.11)
RETIC COUNT ABSOLUTE: 354.2 10*3/uL — AB (ref 19.0–186.0)
RETIC CT PCT: 15.2 % — AB (ref 0.4–3.1)

## 2017-07-17 LAB — I-STAT BETA HCG BLOOD, ED (MC, WL, AP ONLY): I-stat hCG, quantitative: <5 m[IU]/mL (ref <5)

## 2017-07-17 LAB — LACTATE DEHYDROGENASE: LDH: 455 U/L — AB (ref 98–192)

## 2017-07-17 MED ORDER — HYDROMORPHONE HCL 2 MG/ML IJ SOLN: 2.0000 mg | INTRAMUSCULAR | Status: AC

## 2017-07-17 MED ORDER — ACETAMINOPHEN 325 MG PO TABS: 650.0000 mg | ORAL_TABLET | Freq: Four times a day (QID) | ORAL | Status: DC | PRN

## 2017-07-17 MED ORDER — SENNOSIDES-DOCUSATE SODIUM 8.6-50 MG PO TABS: 1.0000 | ORAL_TABLET | Freq: Every evening | ORAL | Status: DC | PRN

## 2017-07-17 MED ORDER — DIPHENHYDRAMINE HCL 25 MG PO CAPS
25.0000 mg | ORAL_CAPSULE | ORAL | Status: DC | PRN
  Administered 2017-07-17: 25 mg via ORAL
  Filled 2017-07-17: qty 1

## 2017-07-17 MED ORDER — ONDANSETRON HCL 4 MG/2ML IJ SOLN
4.0000 mg | Freq: Four times a day (QID) | INTRAMUSCULAR | Status: DC | PRN
  Administered 2017-07-18 – 2017-07-19 (×2): 4 mg via INTRAVENOUS
  Filled 2017-07-17 (×2): qty 2

## 2017-07-17 MED ORDER — SODIUM CHLORIDE 0.9% FLUSH: 9.0000 mL | INTRAVENOUS | Status: DC | PRN

## 2017-07-17 MED ORDER — HYDROMORPHONE HCL 2 MG PO TABS: 2.0000 mg | ORAL_TABLET | ORAL | Status: DC | PRN

## 2017-07-17 MED ORDER — HYDROMORPHONE HCL 2 MG/ML IJ SOLN
2.0000 mg | INTRAMUSCULAR | Status: AC
  Administered 2017-07-17: 2 mg via INTRAVENOUS
  Filled 2017-07-17: qty 1

## 2017-07-17 MED ORDER — ENOXAPARIN SODIUM 40 MG/0.4ML ~~LOC~~ SOLN
40.0000 mg | SUBCUTANEOUS | Status: DC
  Administered 2017-07-17 – 2017-07-18 (×2): 40 mg via SUBCUTANEOUS
  Filled 2017-07-17 (×3): qty 0.4

## 2017-07-17 MED ORDER — ONDANSETRON HCL 4 MG/2ML IJ SOLN
4.0000 mg | Freq: Once | INTRAMUSCULAR | Status: AC
  Administered 2017-07-17: 4 mg via INTRAVENOUS
  Filled 2017-07-17: qty 2

## 2017-07-17 MED ORDER — HYDROMORPHONE 1 MG/ML IV SOLN
INTRAVENOUS | Status: DC
  Administered 2017-07-17: 3.5 mg via INTRAVENOUS
  Administered 2017-07-17: 2 mg via INTRAVENOUS
  Administered 2017-07-17: 08:00:00 via INTRAVENOUS
  Administered 2017-07-17 – 2017-07-18 (×2): 3 mg via INTRAVENOUS
  Filled 2017-07-17: qty 25

## 2017-07-17 MED ORDER — HYDROXYUREA 500 MG PO CAPS
1000.0000 mg | ORAL_CAPSULE | Freq: Every day | ORAL | Status: DC
  Administered 2017-07-17 – 2017-07-20 (×4): 1000 mg via ORAL
  Filled 2017-07-17 (×4): qty 2

## 2017-07-17 MED ORDER — FOLIC ACID 1 MG PO TABS
2.0000 mg | ORAL_TABLET | Freq: Every day | ORAL | Status: DC
  Administered 2017-07-17 – 2017-07-20 (×4): 2 mg via ORAL
  Filled 2017-07-17 (×4): qty 2

## 2017-07-17 MED ORDER — DEXTROSE-NACL 5-0.45 % IV SOLN
INTRAVENOUS | Status: DC
  Administered 2017-07-17 (×2): via INTRAVENOUS

## 2017-07-17 MED ORDER — SODIUM CHLORIDE 0.45 % IV SOLN
INTRAVENOUS | Status: DC
  Administered 2017-07-17: 03:00:00 via INTRAVENOUS

## 2017-07-17 MED ORDER — ACETAMINOPHEN 650 MG RE SUPP: 650.0000 mg | Freq: Four times a day (QID) | RECTAL | Status: DC | PRN

## 2017-07-17 MED ORDER — NALOXONE HCL 0.4 MG/ML IJ SOLN: 0.4000 mg | INTRAMUSCULAR | Status: DC | PRN

## 2017-07-17 MED ORDER — POTASSIUM CHLORIDE 20 MEQ/15ML (10%) PO SOLN
40.0000 meq | Freq: Once | ORAL | Status: AC
  Administered 2017-07-17: 40 meq via ORAL
  Filled 2017-07-17: qty 30

## 2017-07-17 MED ORDER — BISACODYL 10 MG RE SUPP: 10.0000 mg | Freq: Every day | RECTAL | Status: DC | PRN

## 2017-07-17 MED ORDER — NAPROXEN SODIUM 275 MG PO TABS
440.0000 mg | ORAL_TABLET | Freq: Every day | ORAL | Status: DC | PRN
  Filled 2017-07-17: qty 2

## 2017-07-17 MED ORDER — KETOROLAC TROMETHAMINE 30 MG/ML IJ SOLN
30.0000 mg | Freq: Once | INTRAMUSCULAR | Status: AC
  Administered 2017-07-17: 30 mg via INTRAVENOUS
  Filled 2017-07-17: qty 1

## 2017-07-17 NOTE — ED Triage Notes (Signed)
States sickle cell pain for one day and getting worse states last flare up one week ago.

## 2017-07-17 NOTE — Progress Notes (Signed)
This is a no charge note  Pending admission per PA, Gabriella Griffin  26 year old lady with past medical history of sickle cell anemia, who presents with severe back pain, no chest pain. Hgb is stable. Patient is admitted to [place] bed due to sickle cell pain crisis. PCA protocol was started.   [name], MD  [facility] Pager [phone]  If 7PM-7AM, please contact night-coverage [email or web address] Password [facility] 07/17/2017, 5:16 AM

## 2017-07-17 NOTE — H&P (Signed)
History and Physical    Gabriella Griffin ESP:[id number] DOB: 03/14/1991 DOA: 07/17/2017   PCP: Patient, No Pcp Per   Patient coming from:  Home    Chief Complaint:'Pain all over "  HPI: Gabriella Griffin is a 26 y.o. female with medical history significant sickle cell anemia followed at [facility] by hematology, presenting today with severe back pain, and shoulder pain, similar to the symptoms during crisis.  She reports that due to the weather, her symptoms have become more frequent.  This year she had 6 crisis, last event on 07/12/2017 requiring hospitalization.  She denies any chest pain or palpitations.  She denies any shortness of breath or cough.  She reports subjective fevers, but no chills.  She denies any abdominal pain nausea or vomiting.  She denies any lower extremity swelling or calf pain.  Denies any headaches or vision changes. she reports being compliant with Hydrea.  Denies tobacco, alcohol or recreational drug use.   ED Course:  BP 111/70   Pulse 99   Temp 98.6 F (37 C) (Oral)   Resp 16   Ht 5\' 5"  (1.651 m)   Wt 55.3 kg (122 lb)   LMP 06/25/2017   SpO2 97%   BMI 20.30 kg/m   She received 6 mg of Dilaudid in the ER, without significant relief, reporting the pain being 7 out of 10 at the time. She also is receiving generous IV hydration Chest x-ray is pending Sickle cell crisis order set was initiated at the ED White count 12.3, likely reactive. Globin is 8.4, has been stable for on recent labs Platelets 451 Pregnancy test negative  Review of Systems:  As per HPI otherwise all other systems reviewed and are negative  Past Medical History:  Diagnosis Date  . Sickle cell anemia (HCC)     Past Surgical History:  Procedure Laterality Date  . CESAREAN SECTION N/A 08/25/2015   Procedure: CESAREAN SECTION;  Surgeon: [name], MD;  Location: [facility];  Service: Obstetrics;  Laterality: N/A;  . CHOLECYSTECTOMY  2007    Social History Social  History   Socioeconomic History  . Marital status: Single    Spouse name: Not on file  . Number of children: Not on file  . Years of education: Not on file  . Highest education level: Not on file  Social Needs  . Financial resource strain: Not on file  . Food insecurity - worry: Not on file  . Food insecurity - inability: Not on file  . Transportation needs - medical: Not on file  . Transportation needs - non-medical: Not on file  Occupational History  . Not on file  Tobacco Use  . Smoking status: Never Smoker  . Smokeless tobacco: Never Used  Substance and Sexual Activity  . Alcohol use: No  . Drug use: No  . Sexual activity: Yes    Birth control/protection: None  Other Topics Concern  . Not on file  Social History Narrative  . Not on file     Allergies  Allergen Reactions  . Ceftin [Cefuroxime Axetil] Rash    Family History  Problem Relation Age of Onset  . Sickle cell anemia Sister   . Diabetes Paternal Grandmother   . Sickle cell trait Mother   . Sickle cell trait Father       Prior to Admission medications   Medication Sig Start Date End Date Taking? Authorizing Provider  folic acid (FOLVITE) 1 MG tablet Take 2 tablets (2 mg total)  by mouth daily. Patient taking differently: Take 1 mg by mouth daily.  06/16/15  Yes [name], MD  HYDROmorphone (DILAUDID) 2 MG tablet Take 1 tablet (2 mg total) by mouth every 4 (four) hours as needed for severe pain. 08/27/15  Yes Woodroe Mode, MD  hydroxyurea (HYDREA) 500 MG capsule Take 1 capsule (500 mg total) by mouth daily. May take with food to minimize GI side effects. Patient taking differently: Take 1,000 mg by mouth daily. May take with food to minimize GI side effects. 08/29/15  Yes Seabron Spates, CNM  naproxen sodium (ANAPROX) 220 MG tablet Take 440 mg by mouth daily as needed (pain).   Yes [provider]  doxycycline (VIBRA-TABS) 100 MG tablet Take 1 tablet (100 mg total) by mouth every 12  (twelve) hours. Patient not taking: Reported on 07/17/2017 07/12/17   [name], MD  medroxyPROGESTERone (PROVERA) 10 MG tablet Take 2 tablets (20 mg total) daily by mouth. Patient not taking: Reported on 07/09/2017 06/30/17   [name], MD    Physical Exam:  Vitals:   07/17/17 0416 07/17/17 0430 07/17/17 0500 07/17/17 0600  BP: (!) 102/56 111/64 107/66 111/70  Pulse: 93 95 91 99  Resp: 14 13 16 16   Temp:      TempSrc:      SpO2: 94% 90% 94% 97%  Weight:      Height:       Constitutional: NAD, calm, uncomfortable due to pain  eyes: PERRL, lids and conjunctivae normal ENMT: Mucous membranes are moist, without exudate or lesions  Neck: normal, supple, no masses, no thyromegaly Respiratory: clear to auscultation bilaterally, no wheezing, no crackles. Normal respiratory effort  Cardiovascular: Regular rate and rhythm, very soft 1 out of 6 systolic murmur, rubs or gallops. No extremity edema. 2+ pedal pulses. No carotid bruits.  Abdomen: Soft, non tender, No hepatosplenomegaly. Bowel sounds positive.  Musculoskeletal: no clubbing / cyanosis. Moves all extremities.  Cannot reproduce pain. Skin: no jaundice, No lesions.  Neurologic: Sensation intact  Strength equal in all extremities Psychiatric:   Alert and oriented x 3. Normal mood.     Labs on Admission: I have personally reviewed following labs and imaging studies  CBC: Recent Labs  Lab 07/12/17 0422 07/17/17 0224  WBC 7.2 12.3*  NEUTROABS 3.0 6.5  HGB 8.3* 8.4*  HCT 23.3* 23.4*  MCV 101.3* 100.4*  PLT 433* 451*    Basic Metabolic Panel: Recent Labs  Lab 07/17/17 0224  NA 140  K 3.2*  CL 109  CO2 25  GLUCOSE 86  BUN 10  CREATININE 0.36*  CALCIUM 9.3    GFR: Estimated Creatinine Clearance: 93 mL/min (A) (by C-G formula based on SCr of 0.36 mg/dL (L)).  Liver Function Tests: Recent Labs  Lab 07/17/17 0224  AST 42*  ALT 30  ALKPHOS 90  BILITOT 5.0*  PROT 8.3*  ALBUMIN 4.8   No  results for input(s): LIPASE, AMYLASE in the last 168 hours. No results for input(s): AMMONIA in the last 168 hours.  Coagulation Profile: No results for input(s): INR, PROTIME in the last 168 hours.  Cardiac Enzymes: No results for input(s): CKTOTAL, CKMB, CKMBINDEX, TROPONINI in the last 168 hours.  BNP (last 3 results) No results for input(s): PROBNP in the last 8760 hours.  HbA1C: No results for input(s): HGBA1C in the last 72 hours.  CBG: No results for input(s): GLUCAP in the last 168 hours.  Lipid Profile: No results for input(s):  CHOL, HDL, LDLCALC, TRIG, CHOLHDL, LDLDIRECT in the last 72 hours.  Thyroid Function Tests: No results for input(s): TSH, T4TOTAL, FREET4, T3FREE, THYROIDAB in the last 72 hours.  Anemia Panel: Recent Labs    07/17/17 0224  RETICCTPCT 15.2*    Urine analysis:    Component Value Date/Time   COLORURINE YELLOW 07/09/2017 2241   APPEARANCEUR CLEAR 07/09/2017 2241   LABSPEC 1.011 07/09/2017 2241   PHURINE 7.0 07/09/2017 2241   GLUCOSEU NEGATIVE 07/09/2017 2241   HGBUR SMALL (A) 07/09/2017 2241   BILIRUBINUR NEGATIVE 07/09/2017 2241   KETONESUR NEGATIVE 07/09/2017 2241   PROTEINUR NEGATIVE 07/09/2017 2241   UROBILINOGEN 2.0 (H) 08/06/2015 1051   NITRITE NEGATIVE 07/09/2017 2241   LEUKOCYTESUR NEGATIVE 07/09/2017 2241    Sepsis Labs: @LABRCNTIP (procalcitonin:4,lacticidven:4) ) Recent Results (from the past 240 hour(s))  Wound or Superficial Culture     Status: None   Collection Time: 07/09/17 10:00 PM  Result Value Ref Range Status   Specimen Description ABSCESS  Final   Special Requests Normal  Final   Gram Stain   Final    DEGENERATED CELLULAR MATERIAL PRESENT RARE GRAM POSITIVE COCCI IN CLUSTERS    Culture   Final    NORMAL SKIN FLORA Performed at [facility], [place] [address], [place], [place] [address]    Report Status 07/13/2017 FINAL  Final     Radiological Exams on Admission: No results found.  EKG:  Independently reviewed.  Assessment/Plan Active Problems:   Sickle cell pain crisis (HCC)   Sickle Cell Crisis, recurrent despite being compliant with Hydrea. She received 6 mg of Dilaudid in the ER, without significant relief She also is receiving generous IV hydration Chest x-ray is pending. Sickle cell crisis order set was initiated at the ED White count 12.3, likely reactive. Hemoglobin 8.4, has been stable for on recent labs. Platelets 451.Pregnancy test negative The patient has been placed on Dilaudid for dose PCA per night MD Continue home dose of Hydrea Continue NSAIDs Continue IV hydration Check LDH Follow CBC   DVT prophylaxis: Lovenox Code Status:    Full code Family Communication:  Discussed with patient Disposition Plan: Expect patient to be discharged to home after condition improves Consults called:    None, but will notify Dr. Jonelle Sidle for follow-up Admission status: Inpatient MedSurg   Sharene Butters, PA-C [facility]   07/17/2017, 6:36 AM

## 2017-07-17 NOTE — ED Notes (Signed)
Bed: WA18 Expected date:  Expected time:  Means of arrival:  Comments: Room 5 

## 2017-07-17 NOTE — ED Provider Notes (Signed)
[facility] DEPT Provider Note   CSN: [id number] Arrival date & time: 07/17/17  0115     History   Chief Complaint Chief Complaint  Patient presents with  . Sickle Cell Pain Crisis    HPI Gabriella Griffin is a 26 y.o. female.  Patient with history of sickle cell anemia presents with back pain in upper and lower back, c/w previous distribution of sickle cell pain. No fever, cough, SOB or vomiting. She has been compliant with home medications without relief of her pain.    The history is provided by the patient. No language interpreter was used.    Past Medical History:  Diagnosis Date  . Sickle cell anemia [facility])     Patient Active Problem List   Diagnosis Date Noted  . Sickle cell anemia with pain ([place]) 07/13/2015  . 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 CESAREAN SECTION N/A 08/25/2015   Procedure: CESAREAN SECTION;  Surgeon: [name], MD;  Location: [facility];  Service: Obstetrics;  Laterality: N/A;  . CHOLECYSTECTOMY  2007    OB History    Gravida Para Term Preterm AB Living   2 1 1     1    SAB TAB Ectopic Multiple Live Births         0 1       Home Medications    Prior to Admission medications   Medication Sig Start Date End Date Taking? Authorizing Provider  folic acid (FOLVITE) 1 MG tablet Take 2 tablets (2 mg total) by mouth daily. Patient taking differently: Take 1 mg by mouth daily.  06/16/15  Yes [name], MD  HYDROmorphone (DILAUDID) 2 MG tablet Take 1 tablet (2 mg total) by mouth every 4 (four) hours as needed for severe pain. 08/27/15  Yes Woodroe Mode, MD  hydroxyurea (HYDREA) 500 MG capsule Take 1 capsule (500 mg total) by mouth daily. May take with food to minimize GI side effects. Patient taking differently: Take 1,000 mg by mouth daily. May take with food to minimize GI side  effects. 08/29/15  Yes Seabron Spates, CNM  naproxen sodium (ANAPROX) 220 MG tablet Take 440 mg by mouth daily as needed (pain).   Yes [provider]  doxycycline (VIBRA-TABS) 100 MG tablet Take 1 tablet (100 mg total) by mouth every 12 (twelve) hours. Patient not taking: Reported on 07/17/2017 07/12/17   [name], MD  medroxyPROGESTERone (PROVERA) 10 MG tablet Take 2 tablets (20 mg total) daily by mouth. Patient not taking: Reported on 07/09/2017 06/30/17   [name], MD    Family History Family History  Problem Relation Age of Onset  . Sickle cell anemia Sister   . Diabetes Paternal Grandmother   . Sickle cell trait Mother   . Sickle cell trait Father     Social History Social History   Tobacco Use  . Smoking status: Never Smoker  . Smokeless tobacco: Never Used  Substance Use Topics  . Alcohol use: No  . Drug use: No     Allergies   Ceftin [cefuroxime axetil]   Review of Systems Review of Systems  Constitutional: Negative for chills and fever.  HENT: Negative.   Respiratory: Negative.  Negative for shortness of breath.   Cardiovascular: Negative.  Negative for chest pain.  Gastrointestinal: Negative.  Negative for abdominal pain and vomiting.  Musculoskeletal:  See HPI.  Skin: Negative.   Neurological: Negative.      Physical Exam Updated Vital Signs BP 111/64   Pulse 95   Temp 98.6 F (37 C) (Oral)   Resp 13   Ht 5\' 5"  (1.651 m)   Wt 55.3 kg (122 lb)   LMP 06/25/2017   SpO2 90%   BMI 20.30 kg/m   Physical Exam  Constitutional: She is oriented to person, place, and time. She appears well-developed and well-nourished.  HENT:  Head: Normocephalic.  Eyes: Scleral icterus is present.  Neck: Normal range of motion. Neck supple.  Cardiovascular: Normal rate and regular rhythm.  Pulmonary/Chest: Effort normal and breath sounds normal.  Abdominal: Soft. Bowel sounds are normal. There is no tenderness. There is no  rebound and no guarding.  Musculoskeletal: Normal range of motion.  No swelling or discoloration of extremities, including joints.   Neurological: She is alert and oriented to person, place, and time.  Skin: Skin is warm and dry.  Psychiatric: She has a normal mood and affect.     ED Treatments / Results  Labs (all labs ordered are listed, but only abnormal results are displayed) Labs Reviewed  COMPREHENSIVE METABOLIC PANEL - Abnormal; Notable for the following components:      Result Value   Potassium 3.2 (*)    Creatinine, Ser 0.36 (*)    Total Protein 8.3 (*)    AST 42 (*)    Total Bilirubin 5.0 (*)    All other components within normal limits  CBC WITH DIFFERENTIAL/PLATELET - Abnormal; Notable for the following components:   WBC 12.3 (*)    RBC 2.33 (*)    Hemoglobin 8.4 (*)    HCT 23.4 (*)    MCV 100.4 (*)    MCH 36.1 (*)    RDW 20.8 (*)    Platelets 451 (*)    Lymphs Abs 4.6 (*)    Monocytes Absolute 1.1 (*)    All other components within normal limits  RETICULOCYTES - Abnormal; Notable for the following components:   Retic Ct Pct 15.2 (*)    RBC. 2.33 (*)    Retic Count, Absolute 354.2 (*)    All other components within normal limits  I-STAT BETA HCG BLOOD, ED ([facility], [facility], [facility] ONLY)    EKG  EKG Interpretation None       Radiology No results found.  Procedures Procedures (including critical care time)  Medications Ordered in ED Medications  0.45 % sodium chloride infusion ( Intravenous New Bag/Given 07/17/17 0257)  diphenhydrAMINE (BENADRYL) capsule 25-50 mg (25 mg Oral Given 07/17/17 0258)  HYDROmorphone (DILAUDID) injection 2 mg (2 mg Intravenous Given 07/17/17 0258)    Or  HYDROmorphone (DILAUDID) injection 2 mg ( Subcutaneous See Alternative 07/17/17 0258)  HYDROmorphone (DILAUDID) injection 2 mg (2 mg Intravenous Given 07/17/17 0330)    Or  HYDROmorphone (DILAUDID) injection 2 mg ( Subcutaneous See Alternative 07/17/17 0330)  HYDROmorphone (DILAUDID)  injection 2 mg (2 mg Intravenous Given 07/17/17 0411)    Or  HYDROmorphone (DILAUDID) injection 2 mg ( Subcutaneous See Alternative 07/17/17 0411)  ketorolac (TORADOL) 30 MG/ML injection 30 mg (30 mg Intravenous Given 07/17/17 0258)  ondansetron (ZOFRAN) injection 4 mg (4 mg Intravenous Given 07/17/17 0258)     Initial Impression / Assessment and Plan / ED Course  I have reviewed the triage vital signs and the nursing notes.  Pertinent labs & imaging results that were available during my care of the patient were reviewed  by me and considered in my medical decision making (see chart for details).     Patient seen for pain in back typical of sickle cell pain. No fever or chest pain.   Pain management per protocol provided with only minimal relief in pain. She feels she needs admission for better control.   Discussed with Dr. Blaine Hamper who accepts the patient onto his service.   Final Clinical Impressions(s) / ED Diagnoses   Final diagnoses:  None   1. Sickle cell anemia with pain  ED Discharge Orders    None       Charlann Lange, PA-C 07/17/17 0516    Shanon Rosser, MD 07/17/17 [phone]

## 2017-07-18 ENCOUNTER — Inpatient Hospital Stay (HOSPITAL_COMMUNITY): Payer: Self-pay

## 2017-07-18 DIAGNOSIS — D5700 Hb-SS disease with crisis, unspecified: Principal | ICD-10-CM

## 2017-07-18 DIAGNOSIS — D564 Hereditary persistence of fetal hemoglobin [HPFH]: Secondary | ICD-10-CM

## 2017-07-18 DIAGNOSIS — Z598 Other problems related to housing and economic circumstances: Secondary | ICD-10-CM

## 2017-07-18 DIAGNOSIS — K5900 Constipation, unspecified: Secondary | ICD-10-CM | POA: Diagnosis present

## 2017-07-18 LAB — DIFFERENTIAL
Basophils Absolute: 0.1 10*3/uL (ref 0.0–0.1)
Basophils Relative: 1 %
EOS ABS: 0.2 10*3/uL (ref 0.0–0.7)
Eosinophils Relative: 2 %
LYMPHS PCT: 52 %
Lymphs Abs: 4.5 10*3/uL — ABNORMAL HIGH (ref 0.7–4.0)
MONO ABS: 1 10*3/uL (ref 0.1–1.0)
Monocytes Relative: 12 %
NEUTROS PCT: 33 %
NRBC: 5 /100{WBCs} — AB
Neutro Abs: 2.9 10*3/uL (ref 1.7–7.7)

## 2017-07-18 LAB — CBC
HCT: 21.7 % — ABNORMAL LOW (ref 36.0–46.0)
HEMOGLOBIN: 7.6 g/dL — AB (ref 12.0–15.0)
MCH: 34.9 pg — AB (ref 26.0–34.0)
MCHC: 35 g/dL (ref 30.0–36.0)
MCV: 99.5 fL (ref 78.0–100.0)
Platelets: 392 10*3/uL (ref 150–400)
RBC: 2.18 MIL/uL — AB (ref 3.87–5.11)
RDW: 20.7 % — ABNORMAL HIGH (ref 11.5–15.5)
WBC: 8.7 10*3/uL (ref 4.0–10.5)

## 2017-07-18 LAB — COMPREHENSIVE METABOLIC PANEL
ALT: 23 U/L (ref 14–54)
ANION GAP: 7 (ref 5–15)
AST: 41 U/L (ref 15–41)
Albumin: 3.9 g/dL (ref 3.5–5.0)
Alkaline Phosphatase: 71 U/L (ref 38–126)
BUN: 5 mg/dL — ABNORMAL LOW (ref 6–20)
CALCIUM: 8.6 mg/dL — AB (ref 8.9–10.3)
CHLORIDE: 108 mmol/L (ref 101–111)
CO2: 22 mmol/L (ref 22–32)
Creatinine, Ser: <0.3 mg/dL — ABNORMAL LOW (ref 0.44–1.00)
Glucose, Bld: 102 mg/dL — ABNORMAL HIGH (ref 65–99)
POTASSIUM: 3.3 mmol/L — AB (ref 3.5–5.1)
SODIUM: 137 mmol/L (ref 135–145)
Total Bilirubin: 3.6 mg/dL — ABNORMAL HIGH (ref 0.3–1.2)
Total Protein: 6.9 g/dL (ref 6.5–8.1)

## 2017-07-18 LAB — RETICULOCYTES
RBC.: 2.18 MIL/uL — ABNORMAL LOW (ref 3.87–5.11)
RETIC CT PCT: 15 % — AB (ref 0.4–3.1)
Retic Count, Absolute: 327 10*3/uL — ABNORMAL HIGH (ref 19.0–186.0)

## 2017-07-18 MED ORDER — HYDROMORPHONE HCL 2 MG PO TABS: 2.0000 mg | ORAL_TABLET | ORAL | Status: DC

## 2017-07-18 MED ORDER — HYDROMORPHONE 1 MG/ML IV SOLN
INTRAVENOUS | Status: DC
  Administered 2017-07-18: 6.5 mg via INTRAVENOUS
  Administered 2017-07-18: 14:00:00 via INTRAVENOUS
  Administered 2017-07-18: 4.5 mg via INTRAVENOUS
  Administered 2017-07-19: 4 mg via INTRAVENOUS
  Administered 2017-07-19: 5 mg via INTRAVENOUS
  Administered 2017-07-19 (×2): 2.5 mg via INTRAVENOUS
  Administered 2017-07-19: 3.5 mg via INTRAVENOUS
  Filled 2017-07-18: qty 25

## 2017-07-18 MED ORDER — HYDROMORPHONE HCL 4 MG PO TABS
2.0000 mg | ORAL_TABLET | ORAL | Status: DC
  Administered 2017-07-18 – 2017-07-20 (×11): 2 mg via ORAL
  Filled 2017-07-18 (×12): qty 1

## 2017-07-18 NOTE — Progress Notes (Addendum)
Spoke with patient at bedside about medications, she states she told attending she had concerns about being able to afford her pain meds at d/c. She confirms she has no insurance or medication coverage, states she was not born in [place] so is not eligible for Medicaid. She says she has since spoken with a friend who says they will give her the money to get them. No further d/c concerns at this time. [phone]

## 2017-07-18 NOTE — Progress Notes (Signed)
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 Hb SS with Crisis: I will schedule oral Dilaudid 2 mg every 4 hours, and schedule oral Naprosyn.  Decrease IV fluids to KVO. 2. Anemia of Chronic Disease: Hemoglobin is currently at baseline. 3. Inability to afford medications: Patient has been unable to afford her medications but thought she could get assistance from an outside agency.  During 2 previous admissions to be arranged with the sickle cell agency to assist the patient with obtaining her hydroxyurea.  At that time it was noted that she does have a purchase of medical care coverage until the end of the year.  At the time of her discharge I will plan to give her a prescription for her hydromorphone and I will speak with her primary prescriber at [facility] about giving her a prescription for a full month instead of just 5 days is recommended after an acute hospitalization.  I have checked PMP aware and the patient has no recent prescriptions for opiates.    Code Status: Full Code Family Communication: N/A Disposition Plan: Not yet ready for discharge  [place].  Pager [phone]. If 7PM-7AM, please contact night-coverage.  07/18/2017, 11:49 AM  LOS: 1 day   Interim History: The patient with hemoglobin SS and HPFH who was discharged from the hospital 1 week ago after having a sickle cell crisis.  The patient reports that she had no pain medicine at home and her pain started back 2 nights ago with an intensity of 10/10.  She had no medications to manage pain at home she presented to the emergency department with sickle cell crisis and was again admitted for the same.  Patient  admits that she has no pain medication at home and did not disclose this on her previous admission at the time of discharge as she thought she could receive assistance from a crisis control Center in [place] which had assisted in providing her pain medications in the past.  However this center apparently is no longer in existence and the patient went without pain medication.  She reports that she has been adherent to hydroxyurea.  Today she reports pain in the left leg at 5/10 and the low back at 7/10.  She reports that the pain and tightness in her chest has resolved.  Consultants:  None  Procedures:  None  Antibiotics:  None   Objective: Vitals:   07/18/17 0431 07/18/17 0551 07/18/17 0814 07/18/17 1000  BP:  94/64  96/65  Pulse:  74    Resp: 17 16 12 14   Temp:  98.9 F (37.2 C)  98 F (36.7 C)  TempSrc:  Oral  Oral  SpO2: 100% 100% 100% 99%  Weight:      Height:       Weight change:   Intake/Output Summary (Last 24 hours) at 07/18/2017 1149 Last data filed at 07/18/2017 0600 [place] per 24 hour  Intake 3720 ml  Output 1950 ml  Net 1770 ml     Physical Exam General: Alert, awake, oriented x3, in mild distress secondary to pain.  HEENT: [place]/AT PEERL, EOMI, anicteric Neck: Trachea midline,  no masses, no thyromegal,y no JVD, no carotid bruit OROPHARYNX:  Moist, No exudate/ erythema/lesions.  Heart: Regular rate and rhythm, without murmurs, rubs, gallops, PMI non-displaced, no heaves or thrills on palpation.  Lungs: Clear to auscultation, no wheezing or rhonchi noted. No increased vocal fremitus resonant to percussion  Abdomen: Soft, nontender, nondistended, positive bowel sounds, no masses no hepatosplenomegaly noted..  Neuro: No focal neurological deficits noted cranial nerves II through XII grossly intact.  Strength at functional baseline in bilateral upper and lower extremities. Musculoskeletal: No warmth swelling or erythema around joints, no spinal tenderness  noted. Psychiatric: Patient alert and oriented x3, good insight and cognition, good recent to remote recall.     Data Reviewed: Basic Metabolic Panel: Recent Labs  Lab 07/17/17 0224 07/18/17 0525  NA 140 137  K 3.2* 3.3*  CL 109 108  CO2 25 22  GLUCOSE 86 102*  BUN 10 5*  CREATININE 0.36* <0.30*  CALCIUM 9.3 8.6*   Liver Function Tests: Recent Labs  Lab 07/17/17 0224 07/18/17 0525  AST 42* 41  ALT 30 23  ALKPHOS 90 71  BILITOT 5.0* 3.6*  PROT 8.3* 6.9  ALBUMIN 4.8 3.9   No results for input(s): LIPASE, AMYLASE in the last 168 hours. No results for input(s): AMMONIA in the last 168 hours. CBC: Recent Labs  Lab 07/12/17 0422 07/17/17 0224 07/18/17 0525  WBC 7.2 12.3* 8.7  NEUTROABS 3.0 6.5 2.9  HGB 8.3* 8.4* 7.6*  HCT 23.3* 23.4* 21.7*  MCV 101.3* 100.4* 99.5  PLT 433* 451* 392   Cardiac Enzymes: No results for input(s): CKTOTAL, CKMB, CKMBINDEX, TROPONINI in the last 168 hours. BNP (last 3 results) No results for input(s): BNP in the last 8760 hours.  ProBNP (last 3 results) No results for input(s): PROBNP in the last 8760 hours.  CBG: No results for input(s): GLUCAP in the last 168 hours.  Recent Results (from the past 240 hour(s))  Wound or Superficial Culture     Status: None   Collection Time: 07/09/17 10:00 PM  Result Value Ref Range Status   Specimen Description ABSCESS  Final   Special Requests Normal  Final   Gram Stain   Final    DEGENERATED CELLULAR MATERIAL PRESENT RARE GRAM POSITIVE COCCI IN CLUSTERS    Culture   Final    NORMAL SKIN FLORA Performed at [facility], [place] [address] [place], [place] [address]    Report Status 07/13/2017 FINAL  Final     Studies: X-ray Chest Pa And Lateral  Result Date: 07/18/2017 CLINICAL DATA:  History of sickle cell disease.  Chest pain. EXAM: CHEST  2 VIEW COMPARISON:  PA and lateral chest 07/09/2017 03/26/2016. FINDINGS: The lungs are clear. Heart size is mildly enlarged. No  pneumothorax or pleural effusion. No acute bony abnormality. IMPRESSION: No acute disease. Electronically Signed   By: Inge Rise M.D.   On: 07/18/2017 10:26   Dg Chest 2 View  Result Date: 07/09/2017 CLINICAL DATA:  26 year old female with central chest pain and shortness of breath since yesterday. History of sickle cell disease. EXAM: CHEST  2 VIEW COMPARISON:  06/24/2017 FINDINGS: Cardiomediastinal silhouette is borderline enlarged but stable. No focal parenchymal opacity, pleural effusion or pneumothorax. There is again mild retrocardiac atelectasis. No acute osseous abnormalities. IMPRESSION: Mild, stable cardiomegaly without active cardiopulmonary disease. Electronically Signed   By: Kristopher Oppenheim M.D.   On: 07/09/2017 19:34   Dg Chest 2 View  Result Date: 06/24/2017 CLINICAL DATA:  Sickle-cell crisis EXAM: CHEST  2 VIEW COMPARISON:  None. FINDINGS:  Stable cardiomegaly. No aortic aneurysm. No pneumonic consolidation, effusion or pneumothorax. No overt pulmonary edema. No acute nor suspicious osseous abnormalities. Cholecystectomy clips are seen in the right upper quadrant. IMPRESSION: No active cardiopulmonary disease.  Stable cardiomegaly. Electronically Signed   By: Ashley Royalty M.D.   On: 06/24/2017 23:37    Scheduled Meds: . enoxaparin (LOVENOX) injection  40 mg Subcutaneous Q24H  . folic acid  2 mg Oral Daily  . HYDROmorphone   Intravenous Q4H  . hydroxyurea  1,000 mg Oral Daily   Continuous Infusions: . 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 Greater than 50% involved face to face contact with the patient for assessment, counseling and coordination of care.

## 2017-07-19 DIAGNOSIS — D638 Anemia in other chronic diseases classified elsewhere: Secondary | ICD-10-CM

## 2017-07-19 DIAGNOSIS — K5900 Constipation, unspecified: Secondary | ICD-10-CM

## 2017-07-19 DIAGNOSIS — E876 Hypokalemia: Secondary | ICD-10-CM

## 2017-07-19 MED ORDER — HYDROMORPHONE 1 MG/ML IV SOLN
INTRAVENOUS | Status: DC
  Administered 2017-07-19: 2 mg via INTRAVENOUS
  Administered 2017-07-19: 2.48 mg via INTRAVENOUS
  Administered 2017-07-20: 0 mg via INTRAVENOUS
  Administered 2017-07-20: 2 mg via INTRAVENOUS
  Administered 2017-07-20: 4 mg via INTRAVENOUS
  Administered 2017-07-20: 0.5 mg via INTRAVENOUS
  Filled 2017-07-19: qty 25

## 2017-07-19 NOTE — Progress Notes (Signed)
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 Hb SS with Crisis:Continue Dilaudid 2 mg every 4 hours, and Naprosyn. Continue to wean PCA. Anticipate discharge home tomorrow   2. Anemia of Chronic Disease: Recheck CBC with diff and reticulocytes tomorrow before discharge. 3. Hypokalemia: Continue replacement and recheck tomorrow.  4. Constipation: Pt refuses laxatives.    Code Status: Full Code Family Communication: N/A Disposition Plan: Anticipate discharge tomorrow  Lanah Steines A.  Pager [phone]. If 7PM-7AM, please contact night-coverage.  07/19/2017, 5:16 PM  LOS: 2 days   Interim History: Today she reports pain in the low back at 4/10. She still has not had a BM but is refusing laxatives.   Consultants:  None  Procedures:  None  Antibiotics:  None   Objective: Vitals:   07/19/17 0953 07/19/17 1214 07/19/17 1511 07/19/17 1637  BP: 106/62  (!) 90/53   Pulse: 81  86   Resp: 16 11 16 14   Temp: 98.9 F (37.2 C)  99.6 F (37.6 C)   TempSrc: Oral  Oral   SpO2: 100% 99% 98% 96%  Weight:      Height:       Weight change:  No intake or output data in the 24 hours ending 07/19/17 1716   Physical Exam General: Alert, awake, oriented x3, in no apparent distress. Was up ambulating in halls without difficulty. HEENT: [place]/AT [place], EOMI, mild icterus Neck: Trachea midline, no masses, no thyromegal,y no JVD, no carotid bruit OROPHARYNX:  Moist, No exudate/ erythema/lesions.  Heart: Regular rate and rhythm, without murmurs, rubs, gallops, PMI non-displaced, no heaves or thrills on palpation.  Lungs: Clear to auscultation, no wheezing or rhonchi noted. No  increased vocal fremitus resonant to percussion  Abdomen: Soft, nontender, nondistended, positive bowel sounds, no masses no hepatosplenomegaly noted.  Neuro: No focal neurological deficits noted cranial nerves II through XII grossly intact.  Strength at functional baseline in bilateral upper and lower extremities. Musculoskeletal: No warmth swelling or erythema around joints, no spinal tenderness noted. Psychiatric: Patient alert and oriented x3, good insight and cognition, good recent to remote recall.     Data Reviewed: Basic Metabolic Panel: Recent Labs  Lab 07/17/17 0224 07/18/17 0525  NA 140 137  K 3.2* 3.3*  CL 109 108  CO2 25 22  GLUCOSE 86 102*  BUN 10 5*  CREATININE 0.36* <0.30*  CALCIUM 9.3 8.6*   Liver Function Tests: Recent Labs  Lab 07/17/17 0224 07/18/17 0525  AST 42* 41  ALT 30 23  ALKPHOS 90 71  BILITOT 5.0* 3.6*  PROT 8.3* 6.9  ALBUMIN 4.8 3.9   No results for input(s): LIPASE, AMYLASE in the last 168 hours. No results for input(s): AMMONIA in the last 168 hours. CBC: Recent Labs  Lab 07/17/17 0224 07/18/17 0525  WBC 12.3* 8.7  NEUTROABS 6.5 2.9  HGB 8.4* 7.6*  HCT 23.4* 21.7*  MCV 100.4* 99.5  PLT 451* 392   Cardiac Enzymes: No results for input(s): CKTOTAL, CKMB, CKMBINDEX, TROPONINI in the last 168 hours. BNP (last 3 results) No results for input(s): BNP in the last 8760 hours.  ProBNP (last 3 results) No results for input(s): PROBNP in the last 8760 hours.  CBG: No results for input(s): GLUCAP in the last 168 hours.  Recent Results (from the past 240 hour(s))  Wound or Superficial Culture     Status: None   Collection Time: 07/09/17 10:00 PM  Result Value Ref Range Status   Specimen Description ABSCESS  Final   Special Requests Normal  Final   Gram Stain   Final    DEGENERATED CELLULAR MATERIAL PRESENT RARE GRAM POSITIVE COCCI IN CLUSTERS    Culture   Final    NORMAL SKIN FLORA Performed at [place] [facility], [place]  [address] [place], [place] [address]    Report Status 07/13/2017 FINAL  Final     Studies: X-ray Chest Pa And Lateral  Result Date: 07/18/2017 CLINICAL DATA:  History of sickle cell disease.  Chest pain. EXAM: CHEST  2 VIEW COMPARISON:  PA and lateral chest 07/09/2017 03/26/2016. FINDINGS: The lungs are clear. Heart size is mildly enlarged. No pneumothorax or pleural effusion. No acute bony abnormality. IMPRESSION: No acute disease. Electronically Signed   By: Inge Rise M.D.   On: 07/18/2017 10:26   Dg Chest 2 View  Result Date: 07/09/2017 CLINICAL DATA:  26 year old female with central chest pain and shortness of breath since yesterday. History of sickle cell disease. EXAM: CHEST  2 VIEW COMPARISON:  06/24/2017 FINDINGS: Cardiomediastinal silhouette is borderline enlarged but stable. No focal parenchymal opacity, pleural effusion or pneumothorax. There is again mild retrocardiac atelectasis. No acute osseous abnormalities. IMPRESSION: Mild, stable cardiomegaly without active cardiopulmonary disease. Electronically Signed   By: Kristopher Oppenheim M.D.   On: 07/09/2017 19:34   Dg Chest 2 View  Result Date: 06/24/2017 CLINICAL DATA:  Sickle-cell crisis EXAM: CHEST  2 VIEW COMPARISON:  None. FINDINGS: Stable cardiomegaly. No aortic aneurysm. No pneumonic consolidation, effusion or pneumothorax. No overt pulmonary edema. No acute nor suspicious osseous abnormalities. Cholecystectomy clips are seen in the right upper quadrant. IMPRESSION: No active cardiopulmonary disease.  Stable cardiomegaly. Electronically Signed   By: Ashley Royalty M.D.   On: 06/24/2017 23:37    Scheduled Meds: . enoxaparin (LOVENOX) injection  40 mg Subcutaneous Q24H  . folic acid  2 mg Oral Daily  . HYDROmorphone   Intravenous Q4H  . HYDROmorphone  2 mg Oral Q4H  . hydroxyurea  1,000 mg Oral Daily   Continuous Infusions: . dextrose 5 % and 0.45% NaCl 20 mL/hr at 07/18/17 1400    Active Problems:   Sickle cell disease with  hereditary persistence of fetal hemoglobin (HPFH) with crisis (HCC)   Sickle cell anemia with pain (HCC)   Sickle cell pain crisis (HCC)   Sickle cell crisis ([place])   In excess of 25 minutes spent during this visit. Greater than 50% involved face to face contact with the patient for assessment, counseling and coordination of care.

## 2017-07-19 NOTE — Progress Notes (Signed)
Nurse tech at bedside to offer ambulation and patient refused at this time. Stating that she felt under the weather and does not want to walk.

## 2017-07-20 LAB — BASIC METABOLIC PANEL
ANION GAP: 7 (ref 5–15)
BUN: 7 mg/dL (ref 6–20)
CO2: 26 mmol/L (ref 22–32)
Calcium: 9 mg/dL (ref 8.9–10.3)
Chloride: 104 mmol/L (ref 101–111)
Creatinine, Ser: <0.3 mg/dL — ABNORMAL LOW (ref 0.44–1.00)
GLUCOSE: 92 mg/dL (ref 65–99)
POTASSIUM: 3.3 mmol/L — AB (ref 3.5–5.1)
Sodium: 137 mmol/L (ref 135–145)

## 2017-07-20 LAB — RETICULOCYTES
RBC.: 2.1 MIL/uL — ABNORMAL LOW (ref 3.87–5.11)
RETIC CT PCT: 16.5 % — AB (ref 0.4–3.1)
Retic Count, Absolute: 346.5 10*3/uL — ABNORMAL HIGH (ref 19.0–186.0)

## 2017-07-20 LAB — MAGNESIUM: Magnesium: 1.8 mg/dL (ref 1.7–2.4)

## 2017-07-20 MED ORDER — POTASSIUM CHLORIDE ER 20 MEQ PO TBCR: 10.0000 meq | EXTENDED_RELEASE_TABLET | Freq: Every day | ORAL | 1 refills | Status: DC

## 2017-07-20 NOTE — Discharge Summary (Signed)
Gabriella Griffin MRN: [id number] DOB/AGE: Jun 13, 1991 26 y.o.  Admit date: 07/17/2017 Discharge date: 07/20/2017  Primary Care Physician:  Patient, No Pcp Per   Discharge Diagnoses:   Patient Active Problem List   Diagnosis Date Noted  . Constipation 07/18/2017  . Anemia of chronic disease 07/17/2017  . Hypokalemia 07/17/2017  . 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 What changed:  how much to take   HYDROmorphone 2 MG tablet Commonly known as:  DILAUDID Take 1 tablet (2 mg total) by mouth every 4 (four) hours as needed for severe pain.   hydroxyurea 500 MG capsule Commonly known as:  HYDREA Take 1 capsule (500 mg total) by mouth daily. May take with food to minimize GI side effects. What changed:    how much to take  additional instructions   naproxen sodium 220 MG tablet Commonly known as:  ALEVE Take 440 mg by mouth daily as needed (pain).   Potassium Chloride ER 20 MEQ Tbcr Take 10 mEq by mouth daily.         Consults:    SIGNIFICANT DIAGNOSTIC STUDIES:  X-ray Chest Pa And Lateral  Result Date: 07/18/2017 CLINICAL DATA:  History of sickle cell disease.  Chest pain. EXAM: CHEST  2 VIEW COMPARISON:  PA and lateral chest 07/09/2017 03/26/2016. FINDINGS: The lungs are clear. Heart size is mildly enlarged. No pneumothorax or pleural effusion. No acute bony abnormality. IMPRESSION: No acute disease. Electronically Signed   By: Inge Rise M.D.   On: 07/18/2017 10:26   Dg Chest 2  View  Result Date: 07/09/2017 CLINICAL DATA:  26 year old female with central chest pain and shortness of breath since yesterday. History of sickle cell disease. EXAM: CHEST  2 VIEW COMPARISON:  06/24/2017 FINDINGS: Cardiomediastinal silhouette is borderline enlarged but stable. No focal parenchymal opacity, pleural effusion or pneumothorax. There is again mild retrocardiac atelectasis. No acute osseous abnormalities. IMPRESSION: Mild, stable cardiomegaly without active cardiopulmonary disease. Electronically Signed   By: Kristopher Oppenheim M.D.   On: 07/09/2017 19:34   Dg Chest 2 View  Result Date: 06/24/2017 CLINICAL DATA:  Sickle-cell crisis EXAM: CHEST  2 VIEW COMPARISON:  None. FINDINGS: Stable cardiomegaly. No aortic aneurysm. No pneumonic consolidation, effusion or pneumothorax. No overt pulmonary edema. No acute nor suspicious osseous abnormalities. Cholecystectomy clips are seen in the right upper quadrant. IMPRESSION: No active cardiopulmonary disease.  Stable cardiomegaly. Electronically Signed   By: Ashley Royalty M.D.   On: 06/24/2017 23:37       No results found for this or any previous visit (from the past 240 hour(s)).  BRIEF ADMITTING H & P: Gabriella Griffin is a 26 y.o. female with medical history significant sickle cell anemia followed at [facility] by hematology, presenting today with severe back pain, and shoulder pain, similar to the symptoms during crisis.  She reports that due to the weather, her symptoms have become more frequent.  This year she had 6 crisis, last event  on 07/12/2017 requiring hospitalization.  She denies any chest pain or palpitations.  She denies any shortness of breath or cough.  She reports subjective fevers, but no chills.  She denies any abdominal pain nausea or vomiting.  She denies any lower extremity swelling or calf pain.  Denies any headaches or vision changes. she reports being compliant with Hydrea.  Denies tobacco, alcohol or recreational  drug use.     Hospital Course:  Present on Admission: . (Resolved) Sickle cell pain crisis ([place]) . 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 Anemia of chronic disease . Constipation . Hypokalemia  This is a young lady with hemoglobin SS with HPFH who was readmitted with sickle cell crisis after having been discharged only 1 week prior.  Patient states that she was feeling well after discharge however she had no medications to manage her pain at home as she was unable to afford the prescription.  Her hospital course was essentially unremarkable except for the management of pain.  The pain was managed with Dilaudid via the PCA, Naprosyn and IV fluids (patient has just received a 5-day course of Toradol).  As the pain improved transition was made to oral medications which included Dilaudid 2 mg p.o. every 4 hours as needed.  At the time of discharge she reported pain at a level of 2/10.  I spoke with her prescribing physician Dr. Griffin Basil at [facility] and he will ensure that the prescription is available for this patient to obtain her oral analgesics.  She was discharged with a prescription for Naprosyn to be used for mild pain  Patient was also found to be hypokalemic on admission.  Placement was made orally and at the time of discharge patient still had a mild hypokalemia of 3.3.  She is discharged home with a prescription for oral potassium and is advised to continue on the oral potassium until she is reevaluated by her hematologist.  The patient does not have a primary care physician and an appointment was offered within her system which the patient declined at this time.  Patient has anemia of chronic disease and at the time of discharge her hemoglobin was stable with a hemoglobin of 7.7 g/dL and a reticulocyte of 16.5%.  Prior to hospitalization she had been without her hydroxyurea and was not taking it.  It was  reinstated here in the hospital and she is to follow-up with her hematologist at [facility] for continuation of hydroxyurea.  The patient also had constipation and had no bowel movements during her hospitalization.  She was receiving senna-S throughout her hospital course.  Laxatives were offered however the patient refused any further laxative management during her hospitalization.  Disposition and Follow-up: The patient is discharged home in good condition and is to follow-up with her hematologist as scheduled.  The patient is encouraged to pursue an appointment with a primary care physician as she has refused an appointment within our system.  She will speak with the case manager at the sickle cell agency to assist in that process of obtaining an appointment within a week for system as that is where she sees her hematologist.   Discharge Instructions    Activity as tolerated - No restrictions   Complete by:  As directed    Diet general   Complete by:  As directed       DISCHARGE EXAM:  General: Alert, awake, oriented x 3.  Patient is well-appearing and  in no distress.HEENT: [place]/AT PEERL, EOMI, anicteric Neck: Trachea midline, no masses, no thyromegal,y no JVD, no carotid bruit OROPHARYNX: Moist, No exudate/ erythema/lesions.  Heart: Regular rate and rhythm, without murmurs, rubs, gallops or S3. PMI non-displaced. Exam reveals no decreased pulses. Pulmonary/Chest: Normal effort. Breath sounds normal. No. Apnea. Clear to auscultation,no stridor,  no wheezing and no rhonchi noted. No respiratory distress and no tenderness noted. Abdomen: Soft, nontender, nondistended, normal bowel sounds, no masses no hepatosplenomegaly noted. No fluid wave and no ascites. There is no guarding or rebound. Neuro: Alert and oriented to person, place and time. Normal motor skills, Displays no atrophy or tremors and exhibits normal muscle tone.  No focal neurological deficits noted cranial nerves II through XII grossly  intact. No sensory deficit noted. DTRs 2+ bilaterally upper and lower extremities. Strength at baseline in bilateral upper and lower extremities. Gait normal. Musculoskeletal: No warm swelling or erythema around joints, no spinal tenderness noted. Psychiatric: Patient alert and oriented x3, good insight and cognition, good recent to remote recall. Mood, memory, affect and judgement normal Lymph node survey: No cervical axillary or inguinal lymphadenopathy noted. Skin: Skin is warm and dry. No bruising, no ecchymosis and no rash noted. Pt is not diaphoretic. No erythema. No pallor    Blood pressure 108/64, pulse 82, temperature 99.3 F (37.4 C), temperature source Oral, resp. rate 11, height 5\' 2"  (1.575 m), weight 52.2 kg (115 lb 1.3 oz), last menstrual period 06/25/2017, SpO2 94 %, unknown if currently breastfeeding.  Recent Labs    07/18/17 0525 07/20/17 0407  NA 137 137  K 3.3* 3.3*  CL 108 104  CO2 22 26  GLUCOSE 102* 92  BUN 5* 7  CREATININE <0.30* <0.30*  CALCIUM 8.6* 9.0  MG  --  1.8   Recent Labs    07/18/17 0525  AST 41  ALT 23  ALKPHOS 71  BILITOT 3.6*  PROT 6.9  ALBUMIN 3.9   No results for input(s): LIPASE, AMYLASE in the last 72 hours. Recent Labs    07/18/17 0525 07/20/17 0407  WBC 8.7 9.4  NEUTROABS 2.9 5.1  HGB 7.6* 7.7*  HCT 21.7* 20.8*  MCV 99.5 99.0  PLT 392 324     Total time spent including face to face and decision making was greater than 30 minutes  Signed: MATTHEWS,MICHELLE A. 07/20/2017, 1:23 PM

## 2017-07-21 LAB — CBC WITH DIFFERENTIAL/PLATELET
BASOS PCT: 1 %
Basophils Absolute: 0.1 10*3/uL (ref 0.0–0.1)
EOS ABS: 0.3 10*3/uL (ref 0.0–0.7)
EOS PCT: 3 %
HEMATOCRIT: 20.8 % — AB (ref 36.0–46.0)
HEMOGLOBIN: 7.7 g/dL — AB (ref 12.0–15.0)
LYMPHS PCT: 33 %
Lymphs Abs: 3.1 10*3/uL (ref 0.7–4.0)
MCH: 36.7 pg — AB (ref 26.0–34.0)
MCHC: 37 g/dL — ABNORMAL HIGH (ref 30.0–36.0)
MCV: 99 fL (ref 78.0–100.0)
MONOS PCT: 8 %
Monocytes Absolute: 0.8 10*3/uL (ref 0.1–1.0)
NRBC: 2 /100{WBCs} — AB
Neutro Abs: 5.1 10*3/uL (ref 1.7–7.7)
Neutrophils Relative %: 55 %
Platelets: 324 10*3/uL (ref 150–400)
RBC: 2.1 MIL/uL — ABNORMAL LOW (ref 3.87–5.11)
RDW: 21.2 % — ABNORMAL HIGH (ref 11.5–15.5)
WBC: 9.4 10*3/uL (ref 4.0–10.5)

## 2017-07-31 ENCOUNTER — Inpatient Hospital Stay (HOSPITAL_COMMUNITY): Payer: Self-pay

## 2017-07-31 ENCOUNTER — Encounter (HOSPITAL_COMMUNITY): Payer: Self-pay | Admitting: Emergency Medicine

## 2017-07-31 ENCOUNTER — Inpatient Hospital Stay (HOSPITAL_COMMUNITY)
Admission: EM | Admit: 2017-07-31 | Discharge: 2017-08-03 | DRG: 812 | Disposition: A | Discharge status: Home / Self Care | Payer: Self-pay | Attending: Internal Medicine | Admitting: Internal Medicine

## 2017-07-31 DIAGNOSIS — D62 Acute posthemorrhagic anemia: Secondary | ICD-10-CM | POA: Diagnosis present

## 2017-07-31 DIAGNOSIS — Z881 Allergy status to other antibiotic agents status: Secondary | ICD-10-CM

## 2017-07-31 DIAGNOSIS — D5700 Hb-SS disease with crisis, unspecified: Principal | ICD-10-CM | POA: Diagnosis present

## 2017-07-31 DIAGNOSIS — R0902 Hypoxemia: Secondary | ICD-10-CM | POA: Diagnosis present

## 2017-07-31 DIAGNOSIS — D75839 Thrombocytosis, unspecified: Secondary | ICD-10-CM

## 2017-07-31 DIAGNOSIS — D473 Essential (hemorrhagic) thrombocythemia: Secondary | ICD-10-CM

## 2017-07-31 DIAGNOSIS — Z832 Family history of diseases of the blood and blood-forming organs and certain disorders involving the immune mechanism: Secondary | ICD-10-CM

## 2017-07-31 DIAGNOSIS — R0782 Intercostal pain: Secondary | ICD-10-CM

## 2017-07-31 DIAGNOSIS — K5900 Constipation, unspecified: Secondary | ICD-10-CM | POA: Diagnosis present

## 2017-07-31 DIAGNOSIS — R110 Nausea: Secondary | ICD-10-CM

## 2017-07-31 DIAGNOSIS — D638 Anemia in other chronic diseases classified elsewhere: Secondary | ICD-10-CM | POA: Diagnosis present

## 2017-07-31 DIAGNOSIS — Z79899 Other long term (current) drug therapy: Secondary | ICD-10-CM

## 2017-07-31 DIAGNOSIS — R079 Chest pain, unspecified: Secondary | ICD-10-CM

## 2017-07-31 LAB — CBC WITH DIFFERENTIAL/PLATELET
BASOS ABS: 0.1 10*3/uL (ref 0.0–0.1)
BASOS PCT: 1 %
EOS PCT: 1 %
Eosinophils Absolute: 0.1 10*3/uL (ref 0.0–0.7)
HEMATOCRIT: 25.4 % — AB (ref 36.0–46.0)
HEMOGLOBIN: 9.2 g/dL — AB (ref 12.0–15.0)
Lymphocytes Relative: 34 %
Lymphs Abs: 2.8 10*3/uL (ref 0.7–4.0)
MCH: 37.2 pg — ABNORMAL HIGH (ref 26.0–34.0)
MCHC: 36.2 g/dL — ABNORMAL HIGH (ref 30.0–36.0)
MCV: 102.8 fL — ABNORMAL HIGH (ref 78.0–100.0)
MONOS PCT: 14 %
Monocytes Absolute: 1.1 10*3/uL — ABNORMAL HIGH (ref 0.1–1.0)
NEUTROS PCT: 50 %
Neutro Abs: 4 10*3/uL (ref 1.7–7.7)
Platelets: 441 10*3/uL — ABNORMAL HIGH (ref 150–400)
RBC: 2.47 MIL/uL — AB (ref 3.87–5.11)
RDW: 20.7 % — ABNORMAL HIGH (ref 11.5–15.5)
WBC: 8.1 10*3/uL (ref 4.0–10.5)
nRBC: 3 /100 WBC — ABNORMAL HIGH

## 2017-07-31 LAB — COMPREHENSIVE METABOLIC PANEL
ALT: 15 U/L (ref 14–54)
AST: 58 U/L — AB (ref 15–41)
Albumin: 4.4 g/dL (ref 3.5–5.0)
Alkaline Phosphatase: 68 U/L (ref 38–126)
Anion gap: 6 (ref 5–15)
BUN: 6 mg/dL (ref 6–20)
CHLORIDE: 108 mmol/L (ref 101–111)
CO2: 24 mmol/L (ref 22–32)
Calcium: 9 mg/dL (ref 8.9–10.3)
Creatinine, Ser: <0.3 mg/dL — ABNORMAL LOW (ref 0.44–1.00)
Glucose, Bld: 94 mg/dL (ref 65–99)
POTASSIUM: 4.3 mmol/L (ref 3.5–5.1)
SODIUM: 138 mmol/L (ref 135–145)
Total Bilirubin: 4.3 mg/dL — ABNORMAL HIGH (ref 0.3–1.2)
Total Protein: 8 g/dL (ref 6.5–8.1)

## 2017-07-31 LAB — MAGNESIUM: Magnesium: 2 mg/dL (ref 1.7–2.4)

## 2017-07-31 LAB — RETICULOCYTES
RBC.: 2.47 MIL/uL — AB (ref 3.87–5.11)
RETIC COUNT ABSOLUTE: 368 10*3/uL — AB (ref 19.0–186.0)
Retic Ct Pct: 14.9 % — ABNORMAL HIGH (ref 0.4–3.1)

## 2017-07-31 LAB — I-STAT BETA HCG BLOOD, ED (MC, WL, AP ONLY)

## 2017-07-31 LAB — LACTATE DEHYDROGENASE: LDH: 589 U/L — AB (ref 98–192)

## 2017-07-31 LAB — PHOSPHORUS: PHOSPHORUS: 3.5 mg/dL (ref 2.5–4.6)

## 2017-07-31 MED ORDER — HYDROXYUREA 500 MG PO CAPS
1000.0000 mg | ORAL_CAPSULE | Freq: Every day | ORAL | Status: DC
  Administered 2017-08-01 – 2017-08-03 (×3): 1000 mg via ORAL
  Filled 2017-07-31 (×4): qty 2

## 2017-07-31 MED ORDER — HYDROMORPHONE HCL 1 MG/ML IJ SOLN: 1.0000 mg | INTRAMUSCULAR | Status: AC

## 2017-07-31 MED ORDER — KETOROLAC TROMETHAMINE 15 MG/ML IJ SOLN
15.0000 mg | Freq: Four times a day (QID) | INTRAMUSCULAR | Status: DC
  Administered 2017-07-31 – 2017-08-03 (×12): 15 mg via INTRAVENOUS
  Filled 2017-07-31 (×12): qty 1

## 2017-07-31 MED ORDER — HYDROMORPHONE HCL 2 MG/ML IJ SOLN
2.0000 mg | INTRAMUSCULAR | Status: AC
  Administered 2017-07-31: 1 mg via INTRAVENOUS
  Filled 2017-07-31: qty 1

## 2017-07-31 MED ORDER — HYDROMORPHONE HCL 1 MG/ML IJ SOLN
1.0000 mg | INTRAMUSCULAR | Status: AC
  Administered 2017-07-31: 1 mg via INTRAVENOUS
  Filled 2017-07-31: qty 1

## 2017-07-31 MED ORDER — HYDROMORPHONE HCL 2 MG/ML IJ SOLN
2.0000 mg | INTRAMUSCULAR | Status: AC | PRN
  Administered 2017-07-31 (×2): 2 mg via INTRAVENOUS
  Filled 2017-07-31 (×2): qty 1

## 2017-07-31 MED ORDER — HYDROMORPHONE HCL 2 MG/ML IJ SOLN: 2.0000 mg | INTRAMUSCULAR | Status: AC

## 2017-07-31 MED ORDER — ONDANSETRON HCL 4 MG/2ML IJ SOLN
4.0000 mg | INTRAMUSCULAR | Status: DC | PRN
  Filled 2017-07-31: qty 2

## 2017-07-31 MED ORDER — ENOXAPARIN SODIUM 40 MG/0.4ML ~~LOC~~ SOLN
40.0000 mg | SUBCUTANEOUS | Status: DC
  Administered 2017-07-31 – 2017-08-02 (×3): 40 mg via SUBCUTANEOUS
  Filled 2017-07-31 (×3): qty 0.4

## 2017-07-31 MED ORDER — SENNOSIDES-DOCUSATE SODIUM 8.6-50 MG PO TABS
1.0000 | ORAL_TABLET | Freq: Two times a day (BID) | ORAL | Status: DC
  Administered 2017-08-01 – 2017-08-03 (×5): 1 via ORAL
  Filled 2017-07-31 (×6): qty 1

## 2017-07-31 MED ORDER — KCL IN DEXTROSE-NACL 20-5-0.45 MEQ/L-%-% IV SOLN
INTRAVENOUS | Status: DC
  Administered 2017-07-31 – 2017-08-02 (×4): via INTRAVENOUS
  Filled 2017-07-31 (×7): qty 1000

## 2017-07-31 MED ORDER — DIPHENHYDRAMINE HCL 25 MG PO CAPS: 25.0000 mg | ORAL_CAPSULE | ORAL | Status: DC | PRN

## 2017-07-31 MED ORDER — ONDANSETRON HCL 4 MG/2ML IJ SOLN
4.0000 mg | INTRAMUSCULAR | Status: DC | PRN
  Administered 2017-07-31 – 2017-08-03 (×3): 4 mg via INTRAVENOUS
  Filled 2017-07-31 (×2): qty 2

## 2017-07-31 MED ORDER — ONDANSETRON HCL 4 MG/2ML IJ SOLN
4.0000 mg | Freq: Four times a day (QID) | INTRAMUSCULAR | Status: DC | PRN
  Filled 2017-07-31: qty 2

## 2017-07-31 MED ORDER — ONDANSETRON HCL 4 MG PO TABS: 4.0000 mg | ORAL_TABLET | ORAL | Status: DC | PRN

## 2017-07-31 MED ORDER — POLYETHYLENE GLYCOL 3350 17 G PO PACK: 17.0000 g | PACK | Freq: Every day | ORAL | Status: DC | PRN

## 2017-07-31 MED ORDER — SODIUM CHLORIDE 0.9% FLUSH: 9.0000 mL | INTRAVENOUS | Status: DC | PRN

## 2017-07-31 MED ORDER — DIPHENHYDRAMINE HCL 25 MG PO CAPS
25.0000 mg | ORAL_CAPSULE | ORAL | Status: DC | PRN
  Administered 2017-07-31: 25 mg via ORAL
  Filled 2017-07-31: qty 1

## 2017-07-31 MED ORDER — FOLIC ACID 1 MG PO TABS
1.0000 mg | ORAL_TABLET | Freq: Every day | ORAL | Status: DC
  Administered 2017-08-01 – 2017-08-03 (×3): 1 mg via ORAL
  Filled 2017-07-31 (×3): qty 1

## 2017-07-31 MED ORDER — HYDROMORPHONE HCL 2 MG/ML IJ SOLN
2.0000 mg | INTRAMUSCULAR | Status: AC
  Administered 2017-07-31: 2 mg via INTRAVENOUS
  Filled 2017-07-31: qty 1

## 2017-07-31 MED ORDER — DEXTROSE-NACL 5-0.45 % IV SOLN
INTRAVENOUS | Status: DC
  Administered 2017-07-31: 14:00:00 via INTRAVENOUS

## 2017-07-31 MED ORDER — HYDROMORPHONE 1 MG/ML IV SOLN
INTRAVENOUS | Status: DC
  Administered 2017-07-31: 21:00:00 via INTRAVENOUS
  Administered 2017-08-01: 1.5 mg via INTRAVENOUS
  Administered 2017-08-01: 1 mg via INTRAVENOUS
  Administered 2017-08-01: 2.5 mg via INTRAVENOUS
  Administered 2017-08-01: 2 mg via INTRAVENOUS
  Administered 2017-08-01: 2.5 mg via INTRAVENOUS
  Filled 2017-07-31: qty 25

## 2017-07-31 MED ORDER — NALOXONE HCL 0.4 MG/ML IJ SOLN: 0.4000 mg | INTRAMUSCULAR | Status: DC | PRN

## 2017-07-31 MED ORDER — KETOROLAC TROMETHAMINE 30 MG/ML IJ SOLN
30.0000 mg | INTRAMUSCULAR | Status: AC
  Administered 2017-07-31: 30 mg via INTRAVENOUS
  Filled 2017-07-31: qty 1

## 2017-07-31 NOTE — ED Notes (Signed)
Bed: [facility]PT1 Expected date:  Expected time:  Means of arrival:  Comments: 

## 2017-07-31 NOTE — ED Triage Notes (Signed)
Pt comes from home via EMS with complaints of sickle cell pain crisis with primary pain location in her knees and lower back. Ambulatory on arrival.  Vitals WNL.  2L [place] on arrival for comfort but saturating 99%. Was seen 2 weeks ago and was prescribed something for the pain but due to weather she hasn't been able to get her prescriptions filled.

## 2017-07-31 NOTE — ED Provider Notes (Signed)
[facility] DEPT Provider Note   CSN: [id number] Arrival date & time: 07/31/17  1320     History   Chief Complaint Chief Complaint  Patient presents with  . Sickle Cell Pain Crisis    HPI Gabriella Griffin is a 26 y.o. female with a PMHx of sickle cell anemia, who presents to the ED with complaints of sickle cell pain crisis.  Patient states that she has not had her Dilaudid in several months, but has been doing well with taking Aleve.  She was admitted 12/2-5/18 for sickle cell crisis, but has not had a chance to fill her prescriptions due to the weather.  Yesterday she developed low back and bilateral lower leg pain which feels the exact same as her prior sickle cell pain crises.  She describes the pain as 8/10 constant sharp nonradiating lower back and bilateral leg pain, worse with cold exposure, and minimally improved with heat.  She has not tried any other medications for this.  Associated symptoms include chills and nausea.  Chart review reveals that she was admitted for similar complaints on 12/2-5/18, which was her fourth admission this year; usually only has crises in the winter months, usually ~2-4x/year per chart review. She has had increasing frequency in her sickle cell crises this year. She is compliant with her folic acid and hydroxyurea.  She started her menstrual cycle 2 days ago, and states that typically her crises seem to happen around her menses especially in the winter with the cold weather.  She is followed by Dr. Lanell Persons and Dr. Soledad Gerlach at [facility].  She denies any recent travel or elevation changes or sick contacts, and is a non-smoker.  She denies fevers, rhinorrhea, sore throat, URI symptoms, cough, CP, SOB, LE swelling/ulcers, abd pain, V/D/C, hematuria, dysuria, vaginal discharge, rashes, focal arthralgias, numbness, tingling, focal weakness, or any other complaints at this time.    The history is provided by the patient and  medical records. No language interpreter was used.  Sickle Cell Pain Crisis  Location:  Back and lower extremity Severity:  Moderate Onset quality:  Gradual Duration:  1 day Similar to previous crisis episodes: yes   Timing:  Constant Progression:  Unchanged Chronicity:  Recurrent Frequency of attacks:  Rare, mostly in the winter History of pulmonary emboli: no   Context: cold exposure and menses   Relieved by:  Nothing Exacerbated by: cold exposure. Ineffective treatments: heat. Associated symptoms: nausea   Associated symptoms: no chest pain, no cough, no fever, no leg ulcers, no shortness of breath, no sore throat, no swelling of legs and no vomiting   Risk factors: no elevation change, no frequent admissions for pain, no frequent pain crises, no recent air travel and no smoking     Past Medical History:  Diagnosis Date  . Sickle cell anemia (HCC)   . Sickle cell disease [facility])     Patient Active Problem List   Diagnosis Date Noted  . Constipation 07/18/2017  . Anemia of chronic disease 07/17/2017  . Hypokalemia 07/17/2017  . 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 CESAREAN SECTION N/A 08/25/2015   Procedure: CESAREAN SECTION;  Surgeon: [name], MD;  Location: [facility];  Service: Obstetrics;  Laterality: N/A;  . CHOLECYSTECTOMY  2007    OB History    Gravida Para Term Preterm AB Living   2 1 1  1   SAB TAB Ectopic Multiple Live Births         0 1       Home Medications    Prior to Admission medications   Medication Sig Start Date End Date Taking? Authorizing Provider  folic acid (FOLVITE) 1 MG tablet Take 2 tablets (2 mg total) by mouth daily. Patient taking differently: Take 1 mg by mouth daily.  06/16/15   [name], MD  HYDROmorphone (DILAUDID) 2 MG tablet Take 1 tablet (2 mg total) by mouth every 4 (four) hours as  needed for severe pain. 08/27/15   Woodroe Mode, MD  hydroxyurea (HYDREA) 500 MG capsule Take 1 capsule (500 mg total) by mouth daily. May take with food to minimize GI side effects. Patient taking differently: Take 1,000 mg by mouth daily. May take with food to minimize GI side effects. 08/29/15   Seabron Spates, CNM  naproxen sodium (ANAPROX) 220 MG tablet Take 440 mg by mouth daily as needed (pain).    [provider]  potassium chloride 20 MEQ TBCR Take 10 mEq by mouth daily. 07/20/17   [name], MD    Family History Family History  Problem Relation Age of Onset  . Sickle cell anemia Sister   . Diabetes Paternal Grandmother   . Sickle cell trait Mother   . Sickle cell trait Father     Social History Social History   Tobacco Use  . Smoking status: Never Smoker  . Smokeless tobacco: Never Used  Substance Use Topics  . Alcohol use: No  . Drug use: No     Allergies   Ceftin [cefuroxime axetil]   Review of Systems Review of Systems  Constitutional: Positive for chills. Negative for fever.  HENT: Negative for rhinorrhea and sore throat.   Respiratory: Negative for cough and shortness of breath.   Cardiovascular: Negative for chest pain and leg swelling.  Gastrointestinal: Positive for nausea. Negative for abdominal pain, constipation, diarrhea and vomiting.  Genitourinary: Negative for dysuria, hematuria and vaginal discharge.  Musculoskeletal: Positive for back pain and myalgias.  Skin: Negative for color change and rash.  Allergic/Immunologic: Positive for immunocompromised state (sickle cell anemia).  Neurological: Negative for weakness and numbness.  Psychiatric/Behavioral: Negative for confusion.   All other systems reviewed and are negative for acute change except as noted in the HPI.    Physical Exam Updated Vital Signs BP 104/62 (BP Location: Left Arm)   Pulse 88   Temp 98.6 F (37 C) (Oral)   Resp 15   SpO2 96%   Physical Exam    Constitutional: She is oriented to person, place, and time. Vital signs are normal. She appears well-developed and well-nourished.  Non-toxic appearance. No distress.  Afebrile, nontoxic, NAD  HENT:  Head: Normocephalic and atraumatic.  Mouth/Throat: Oropharynx is clear and moist. Mucous membranes are dry.  Slightly dry lips  Eyes: Conjunctivae and EOM are normal. Right eye exhibits no discharge. Left eye exhibits no discharge. Scleral icterus is present.  Scleral icterus present  Neck: Normal range of motion. Neck supple.  Cardiovascular: Normal rate, regular rhythm, normal heart sounds and intact distal pulses. Exam reveals no gallop and no friction rub.  No murmur heard. Pulmonary/Chest: Effort normal and breath sounds normal. No respiratory distress. She has no decreased breath sounds. She has no wheezes. She has no rhonchi. She has no rales.  Abdominal: Soft. Normal appearance and bowel sounds are normal. She exhibits no distension. There is no  tenderness. There is no rigidity, no rebound, no guarding, no CVA tenderness, no tenderness at McBurney's point and negative Murphy's sign.  Musculoskeletal: Normal range of motion.       Lumbar back: She exhibits tenderness and spasm. She exhibits normal range of motion and no bony tenderness.  MAE x4 Strength and sensation grossly intact in all extremities Distal pulses intact No pedal edema, neg homan's bilaterally  Lumbar spine with FROM intact without spinous process TTP, no bony stepoffs or deformities, with mild b/l paraspinous muscle TTP and slight muscle spasms.   Neurological: She is alert and oriented to person, place, and time. She has normal strength. No sensory deficit.  Skin: Skin is warm, dry and intact. No rash noted.  Psychiatric: She has a normal mood and affect.  Nursing note and vitals reviewed.    ED Treatments / Results  Labs (all labs ordered are listed, but only abnormal results are displayed) Labs Reviewed   COMPREHENSIVE METABOLIC PANEL - Abnormal; Notable for the following components:      Result Value   Creatinine, Ser <0.30 (*)    AST 58 (*)    Total Bilirubin 4.3 (*)    All other components within normal limits  CBC WITH DIFFERENTIAL/PLATELET - Abnormal; Notable for the following components:   RBC 2.47 (*)    Hemoglobin 9.2 (*)    HCT 25.4 (*)    MCV 102.8 (*)    MCH 37.2 (*)    MCHC 36.2 (*)    RDW 20.7 (*)    Platelets 441 (*)    nRBC 3 (*)    Monocytes Absolute 1.1 (*)    All other components within normal limits  RETICULOCYTES - Abnormal; Notable for the following components:   Retic Ct Pct 14.9 (*)    RBC. 2.47 (*)    Retic Count, Absolute 368.0 (*)    All other components within normal limits  LACTATE DEHYDROGENASE - Abnormal; Notable for the following components:   LDH 589 (*)    All other components within normal limits  URINALYSIS, ROUTINE W REFLEX MICROSCOPIC  I-STAT BETA HCG BLOOD, ED (MC, [facility], AP ONLY)    EKG  EKG Interpretation None       Radiology No results found.  Procedures Procedures (including critical care time)  Medications Ordered in ED Medications  dextrose 5 %-0.45 % sodium chloride infusion ( Intravenous New Bag/Given 07/31/17 1425)  diphenhydrAMINE (BENADRYL) capsule 25-50 mg (25 mg Oral Given 07/31/17 1539)  ondansetron (ZOFRAN) injection 4 mg (not administered)  HYDROmorphone (DILAUDID) injection 2 mg (not administered)  ketorolac (TORADOL) 30 MG/ML injection 30 mg (30 mg Intravenous Given 07/31/17 1426)  HYDROmorphone (DILAUDID) injection 1 mg (1 mg Intravenous Given 07/31/17 1459)    Or  HYDROmorphone (DILAUDID) injection 1 mg ( Subcutaneous See Alternative 07/31/17 1459)  HYDROmorphone (DILAUDID) injection 1 mg (1 mg Intravenous Given 07/31/17 1539)    Or  HYDROmorphone (DILAUDID) injection 1 mg ( Subcutaneous See Alternative 07/31/17 1539)  HYDROmorphone (DILAUDID) injection 2 mg (1 mg Intravenous Given 07/31/17 1423)    Or   HYDROmorphone (DILAUDID) injection 2 mg ( Subcutaneous See Alternative 07/31/17 1423)  HYDROmorphone (DILAUDID) injection 2 mg (2 mg Intravenous Given 07/31/17 1722)    Or  HYDROmorphone (DILAUDID) injection 2 mg ( Subcutaneous See Alternative 07/31/17 1722)     Initial Impression / Assessment and Plan / ED Course  I have reviewed the triage vital signs and the nursing notes.  Pertinent labs & imaging results  that were available during my care of the patient were reviewed by me and considered in my medical decision making (see chart for details).     26 y.o. female here with sickle cell pain crisis, similar to prior; doesn't come in often, usually in the winter months has more crises (~2-4/year), but this year has had 5 ED visits with 4 admits since September. Hasn't had her dilaudid in several months, has been controlling pain at home usually with aleve. Yesterday started having sickle cell crisis, hasn't taken anything yet. On exam, scleral icterus, mildly dry lips, clear lungs, afebrile and nontoxic, no abdominal tenderness, mild lumbar paraspinous muscle TTP but no midline spinal tenderness, no pedal edema. Will get labs, urine, and give fluids, zofran, and pain meds. Will reassess shortly.   5:56 PM CBC w/diff with Hgb 9.2 which is slightly higher than her usual (typically 7.5-8.8 range over the last several months), no leukocytosis, slight thrombocytosis which has been previously seen. CMP with hyperbilirubinemia close to baseline and marginally elevated AST 58 similar to prior. Retics elevated consistent with prior. LDH elevated, higher than most recent values, but close to values from 05/09/17 and 05/13/17. U/A not yet done, IV fluids weren't going very quickly, increased rate now so hopefully she'll be able to provide a sample soon. BetaHCG neg. Pt still hurting quite a bit after 3 doses of 1mg  dilaudid (nursing staff accidentally gave 3rd dose as 1mg  instead of 2mg , didn't realize the 3rd  dose was ordered for higher dose than the prior two) and a 4th dose of 2mg  dilaudid, in addition to toradol 30mg  IV and fluids. At this point, given her lab results and her intractable pain, will proceed with admission. Will order 2mg  dilaudid now and for another dose if needed in 30 mins, since the 2mg  dosing helped slightly more than 1mg .   6:11 PM Dr. Jonnie Finner of [facility] returning page and will admit. Holding orders to be placed by admitting team. Please see their notes for further documentation of care. I appreciate their help with this pleasant pt's care. Pt stable at time of admission.    Final Clinical Impressions(s) / ED Diagnoses   Final diagnoses:  Sickle cell pain crisis ([place])  Nausea  Hyperbilirubinemia  Thrombocytosis [facility])    ED Discharge Orders    [address], [place], [place] 07/31/17 1811    Gareth Morgan, MD 08/01/17 [phone]

## 2017-07-31 NOTE — H&P (Signed)
History and Physical    Gabriella Griffin ZWC:[id number] DOB: 1991/01/21 DOA: 07/31/2017  PCP: Patient, No Pcp Per Patient coming from: Home  I have personally briefly reviewed patient's old medical records in [facility]  Chief Complaint: Pain  HPI: Gabriella Griffin is a 26 y.o. female with medical history significant of sickle cell disease type SS who follows with Drs. Arlana Pouch at [facility] who presents with 2 days of worsening pain that began in her lower back and bilateral lower extremities, then spread to her bilateral shoulders and upper arms. She experienced mild chest pain as well earlier on the day of presentation, then called EMS for transport to the hospital. She reports that her symptoms often flare with weather changes (cold air, rain). She denies tobacco use. No sick contacts. No fever, chills or infectious symptoms. States that she was admitted 2 weeks ago, and although there was a prescription for Dilaudid sent to a Pharmacy in [place], she did not pick it up because of the bad weather, and has thus been without any pain medication since discharge. She reports adherence with Hydrea. This pain is fairly typical of her normal sickle cell pain episodes.  ED Course: In the ED she was afebrile and normotensive. Labs notable for WBC 8.1, Hgb 9.2 (increased from baseline), platelets 441, LDH 589, TBili 4.3. Other labs unremarkable. She was given multiple doses of IV Dilaudid in the ED along with IV fluids with only minimal improvement in her symptoms.  Review of Systems: As per HPI otherwise 10 point review of systems negative.   Past Medical History:  Diagnosis Date  . Sickle cell anemia (HCC)   . Sickle cell disease ([place])     Past Surgical History:  Procedure Laterality Date  . CESAREAN SECTION N/A 08/25/2015   Procedure: CESAREAN SECTION;  Surgeon: [name], MD;  Location: [facility];  Service: Obstetrics;  Laterality: N/A;  . CHOLECYSTECTOMY  2007       reports that  has never smoked. she has never used smokeless tobacco. She reports that she does not drink alcohol or use drugs.  Allergies  Allergen Reactions  . Ceftin [Cefuroxime Axetil] Rash    Family History  Problem Relation Age of Onset  . Sickle cell anemia Sister   . Diabetes Paternal Grandmother   . Sickle cell trait Mother   . Sickle cell trait Father     Prior to Admission medications   Medication Sig Start Date End Date Taking? Authorizing Provider  folic acid (FOLVITE) 1 MG tablet Take 2 tablets (2 mg total) by mouth daily. Patient taking differently: Take 1 mg by mouth daily.  06/16/15  Yes [name], MD  HYDROmorphone (DILAUDID) 2 MG tablet Take 1 tablet (2 mg total) by mouth every 4 (four) hours as needed for severe pain. 08/27/15  Yes Woodroe Mode, MD  hydroxyurea (HYDREA) 500 MG capsule Take 1 capsule (500 mg total) by mouth daily. May take with food to minimize GI side effects. Patient taking differently: Take 1,000 mg by mouth daily. May take with food to minimize GI side effects. 08/29/15  Yes Seabron Spates, CNM    Physical Exam: Vitals:   07/31/17 1700 07/31/17 1720 07/31/17 1813 07/31/17 1846  BP: 103/68 103/68 118/67 (!) 101/58  Pulse: 83 100 90 87  Resp:  12 13 14   Temp:      TempSrc:      SpO2: 95% 100% 97% 93%    Constitutional: NAD,  calm, comfortable Vitals:   07/31/17 1700 07/31/17 1720 07/31/17 1813 07/31/17 1846  BP: 103/68 103/68 118/67 (!) 101/58  Pulse: 83 100 90 87  Resp:  12 13 14   Temp:      TempSrc:      SpO2: 95% 100% 97% 93%   Eyes: PERRL, lids and conjunctivae normal ENMT: Mucous membranes are moist. Posterior pharynx clear of any exudate or lesions.Normal dentition.  Neck: normal, supple, no masses Respiratory: clear to auscultation bilaterally, no wheezing, no crackles. Normal respiratory effort. No accessory muscle use.  Cardiovascular: Regular rate and rhythm, 2/6 systolic murmur at apex. No extremity  edema. 2+ pedal pulses. No carotid bruits.  Abdomen: no tenderness, no masses palpated. No hepatosplenomegaly. Bowel sounds positive.  Musculoskeletal: no clubbing / cyanosis. No joint deformity upper and lower extremities. Good ROM, no contractures. Normal muscle tone. TTP in hip, legs and shoulder. Mild TTP of chest wall.  Skin: no rashes, lesions, ulcers. No induration Neurologic: CN 2-12 grossly intact. Sensation intact, DTR normal. Strength 5/5 in all 4.  Psychiatric: Normal judgment and insight. Alert and oriented x 3. Normal mood.   Labs on Admission: I have personally reviewed following labs and imaging studies  CBC: Recent Labs  Lab 07/31/17 1410  WBC 8.1  NEUTROABS 4.0  HGB 9.2*  HCT 25.4*  MCV 102.8*  PLT 735*   Basic Metabolic Panel: Recent Labs  Lab 07/31/17 1410  NA 138  K 4.3  CL 108  CO2 24  GLUCOSE 94  BUN 6  CREATININE <0.30*  CALCIUM 9.0   GFR: CrCl cannot be calculated (This lab value cannot be used to calculate CrCl because it is not a number: <0.30). Liver Function Tests: Recent Labs  Lab 07/31/17 1410  AST 58*  ALT 15  ALKPHOS 68  BILITOT 4.3*  PROT 8.0  ALBUMIN 4.4   No results for input(s): LIPASE, AMYLASE in the last 168 hours. No results for input(s): AMMONIA in the last 168 hours. Coagulation Profile: No results for input(s): INR, PROTIME in the last 168 hours. Cardiac Enzymes: No results for input(s): CKTOTAL, CKMB, CKMBINDEX, TROPONINI in the last 168 hours. BNP (last 3 results) No results for input(s): PROBNP in the last 8760 hours. HbA1C: No results for input(s): HGBA1C in the last 72 hours. CBG: No results for input(s): GLUCAP in the last 168 hours. Lipid Profile: No results for input(s): CHOL, HDL, LDLCALC, TRIG, CHOLHDL, LDLDIRECT in the last 72 hours. Thyroid Function Tests: No results for input(s): TSH, T4TOTAL, FREET4, T3FREE, THYROIDAB in the last 72 hours. Anemia Panel: Recent Labs    07/31/17 1410  RETICCTPCT  14.9*   Urine analysis:    Component Value Date/Time   COLORURINE YELLOW 07/17/2017 1800   APPEARANCEUR HAZY (A) 07/17/2017 1800   LABSPEC 1.008 07/17/2017 1800   PHURINE 5.0 07/17/2017 1800   GLUCOSEU NEGATIVE 07/17/2017 1800   HGBUR SMALL (A) 07/17/2017 1800   BILIRUBINUR NEGATIVE 07/17/2017 1800   KETONESUR NEGATIVE 07/17/2017 1800   PROTEINUR NEGATIVE 07/17/2017 1800   UROBILINOGEN 2.0 (H) 08/06/2015 1051   NITRITE NEGATIVE 07/17/2017 1800   LEUKOCYTESUR NEGATIVE 07/17/2017 1800    Radiological Exams on Admission: No results found.  Assessment/Plan Active Problems:   Sickle cell pain crisis (HCC)  Sickle cell pain crisis - PCA per sickle cell protocol - D5-1/2NS + 20 mEq KCl at 100 cc/h - Supplemental oxygen encouraged - Obtain EKG, CXR - Anti-emetics PRN - Benadryl PO for itching - Continue home Hydrea, folate -  Ensure urine pregnancy collected - Daily CBC with diff, CMP, LDH  DVT prophylaxis: Lovenox Code Status: Full Disposition Plan: Home in 1-2 days Consults called: None Admission status: Inpatient   Bennie Pierini MD [facility]  If 7PM-7AM, please contact night-coverage [email or web address] Password [facility]  07/31/2017, 7:13 PM

## 2017-07-31 NOTE — ED Notes (Signed)
Pt aware of need for urine specimen. 

## 2017-07-31 NOTE — ED Notes (Signed)
ED TO INPATIENT HANDOFF REPORT  Name/Age/Gender Gabriella Griffin 26 y.o. female  Code Status    Code Status Orders  (From admission, onward)        Start     Ordered   07/31/17 1920  Full code  Continuous     07/31/17 1919    Code Status History    Date Active Date Inactive Code Status Order ID Comments User Context   07/17/2017 07:42 07/20/2017 18:51 Full Code [id number]  [name] Inpatient   07/09/2017 23:21 07/12/2017 19:56 Full Code [id number]  [name], MD ED   06/25/2017 03:08 06/29/2017 20:21 Full Code [id number]  [name], MD ED   05/08/2017 22:14 05/14/2017 21:30 Full Code [id number]  [name], MD ED   03/26/2016 16:52 03/30/2016 20:06 Full Code [id number]  [name], MD ED   08/25/2015 03:39 08/29/2015 15:28 Full Code [id number]  [name], MD Inpatient   08/21/2015 19:40 08/25/2015 03:38 Full Code [id number]  [name], [name] Inpatient   08/14/2015 13:32 08/21/2015 19:40 Full Code [id number]  [name], MD Inpatient   07/13/2015 18:34 07/18/2015 20:39 Full Code [id number]  [name], MD Inpatient   06/11/2015 12:10 06/16/2015 16:00 Full Code [id number]  [name], MD Inpatient   02/07/2015 02:51 02/09/2015 14:48 Full Code [id number]  [name], MD Inpatient      Home/SNF/Other Home  Chief Complaint dizziness  Level of Care/Admitting Diagnosis ED Disposition    ED Disposition Condition [place]: [facility] [[id number]]  Level of Care: Med-Surg [16]  Diagnosis: Sickle cell pain crisis [facility]) [[id number]]  Admitting Physician: Bennie Pierini [[id number]]  Attending Physician: Jonnie Finner, [place] [[id number]]  Estimated length of stay: past midnight tomorrow  Certification:: I certify this patient will need inpatient services for at least 2 midnights  PT Class (Do Not Modify): Inpatient [101]  PT Acc Code (Do Not Modify): Private [1]       Medical  History Past Medical History:  Diagnosis Date  . Sickle cell anemia (HCC)   . Sickle cell disease (HCC)     Allergies Allergies  Allergen Reactions  . Ceftin [Cefuroxime Axetil] Rash    IV Location/Drains/Wounds Patient Lines/Drains/Airways Status   Active Line/Drains/Airways    Name:   Placement date:   Placement time:   Site:   Days:   Peripheral IV 07/31/17 Left;Upper Arm   07/31/17    1409    Arm   less than 1          Labs/Imaging Results for orders placed or performed during the hospital encounter of 07/31/17 (from the past 48 hour(s))  Comprehensive metabolic panel     Status: Abnormal   Collection Time: 07/31/17  2:10 PM  Result Value Ref Range   Sodium 138 135 - 145 mmol/L   Potassium 4.3 3.5 - 5.1 mmol/L   Chloride 108 101 - 111 mmol/L   CO2 24 22 - 32 mmol/L   Glucose, Bld 94 65 - 99 mg/dL   BUN 6 6 - 20 mg/dL   Creatinine, Ser <0.30 (L) 0.44 - 1.00 mg/dL   Calcium 9.0 8.9 - 10.3 mg/dL   Total Protein 8.0 6.5 - 8.1 g/dL   Albumin 4.4 3.5 - 5.0 g/dL   AST 58 (H) 15 - 41 U/L   ALT 15 14 - 54 U/L   Alkaline Phosphatase 68 38 - 126 U/L   Total Bilirubin 4.3 (  H) 0.3 - 1.2 mg/dL   GFR calc non Af Amer NOT CALCULATED >60 mL/min   GFR calc Af Amer NOT CALCULATED >60 mL/min    Comment: (NOTE) The eGFR has been calculated using the CKD EPI equation. This calculation has not been validated in all clinical situations. eGFR's persistently <60 mL/min signify possible Chronic Kidney Disease.    Anion gap 6 5 - 15  CBC with Differential     Status: Abnormal   Collection Time: 07/31/17  2:10 PM  Result Value Ref Range   WBC 8.1 4.0 - 10.5 K/uL    Comment: ADJUSTED FOR NUCLEATED RBC'S   RBC 2.47 (L) 3.87 - 5.11 MIL/uL   Hemoglobin 9.2 (L) 12.0 - 15.0 g/dL   HCT 25.4 (L) 36.0 - 46.0 %   MCV 102.8 (H) 78.0 - 100.0 fL   MCH 37.2 (H) 26.0 - 34.0 pg   MCHC 36.2 (H) 30.0 - 36.0 g/dL   RDW 20.7 (H) 11.5 - 15.5 %   Platelets 441 (H) 150 - 400 K/uL    Comment: SPECIMEN  CHECKED FOR CLOTS REPEATED TO VERIFY    Neutrophils Relative % 50 %   Lymphocytes Relative 34 %   Monocytes Relative 14 %   Eosinophils Relative 1 %   Basophils Relative 1 %   nRBC 3 (H) 0 /100 WBC   Neutro Abs 4.0 1.7 - 7.7 K/uL   Lymphs Abs 2.8 0.7 - 4.0 K/uL   Monocytes Absolute 1.1 (H) 0.1 - 1.0 K/uL   Eosinophils Absolute 0.1 0.0 - 0.7 K/uL   Basophils Absolute 0.1 0.0 - 0.1 K/uL   RBC Morphology Sickle cells present     Comment: TARGET CELLS RARE NRBCs POLYCHROMASIA PRESENT   Reticulocytes     Status: Abnormal   Collection Time: 07/31/17  2:10 PM  Result Value Ref Range   Retic Ct Pct 14.9 (H) 0.4 - 3.1 %   RBC. 2.47 (L) 3.87 - 5.11 MIL/uL   Retic Count, Absolute 368.0 (H) 19.0 - 186.0 K/uL  Lactate dehydrogenase     Status: Abnormal   Collection Time: 07/31/17  2:10 PM  Result Value Ref Range   LDH 589 (H) 98 - 192 U/L  I-Stat beta hCG blood, ED     Status: None   Collection Time: 07/31/17  2:14 PM  Result Value Ref Range   I-stat hCG, quantitative <5.0 <5 mIU/mL   Comment 3            Comment:   GEST. AGE      CONC.  (mIU/mL)   <=1 WEEK        5 - 50     2 WEEKS       50 - 500     3 WEEKS       100 - 10,000     4 WEEKS     1,000 - 30,000        FEMALE AND NON-PREGNANT FEMALE:     LESS THAN 5 mIU/mL    No results found.  Pending Labs Unresulted Labs (From admission, onward)   Start     Ordered   08/01/17 0500  CBC with Differential/Platelet  Daily,   R     07/31/17 1919   08/01/17 0500  Comprehensive metabolic panel  Daily,   R     07/31/17 1919   08/01/17 0500  Lactate dehydrogenase  Daily,   R     07/31/17 1919   07/31/17 1922  Pregnancy, urine  Once,   R     07/31/17 1921   07/31/17 1920  Phosphorus  Add-on,   R     07/31/17 1919   07/31/17 1920  Magnesium  Add-on,   R     07/31/17 1919      Vitals/Pain Today's Vitals   07/31/17 1845 07/31/17 1846 07/31/17 1900 07/31/17 1915  BP:  (!) 101/58 109/62   Pulse: 90 87 81 91  Resp:  14    Temp:       TempSrc:      SpO2: 93% 93% 93% (!) 85%  PainSc:        Isolation Precautions No active isolations  Medications Medications  diphenhydrAMINE (BENADRYL) capsule 25-50 mg (25 mg Oral Given 43/56/86 1683)  folic acid (FOLVITE) tablet 1 mg (not administered)  hydroxyurea (HYDREA) capsule 1,000 mg (not administered)  senna-docusate (Senokot-S) tablet 1 tablet (not administered)  polyethylene glycol (MIRALAX / GLYCOLAX) packet 17 g (not administered)  enoxaparin (LOVENOX) injection 40 mg (not administered)  ketorolac (TORADOL) 15 MG/ML injection 15 mg (not administered)  diphenhydrAMINE (BENADRYL) capsule 25-50 mg (not administered)  ondansetron (ZOFRAN) tablet 4 mg (not administered)    Or  ondansetron (ZOFRAN) injection 4 mg (not administered)  naloxone (NARCAN) injection 0.4 mg (not administered)    And  sodium chloride flush (NS) 0.9 % injection 9 mL (not administered)  ondansetron (ZOFRAN) injection 4 mg (not administered)  HYDROmorphone (DILAUDID) 1 mg/mL PCA injection (not administered)  dextrose 5 % and 0.45 % NaCl with KCl 20 mEq/L infusion (not administered)  ketorolac (TORADOL) 30 MG/ML injection 30 mg (30 mg Intravenous Given 07/31/17 1426)  HYDROmorphone (DILAUDID) injection 1 mg (1 mg Intravenous Given 07/31/17 1459)    Or  HYDROmorphone (DILAUDID) injection 1 mg ( Subcutaneous See Alternative 07/31/17 1459)  HYDROmorphone (DILAUDID) injection 1 mg (1 mg Intravenous Given 07/31/17 1539)    Or  HYDROmorphone (DILAUDID) injection 1 mg ( Subcutaneous See Alternative 07/31/17 1539)  HYDROmorphone (DILAUDID) injection 2 mg (1 mg Intravenous Given 07/31/17 1423)    Or  HYDROmorphone (DILAUDID) injection 2 mg ( Subcutaneous See Alternative 07/31/17 1423)  HYDROmorphone (DILAUDID) injection 2 mg (2 mg Intravenous Given 07/31/17 1722)    Or  HYDROmorphone (DILAUDID) injection 2 mg ( Subcutaneous See Alternative 07/31/17 1722)  HYDROmorphone (DILAUDID) injection 2 mg (2 mg  Intravenous Given 07/31/17 1859)    Mobility walks

## 2017-08-01 ENCOUNTER — Other Ambulatory Visit: Payer: Self-pay

## 2017-08-01 DIAGNOSIS — D5700 Hb-SS disease with crisis, unspecified: Principal | ICD-10-CM

## 2017-08-01 DIAGNOSIS — R0902 Hypoxemia: Secondary | ICD-10-CM

## 2017-08-01 DIAGNOSIS — D638 Anemia in other chronic diseases classified elsewhere: Secondary | ICD-10-CM

## 2017-08-01 LAB — COMPREHENSIVE METABOLIC PANEL
ALBUMIN: 3.9 g/dL (ref 3.5–5.0)
ALK PHOS: 108 U/L (ref 38–126)
ALT: 160 U/L — ABNORMAL HIGH (ref 14–54)
ANION GAP: 5 (ref 5–15)
AST: 413 U/L — ABNORMAL HIGH (ref 15–41)
BUN: 7 mg/dL (ref 6–20)
CALCIUM: 8.6 mg/dL — AB (ref 8.9–10.3)
CHLORIDE: 108 mmol/L (ref 101–111)
CO2: 24 mmol/L (ref 22–32)
Creatinine, Ser: <0.3 mg/dL — ABNORMAL LOW (ref 0.44–1.00)
GLUCOSE: 103 mg/dL — AB (ref 65–99)
POTASSIUM: 4.3 mmol/L (ref 3.5–5.1)
SODIUM: 137 mmol/L (ref 135–145)
Total Bilirubin: 8.3 mg/dL — ABNORMAL HIGH (ref 0.3–1.2)
Total Protein: 7.1 g/dL (ref 6.5–8.1)

## 2017-08-01 LAB — CBC WITH DIFFERENTIAL/PLATELET
BASOS PCT: 1 %
Basophils Absolute: 0.1 10*3/uL (ref 0.0–0.1)
EOS ABS: 0.1 10*3/uL (ref 0.0–0.7)
EOS PCT: 1 %
HCT: 23.2 % — ABNORMAL LOW (ref 36.0–46.0)
HEMOGLOBIN: 8.4 g/dL — AB (ref 12.0–15.0)
Lymphocytes Relative: 43 %
Lymphs Abs: 4.5 10*3/uL — ABNORMAL HIGH (ref 0.7–4.0)
MCH: 37.3 pg — AB (ref 26.0–34.0)
MCHC: 36.2 g/dL — AB (ref 30.0–36.0)
MCV: 103.1 fL — ABNORMAL HIGH (ref 78.0–100.0)
MONOS PCT: 12 %
Monocytes Absolute: 1.2 10*3/uL — ABNORMAL HIGH (ref 0.1–1.0)
NEUTROS PCT: 43 %
Neutro Abs: 4.5 10*3/uL (ref 1.7–7.7)
PLATELETS: 369 10*3/uL (ref 150–400)
RBC: 2.25 MIL/uL — ABNORMAL LOW (ref 3.87–5.11)
RDW: 20.2 % — ABNORMAL HIGH (ref 11.5–15.5)
WBC: 10.3 10*3/uL (ref 4.0–10.5)

## 2017-08-01 LAB — LACTATE DEHYDROGENASE: LDH: 621 U/L — ABNORMAL HIGH (ref 98–192)

## 2017-08-01 LAB — RETICULOCYTES
RBC.: 2.26 MIL/uL — AB (ref 3.87–5.11)
RETIC COUNT ABSOLUTE: 332.2 10*3/uL — AB (ref 19.0–186.0)
RETIC CT PCT: 14.7 % — AB (ref 0.4–3.1)

## 2017-08-01 LAB — PREGNANCY, URINE: PREG TEST UR: NEGATIVE

## 2017-08-01 MED ORDER — HYDROMORPHONE 1 MG/ML IV SOLN
INTRAVENOUS | Status: DC
  Administered 2017-08-01: 2 mg via INTRAVENOUS
  Administered 2017-08-02: 1.5 mg via INTRAVENOUS
  Administered 2017-08-02: 1 mg via INTRAVENOUS
  Administered 2017-08-02: 2 mg via INTRAVENOUS
  Administered 2017-08-02: 3.5 mg via INTRAVENOUS
  Administered 2017-08-02: 2.7 mg via INTRAVENOUS
  Administered 2017-08-02: 17:00:00 via INTRAVENOUS
  Administered 2017-08-02: 2.5 mg via INTRAVENOUS
  Administered 2017-08-03: 2 mg via INTRAVENOUS
  Administered 2017-08-03: 3 mg via INTRAVENOUS
  Administered 2017-08-03: 3.5 mg via INTRAVENOUS
  Filled 2017-08-01: qty 25

## 2017-08-01 MED ORDER — ORAL CARE MOUTH RINSE
15.0000 mL | Freq: Two times a day (BID) | OROMUCOSAL | Status: DC
  Administered 2017-08-02 – 2017-08-03 (×3): 15 mL via OROMUCOSAL

## 2017-08-01 MED ORDER — HYDROMORPHONE HCL 2 MG/ML IJ SOLN
2.0000 mg | INTRAMUSCULAR | Status: DC
  Administered 2017-08-01 – 2017-08-02 (×4): 2 mg via INTRAVENOUS
  Filled 2017-08-01 (×4): qty 1

## 2017-08-02 DIAGNOSIS — R112 Nausea with vomiting, unspecified: Secondary | ICD-10-CM

## 2017-08-02 MED ORDER — ACETAMINOPHEN 325 MG PO TABS
650.0000 mg | ORAL_TABLET | Freq: Once | ORAL | Status: AC
Start: 2017-08-02 — End: 2017-08-02
  Administered 2017-08-02: 650 mg via ORAL
  Filled 2017-08-02: qty 2

## 2017-08-02 MED ORDER — HYDROMORPHONE HCL 4 MG PO TABS
2.0000 mg | ORAL_TABLET | ORAL | Status: DC | PRN
  Administered 2017-08-03 (×2): 2 mg via ORAL
  Filled 2017-08-02 (×2): qty 1

## 2017-08-02 NOTE — Progress Notes (Signed)
[place] PROGRESS NOTE  Gabriella Griffin OEV:[id number] DOB: March 20, 1991 DOA: 07/31/2017 PCP: Patient, No Pcp Per  Assessment/Plan: Active Problems:   Sickle cell pain crisis ([place])  1. Hb SS with Crisis: Will prescribe oral Dilaudid 2 mg every 4 hours as needed. Continue PCA at weaned dose and continue Toradol.  2. Emesis: Associated with opiate use. Managed well with Zofran.  3. Anemia of Chronic Disease: Hb at baseline.  4. Difficulty in obtaining medications: Pt has spoken wit her Hematologist who has directed her prescription to a Pharmacy here in [place] rather than in [place]. She states that she will be able to obtain her medications at the time of discharge.    Code Status: Full Code Family Communication: N/A Disposition Plan: Anticipate discharge tomorrow  Elease Swarm A.  Pager [phone]. If 7PM-7AM, please contact night-coverage.  08/02/2017, 4:13 PM  LOS: 2 days   Interim History: Pt reports that her pain is 5/10 today and localized to LLE and low back. She has had some emesis with the IV dilaudid with last emesis being earlier this morning. She ash used 12.2 mg of Dilaudid via the PCA with 25/25:demands/deliveries in the last 24 hours.   Consultants:  None  Procedures:  None  Antibiotics:  None    Objective: Vitals:   08/02/17 0828 08/02/17 1012 08/02/17 1211 08/02/17 1350  BP:  106/74  (!) 97/53  Pulse:  66  76  Resp: 15 11 19 19   Temp:  98.3 F (36.8 C)  98.9 F (37.2 C)  TempSrc:  Oral  Oral  SpO2: 100% 99% 98% 100%  Weight:      Height:       Weight change:   Intake/Output Summary (Last 24 hours) at 08/02/2017 1613 Last data filed at 08/01/2017 2100 Gross per 24 hour  Intake 240 ml  Output -  Net 240 ml      Physical Exam General: Alert, awake, oriented x3, in no acute distress.  OROPHARYNX:  Moist, No exudate/ erythema/lesions.  Heart: Regular rate and rhythm, without murmurs, rubs, gallops, PMI  non-displaced, no heaves or thrills on palpation.  Lungs: Clear to auscultation, no wheezing or rhonchi noted. No increased vocal fremitus resonant to percussion  Abdomen: Soft, nontender, nondistended, positive bowel sounds, no masses no hepatosplenomegaly noted.  Neuro: No focal neurological deficits noted cranial nerves II through XII grossly intact.  Strength at baseline in bilateral upper and lower extremities. Musculoskeletal: No warmth swelling or erythema around joints, no spinal tenderness noted. Psychiatric: Patient alert and oriented x3, good insight and cognition, good recent to remote recall.    Data Reviewed: Basic Metabolic Panel: Recent Labs  Lab 07/31/17 1410 08/01/17 0344  NA 138 137  K 4.3 4.3  CL 108 108  CO2 24 24  GLUCOSE 94 103*  BUN 6 7  CREATININE <0.30* <0.30*  CALCIUM 9.0 8.6*  MG 2.0  --   PHOS 3.5  --    Liver Function Tests: Recent Labs  Lab 07/31/17 1410 08/01/17 0344  AST 58* 413*  ALT 15 160*  ALKPHOS 68 108  BILITOT 4.3* 8.3*  PROT 8.0 7.1  ALBUMIN 4.4 3.9   No results for input(s): LIPASE, AMYLASE in the last 168 hours. No results for input(s): AMMONIA in the last 168 hours. CBC: Recent Labs  Lab 07/31/17 1410 08/01/17 0344  WBC 8.1 10.3  NEUTROABS 4.0 4.5  HGB 9.2* 8.4*  HCT 25.4* 23.2*  MCV 102.8* 103.1*  PLT 441* 369  Cardiac Enzymes: No results for input(s): CKTOTAL, CKMB, CKMBINDEX, TROPONINI in the last 168 hours. BNP (last 3 results) No results for input(s): BNP in the last 8760 hours.  ProBNP (last 3 results) No results for input(s): PROBNP in the last 8760 hours.  CBG: No results for input(s): GLUCAP in the last 168 hours.  No results found for this or any previous visit (from the past 240 hour(s)).   Studies: Dg Chest 2 View  Result Date: 07/31/2017 CLINICAL DATA:  Sickle cell crisis EXAM: CHEST  2 VIEW COMPARISON:  July 18, 2017 FINDINGS: Lungs are clear. Heart size and pulmonary vascularity are  normal. No adenopathy. No appreciable bone lesions. IMPRESSION: No edema or consolidation. Electronically Signed   By: Lowella Grip III M.D.   On: 07/31/2017 19:57   X-ray Chest Pa And Lateral  Result Date: 07/18/2017 CLINICAL DATA:  History of sickle cell disease.  Chest pain. EXAM: CHEST  2 VIEW COMPARISON:  PA and lateral chest 07/09/2017 03/26/2016. FINDINGS: The lungs are clear. Heart size is mildly enlarged. No pneumothorax or pleural effusion. No acute bony abnormality. IMPRESSION: No acute disease. Electronically Signed   By: Inge Rise M.D.   On: 07/18/2017 10:26   Dg Chest 2 View  Result Date: 07/09/2017 CLINICAL DATA:  26 year old female with central chest pain and shortness of breath since yesterday. History of sickle cell disease. EXAM: CHEST  2 VIEW COMPARISON:  06/24/2017 FINDINGS: Cardiomediastinal silhouette is borderline enlarged but stable. No focal parenchymal opacity, pleural effusion or pneumothorax. There is again mild retrocardiac atelectasis. No acute osseous abnormalities. IMPRESSION: Mild, stable cardiomegaly without active cardiopulmonary disease. Electronically Signed   By: Kristopher Oppenheim M.D.   On: 07/09/2017 19:34    Scheduled Meds: . enoxaparin (LOVENOX) injection  40 mg Subcutaneous Q24H  . folic acid  1 mg Oral Daily  . HYDROmorphone   Intravenous Q4H  . hydroxyurea  1,000 mg Oral Daily  . ketorolac  15 mg Intravenous Q6H  . mouth rinse  15 mL Mouth Rinse BID  . senna-docusate  1 tablet Oral BID   Continuous Infusions: . dextrose 5 % and 0.45 % NaCl with KCl 20 mEq/L 100 mL/hr at 08/01/17 1818    Active Problems:   Sickle cell pain crisis (HCC)    In excess of 25 minutes spent during this visit. Greater than 50% involved face to face contact with the patient for assessment, counseling and coordination of care.

## 2017-08-02 NOTE — Progress Notes (Signed)
Pt c/o of a headache ,frontal area . Rates pain a 5/10. I sent a page to Dr Zigmund Daniel. Pt stated that she thinks the dilaudid  Is causing the pain

## 2017-08-02 NOTE — Progress Notes (Signed)
[place] PROGRESS NOTE  Shery [place] ALP:[id number] DOB: 07/18/1991 DOA: 07/31/2017 PCP: Patient, No Pcp Per  Assessment/Plan: Active Problems:   Sickle cell pain crisis ([place])  1. Hb SS with HPFH with Crisis: Continue PCA and schedule Dilaudid. Continue Toradol and IVF. 2. Anemia of Chronic Disease: Hb stable. No indication for transfusion.  3. Mild Hypoxia: Associate with opiate use. Will recheck as opiates are weaned.  Code Status: Full Code Family Communication: N/A Disposition Plan: Not yet ready for discharge  [place].  Pager [phone]. If 7PM-7AM, please contact night-coverage.  08/02/2017, 4:11 PM  LOS: 2 days   Interim History: Pt reports that pain is fluctuating between 8-10/10 and localized to back and lega. Pt states that she is sleeping a lot due to the pain. She was unable to obtain her medications after being discharged from the hospital on her last admission as she had no transportation to [place] the [place]. I have asked patient to call her Hematologist and have her prescriptions sent to a Pharmacy in [place].   Consultants:  None  Procedures:  None  Antibiotics:  None   Physical Exam Objective: General: Alert, awake, oriented x3, in moderate distress.  Vital Signs: Reviewed and stable HEENT: [place]/AT PEERL, EOMI, anicteric Neck: Trachea midline,  no masses, no thyromegal,y no JVD, no carotid bruit OROPHARYNX:  Moist, No exudate/ erythema/lesions.  Heart: Regular rate and rhythm, without murmurs, rubs, gallops, PMI non-displaced, no heaves or thrills on palpation.  Lungs: Clear to auscultation, no wheezing or rhonchi noted. No increased vocal fremitus resonant to percussion  Abdomen: Soft, nontender, nondistended, positive bowel sounds, no masses no hepatosplenomegaly noted..  Neuro: No focal neurological deficits noted cranial nerves II through XII grossly intact.  Strength at baseline in bilateral upper and lower  extremities. Musculoskeletal: No warmth swelling or erythema around joints, no spinal tenderness noted. Psychiatric: Patient alert and oriented x3, good insight and cognition, good recent to remote recall.     Data Reviewed: Basic Metabolic Panel: Recent Labs  Lab 07/31/17 1410 08/01/17 0344  NA 138 137  K 4.3 4.3  CL 108 108  CO2 24 24  GLUCOSE 94 103*  BUN 6 7  CREATININE <0.30* <0.30*  CALCIUM 9.0 8.6*  MG 2.0  --   PHOS 3.5  --    Liver Function Tests: Recent Labs  Lab 07/31/17 1410 08/01/17 0344  AST 58* 413*  ALT 15 160*  ALKPHOS 68 108  BILITOT 4.3* 8.3*  PROT 8.0 7.1  ALBUMIN 4.4 3.9   No results for input(s): LIPASE, AMYLASE in the last 168 hours. No results for input(s): AMMONIA in the last 168 hours. CBC: Recent Labs  Lab 07/31/17 1410 08/01/17 0344  WBC 8.1 10.3  NEUTROABS 4.0 4.5  HGB 9.2* 8.4*  HCT 25.4* 23.2*  MCV 102.8* 103.1*  PLT 441* 369   Cardiac Enzymes: No results for input(s): CKTOTAL, CKMB, CKMBINDEX, TROPONINI in the last 168 hours. BNP (last 3 results) No results for input(s): BNP in the last 8760 hours.  ProBNP (last 3 results) No results for input(s): PROBNP in the last 8760 hours.  CBG: No results for input(s): GLUCAP in the last 168 hours.  No results found for this or any previous visit (from the past 240 hour(s)).   Studies: Dg Chest 2 View  Result Date: 07/31/2017 CLINICAL DATA:  Sickle cell crisis EXAM: CHEST  2 VIEW COMPARISON:  July 18, 2017 FINDINGS: Lungs are clear. Heart size and pulmonary vascularity are  normal. No adenopathy. No appreciable bone lesions. IMPRESSION: No edema or consolidation. Electronically Signed   By: Lowella Grip III M.D.   On: 07/31/2017 19:57   X-ray Chest Pa And Lateral  Result Date: 07/18/2017 CLINICAL DATA:  History of sickle cell disease.  Chest pain. EXAM: CHEST  2 VIEW COMPARISON:  PA and lateral chest 07/09/2017 03/26/2016. FINDINGS: The lungs are clear. Heart size is  mildly enlarged. No pneumothorax or pleural effusion. No acute bony abnormality. IMPRESSION: No acute disease. Electronically Signed   By: Inge Rise M.D.   On: 07/18/2017 10:26   Dg Chest 2 View  Result Date: 07/09/2017 CLINICAL DATA:  26 year old female with central chest pain and shortness of breath since yesterday. History of sickle cell disease. EXAM: CHEST  2 VIEW COMPARISON:  06/24/2017 FINDINGS: Cardiomediastinal silhouette is borderline enlarged but stable. No focal parenchymal opacity, pleural effusion or pneumothorax. There is again mild retrocardiac atelectasis. No acute osseous abnormalities. IMPRESSION: Mild, stable cardiomegaly without active cardiopulmonary disease. Electronically Signed   By: Kristopher Oppenheim M.D.   On: 07/09/2017 19:34    Scheduled Meds: . enoxaparin (LOVENOX) injection  40 mg Subcutaneous Q24H  . folic acid  1 mg Oral Daily  . HYDROmorphone   Intravenous Q4H  . hydroxyurea  1,000 mg Oral Daily  . ketorolac  15 mg Intravenous Q6H  . mouth rinse  15 mL Mouth Rinse BID  . senna-docusate  1 tablet Oral BID   Continuous Infusions: . dextrose 5 % and 0.45 % NaCl with KCl 20 mEq/L 100 mL/hr at 08/01/17 1818    Active Problems:   Sickle cell pain crisis (HCC)     In excess of 25 minutes spent during this visit. Greater than 50% involved face to face contact with the patient for assessment, counseling and coordination of care.

## 2017-08-02 NOTE — Progress Notes (Signed)
1815 BP 89/46. p 80 Resp 11. Dr Ivan Croft notified. Pt walked around the unit with assistance. BP 109/55

## 2017-08-02 NOTE — Progress Notes (Signed)
When I entered PT room to give her the tylenol for her headache she stated " I   feel awful. I think  Its too  much medication" I think its the extra dilaudid.Writer will hold then 1200 noon 2 mg dose

## 2017-08-03 DIAGNOSIS — K5900 Constipation, unspecified: Secondary | ICD-10-CM

## 2017-08-03 DIAGNOSIS — D62 Acute posthemorrhagic anemia: Secondary | ICD-10-CM

## 2017-08-03 MED ORDER — LACTULOSE 10 GM/15ML PO SOLN
30.0000 g | Freq: Once | ORAL | Status: DC
  Filled 2017-08-03: qty 45
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
## 2017-08-03 NOTE — Progress Notes (Signed)
Patient ID: Gabriella Griffin, female   DOB: Oct 23, 1990, 26 y.o.   MRN: [id number] Pt informed me today that she has been menstruating for the last 4 days and that she is almost completed menses. This was not reported to this Physician before today.   MATTHEWS,MICHELLE A.

## 2017-08-03 NOTE — Progress Notes (Signed)
CM consult for medication assistance. Per MD note:  Pt has spoken wit her Hematologist who has directed her prescription to a Pharmacy here in [place] rather than in [place]. She states that she will be able to obtain her medications at the time of discharge.  Marney Doctor RN,BSN,NCM [phone]

## 2017-10-01 ENCOUNTER — Emergency Department (HOSPITAL_COMMUNITY): Payer: Self-pay

## 2017-10-01 ENCOUNTER — Encounter (HOSPITAL_COMMUNITY): Payer: Self-pay | Admitting: Emergency Medicine

## 2017-10-01 ENCOUNTER — Inpatient Hospital Stay (HOSPITAL_COMMUNITY)
Admission: EM | Admit: 2017-10-01 | Discharge: 2017-10-11 | DRG: 812 | Disposition: A | Discharge status: Home / Self Care | Payer: Self-pay | Attending: Internal Medicine | Admitting: Internal Medicine

## 2017-10-01 DIAGNOSIS — E876 Hypokalemia: Secondary | ICD-10-CM | POA: Diagnosis not present

## 2017-10-01 DIAGNOSIS — R1115 Cyclical vomiting syndrome unrelated to migraine: Secondary | ICD-10-CM

## 2017-10-01 DIAGNOSIS — D72829 Elevated white blood cell count, unspecified: Secondary | ICD-10-CM | POA: Diagnosis present

## 2017-10-01 DIAGNOSIS — Z915 Personal history of self-harm: Secondary | ICD-10-CM

## 2017-10-01 DIAGNOSIS — E875 Hyperkalemia: Secondary | ICD-10-CM | POA: Diagnosis present

## 2017-10-01 DIAGNOSIS — R51 Headache: Secondary | ICD-10-CM | POA: Diagnosis not present

## 2017-10-01 DIAGNOSIS — Z62810 Personal history of physical and sexual abuse in childhood: Secondary | ICD-10-CM | POA: Diagnosis present

## 2017-10-01 DIAGNOSIS — R112 Nausea with vomiting, unspecified: Secondary | ICD-10-CM | POA: Diagnosis present

## 2017-10-01 DIAGNOSIS — F5102 Adjustment insomnia: Secondary | ICD-10-CM | POA: Diagnosis present

## 2017-10-01 DIAGNOSIS — F419 Anxiety disorder, unspecified: Secondary | ICD-10-CM | POA: Diagnosis present

## 2017-10-01 DIAGNOSIS — D5700 Hb-SS disease with crisis, unspecified: Principal | ICD-10-CM | POA: Diagnosis present

## 2017-10-01 DIAGNOSIS — F332 Major depressive disorder, recurrent severe without psychotic features: Secondary | ICD-10-CM | POA: Diagnosis present

## 2017-10-01 DIAGNOSIS — R079 Chest pain, unspecified: Secondary | ICD-10-CM | POA: Diagnosis present

## 2017-10-01 DIAGNOSIS — Z833 Family history of diabetes mellitus: Secondary | ICD-10-CM

## 2017-10-01 DIAGNOSIS — K5900 Constipation, unspecified: Secondary | ICD-10-CM | POA: Diagnosis not present

## 2017-10-01 DIAGNOSIS — Z832 Family history of diseases of the blood and blood-forming organs and certain disorders involving the immune mechanism: Secondary | ICD-10-CM

## 2017-10-01 DIAGNOSIS — D638 Anemia in other chronic diseases classified elsewhere: Secondary | ICD-10-CM | POA: Diagnosis present

## 2017-10-01 LAB — CBC WITH DIFFERENTIAL/PLATELET
Basophils Absolute: 0.1 10*3/uL (ref 0.0–0.1)
Basophils Relative: 1 %
Eosinophils Absolute: 0.2 10*3/uL (ref 0.0–0.7)
Eosinophils Relative: 2 %
HCT: 23.3 % — ABNORMAL LOW (ref 36.0–46.0)
Hemoglobin: 8.3 g/dL — ABNORMAL LOW (ref 12.0–15.0)
Lymphocytes Relative: 42 %
Lymphs Abs: 4.1 10*3/uL — ABNORMAL HIGH (ref 0.7–4.0)
MCH: 36.2 pg — ABNORMAL HIGH (ref 26.0–34.0)
MCHC: 35.6 g/dL (ref 30.0–36.0)
MCV: 101.7 fL — ABNORMAL HIGH (ref 78.0–100.0)
Monocytes Absolute: 1.3 10*3/uL — ABNORMAL HIGH (ref 0.1–1.0)
Monocytes Relative: 13 %
Neutro Abs: 4.1 10*3/uL (ref 1.7–7.7)
Neutrophils Relative %: 42 %
Platelets: 381 10*3/uL (ref 150–400)
RBC: 2.29 MIL/uL — ABNORMAL LOW (ref 3.87–5.11)
RDW: 18 % — ABNORMAL HIGH (ref 11.5–15.5)
WBC: 9.8 10*3/uL (ref 4.0–10.5)
nRBC: 3 /100 WBC — ABNORMAL HIGH

## 2017-10-01 LAB — COMPREHENSIVE METABOLIC PANEL
ALT: 17 U/L (ref 14–54)
AST: 52 U/L — ABNORMAL HIGH (ref 15–41)
Albumin: 4.4 g/dL (ref 3.5–5.0)
Alkaline Phosphatase: 80 U/L (ref 38–126)
Anion gap: 8 (ref 5–15)
BUN: 5 mg/dL — ABNORMAL LOW (ref 6–20)
CO2: 26 mmol/L (ref 22–32)
Calcium: 8.9 mg/dL (ref 8.9–10.3)
Chloride: 108 mmol/L (ref 101–111)
Creatinine, Ser: <0.3 mg/dL — ABNORMAL LOW (ref 0.44–1.00)
Glucose, Bld: 93 mg/dL (ref 65–99)
Potassium: 3.8 mmol/L (ref 3.5–5.1)
Sodium: 142 mmol/L (ref 135–145)
Total Bilirubin: 5.1 mg/dL — ABNORMAL HIGH (ref 0.3–1.2)
Total Protein: 7.8 g/dL (ref 6.5–8.1)

## 2017-10-01 LAB — I-STAT BETA HCG BLOOD, ED (MC, WL, AP ONLY): I-stat hCG, quantitative: <5 m[IU]/mL (ref <5)

## 2017-10-01 MED ORDER — ONDANSETRON HCL 4 MG/2ML IJ SOLN
4.0000 mg | INTRAMUSCULAR | Status: DC | PRN
  Administered 2017-10-01 – 2017-10-02 (×2): 4 mg via INTRAVENOUS
  Filled 2017-10-01 (×2): qty 2

## 2017-10-01 MED ORDER — HYDROMORPHONE HCL 2 MG/ML IJ SOLN: 2.0000 mg | INTRAMUSCULAR | Status: AC

## 2017-10-01 MED ORDER — HYDROMORPHONE HCL 2 MG/ML IJ SOLN
2.0000 mg | INTRAMUSCULAR | Status: AC
  Administered 2017-10-01: 2 mg via INTRAVENOUS
  Filled 2017-10-01: qty 1

## 2017-10-01 MED ORDER — DIPHENHYDRAMINE HCL 50 MG/ML IJ SOLN
25.0000 mg | Freq: Once | INTRAMUSCULAR | Status: AC
  Administered 2017-10-01: 25 mg via INTRAVENOUS
  Filled 2017-10-01: qty 1

## 2017-10-01 MED ORDER — HYDROMORPHONE HCL 2 MG/ML IJ SOLN
2.0000 mg | INTRAMUSCULAR | Status: AC
  Administered 2017-10-02: 2 mg via INTRAVENOUS
  Filled 2017-10-01: qty 1

## 2017-10-01 MED ORDER — SODIUM CHLORIDE 0.45 % IV SOLN
INTRAVENOUS | Status: DC
  Administered 2017-10-01: 23:00:00 via INTRAVENOUS

## 2017-10-01 MED ORDER — KETOROLAC TROMETHAMINE 30 MG/ML IJ SOLN
30.0000 mg | INTRAMUSCULAR | Status: AC
  Administered 2017-10-01: 30 mg via INTRAVENOUS
  Filled 2017-10-01: qty 1

## 2017-10-01 NOTE — ED Provider Notes (Signed)
Time DEPT Provider Note   CSN: [id number] Arrival date & time: 10/01/17  1801     History   Chief Complaint Chief Complaint  Patient presents with  . Sickle Cell Pain Crisis    HPI Gabriella Griffin is a 27 y.o. female.  HPI  Gabriella Griffin is a 27 year old female with a history of sickle cell who presents to the emergency department for evaluation of sickle cell pain crisis.  Patient states that her pain is primarily located in the lower back and bilateral thighs.  It began 3 days ago and she was treating herself with 2 mg Dilaudid every 6 hours at home.  States that the Dilaudid helps, but wears off quickly.  Today while at work she had acute onset central 10/10 severity sharp chest pain which did not radiate.  Pain has been constant, although it has improved to about a 6/10 in severity at this time.  She denies associated shortness of breath or cough.  States that she has felt feverish the past couple days, denies chills or measured temperature at home.  Denies history of DVT/PE from her sickle cell, denies pleuritic chest pain, denies exogenous estrogen, recent surgery or immobilization.  Patient denies numbness, weakness, headache, abdominal pain, n/v, dysuria.   Past Medical History:  Diagnosis Date  . Sickle cell anemia (HCC)   . Sickle cell disease [facility])     Patient Active Problem List   Diagnosis Date Noted  . Anemia due to acute blood loss 07/31/2017  . Constipation 07/18/2017  . Anemia of chronic disease 07/17/2017  . 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 CESAREAN SECTION N/A 08/25/2015   Procedure: CESAREAN SECTION;  Surgeon: [name], MD;  Location: [facility];  Service: Obstetrics;  Laterality: N/A;  . CHOLECYSTECTOMY  2007    OB History    Gravida Para Term Preterm AB Living   2 1 1     1    SAB TAB  Ectopic Multiple Live Births         0 1       Home Medications    Prior to Admission medications   Medication Sig Start Date End Date Taking? Authorizing Provider  DM-Phenylephrine-Acetaminophen (TYLENOL COLD MAX) 10-5-325 MG/15ML LIQD Take 15 mLs by mouth 2 (two) times daily as needed (FOR COLD SYMPTOMS.).   Yes [provider]  folic acid (FOLVITE) 1 MG tablet Take 2 tablets (2 mg total) by mouth daily. Patient taking differently: Take 1 mg by mouth at bedtime.  06/16/15  Yes [name], MD  HYDROmorphone (DILAUDID) 2 MG tablet Take 1 tablet (2 mg total) by mouth every 4 (four) hours as needed for severe pain. Patient taking differently: Take 2 mg by mouth every 6 (six) hours as needed for severe pain.  08/27/15  Yes Woodroe Mode, MD  hydroxyurea (HYDREA) 500 MG capsule Take 1 capsule (500 mg total) by mouth daily. May take with food to minimize GI side effects. Patient taking differently: Take 1,000 mg by mouth at bedtime. May take with food to minimize GI side effects. 08/29/15  Yes Seabron Spates, CNM    Family History Family History  Problem Relation Age of Onset  . Sickle cell anemia Sister   . Diabetes Paternal Grandmother   . Sickle cell trait Mother   . Sickle cell trait Father  Social History Social History   Tobacco Use  . Smoking status: Never Smoker  . Smokeless tobacco: Never Used  Substance Use Topics  . Alcohol use: No  . Drug use: No     Allergies   Ceftin [cefuroxime axetil]   Review of Systems Review of Systems  Constitutional: Positive for fever (tactile). Negative for chills.  Eyes: Negative for visual disturbance.  Respiratory: Negative for cough and shortness of breath.   Cardiovascular: Positive for chest pain. Negative for leg swelling.  Gastrointestinal: Negative for abdominal pain, nausea and vomiting.  Genitourinary: Negative for dysuria and frequency.  Musculoskeletal: Positive for back pain and myalgias  (bilateral thigh pain). Negative for gait problem.  Skin: Negative for rash and wound.  Neurological: Negative for weakness and numbness.  Psychiatric/Behavioral: Negative for agitation.     Physical Exam Updated Vital Signs BP (!) 129/101 (BP Location: Left Arm)   Pulse 81   Temp 98.8 F (37.1 C) (Oral)   Resp 19   LMP 09/28/2017   SpO2 97%   Physical Exam  Constitutional: She is oriented to person, place, and time. She appears well-developed and well-nourished. No distress.  HENT:  Head: Normocephalic and atraumatic.  Mouth/Throat: Oropharynx is clear and moist. No oropharyngeal exudate.  Eyes: Conjunctivae are normal. Pupils are equal, round, and reactive to light. Right eye exhibits no discharge. Left eye exhibits no discharge. Scleral icterus is present.  Neck: Normal range of motion. Neck supple.  Cardiovascular: Normal rate, regular rhythm and intact distal pulses. Exam reveals no friction rub.  No murmur heard. Pulmonary/Chest: Effort normal and breath sounds normal. No stridor. No respiratory distress. She has no wheezes. She has no rales.  Chest tender to palpation.   Abdominal: Soft. Bowel sounds are normal. There is no tenderness. There is no guarding.  Musculoskeletal:  No leg swelling or calf tenderness.   Neurological: She is alert and oriented to person, place, and time. Coordination normal.  Skin: Skin is warm and dry. Capillary refill takes less than 2 seconds. She is not diaphoretic.  Psychiatric: She has a normal mood and affect. Her behavior is normal.  Nursing note and vitals reviewed.    ED Treatments / Results  Labs (all labs ordered are listed, but only abnormal results are displayed) Labs Reviewed  COMPREHENSIVE METABOLIC PANEL - Abnormal; Notable for the following components:      Result Value   BUN 5 (*)    Creatinine, Ser <0.30 (*)    AST 52 (*)    Total Bilirubin 5.1 (*)    All other components within normal limits  CBC WITH  DIFFERENTIAL/PLATELET - Abnormal; Notable for the following components:   RBC 2.29 (*)    Hemoglobin 8.3 (*)    HCT 23.3 (*)    MCV 101.7 (*)    MCH 36.2 (*)    RDW 18.0 (*)    nRBC 3 (*)    Lymphs Abs 4.1 (*)    Monocytes Absolute 1.3 (*)    All other components within normal limits  RETICULOCYTES - Abnormal; Notable for the following components:   Retic Ct Pct >23.0 (*)    RBC. 2.29 (*)    All other components within normal limits  I-STAT BETA HCG BLOOD, ED ([facility], [facility], [facility] ONLY)    EKG  EKG Interpretation None       Radiology Dg Chest 2 View  Result Date: 10/01/2017 CLINICAL DATA:  Chest pain, history of sickle cell EXAM: CHEST  2 VIEW  COMPARISON:  07/31/2017 FINDINGS: Mild cardiomegaly without aortic atherosclerosis. No acute pneumonic consolidation, CHF, effusion or pneumothorax. No acute osseous abnormality. IMPRESSION: Stable mild cardiomegaly.  No active pulmonary disease. Electronically Signed   By: Ashley Royalty M.D.   On: 10/01/2017 20:59    Procedures Procedures (including critical care time)  Medications Ordered in ED Medications  0.45 % sodium chloride infusion ( Intravenous New Bag/Given 10/01/17 2236)  HYDROmorphone (DILAUDID) injection 2 mg (not administered)    Or  HYDROmorphone (DILAUDID) injection 2 mg (not administered)  HYDROmorphone (DILAUDID) injection 2 mg (not administered)    Or  HYDROmorphone (DILAUDID) injection 2 mg (not administered)  ondansetron (ZOFRAN) injection 4 mg (4 mg Intravenous Given 10/01/17 2239)  ketorolac (TORADOL) 30 MG/ML injection 30 mg (30 mg Intravenous Given 10/01/17 2233)  HYDROmorphone (DILAUDID) injection 2 mg (2 mg Intravenous Given 10/01/17 2232)    Or  HYDROmorphone (DILAUDID) injection 2 mg ( Subcutaneous See Alternative 10/01/17 2232)  HYDROmorphone (DILAUDID) injection 2 mg (2 mg Intravenous Given 10/01/17 2304)    Or  HYDROmorphone (DILAUDID) injection 2 mg ( Subcutaneous See Alternative 10/01/17 2304)  diphenhydrAMINE  (BENADRYL) injection 25 mg (25 mg Intravenous Given 10/01/17 2233)     Initial Impression / Assessment and Plan / ED Course  I have reviewed the triage vital signs and the nursing notes.  Pertinent labs & imaging results that were available during my care of the patient were reviewed by me and considered in my medical decision making (see chart for details).  Clinical Course as of Oct 02 957  Sun Oct 02, 2017  0100 Patient states that her pain is 4/10. Will give last dose and reevaluate.  [AH]    Clinical Course User Index [AH] Margarita Mail, PA-C   Patient presents with sickle cell pain crisis. Endorses central chest pain beginning today and tactile fever the past few days.  Results reviewed. CMP unremarkable, bilirubin 5.1 which appears to be baseline. CBC reveals Hgb 8.3 which appears to be patients baseline as well. No leukocytosis. Reticulocytes >23. BhCG negative.   CXR without acute infiltrate. Patient is afebrile and denies cough. VSS. Given these findings, do not suspect acute chest syndrome. She does not have a history of DVT/PE, no tachypnea or SOB, no pleuritic chest pain, no leg swelling or calf tenderness, no recent immobility or surgery. Do not suspect PE given presentation.  Patient was a difficult IV stick and requested IV team after several failed nursing attempts. Sickle cell protocol begun for opioid tolerant. Patient continues to be in pain on recheck.   Sign out at end of shift given to night PA Margarita Mail for disposition. Plan to admit patient if her pain is not under control.   Final Clinical Impressions(s) / ED Diagnoses   Final diagnoses:  Sickle cell pain crisis [facility])    ED Discharge Orders    None       Bernarda Caffey 10/02/17 1001    Virgel Manifold, MD 10/02/17 1954

## 2017-10-01 NOTE — ED Triage Notes (Signed)
Patient here via EMS with complaints of sickle cell pain crisis. Reports taking her prescribed dilaudid with no relief. Pain 10/10.

## 2017-10-02 ENCOUNTER — Other Ambulatory Visit: Payer: Self-pay

## 2017-10-02 DIAGNOSIS — D5700 Hb-SS disease with crisis, unspecified: Secondary | ICD-10-CM | POA: Diagnosis present

## 2017-10-02 DIAGNOSIS — R079 Chest pain, unspecified: Secondary | ICD-10-CM

## 2017-10-02 LAB — RESPIRATORY PANEL BY PCR
Adenovirus: NOT DETECTED
BORDETELLA PERTUSSIS-RVPCR: NOT DETECTED
CORONAVIRUS 229E-RVPPCR: NOT DETECTED
CORONAVIRUS HKU1-RVPPCR: NOT DETECTED
CORONAVIRUS OC43-RVPPCR: NOT DETECTED
Chlamydophila pneumoniae: NOT DETECTED
Coronavirus NL63: NOT DETECTED
Influenza A: NOT DETECTED
Influenza B: NOT DETECTED
METAPNEUMOVIRUS-RVPPCR: NOT DETECTED
Mycoplasma pneumoniae: NOT DETECTED
PARAINFLUENZA VIRUS 1-RVPPCR: NOT DETECTED
PARAINFLUENZA VIRUS 2-RVPPCR: NOT DETECTED
Parainfluenza Virus 3: NOT DETECTED
Parainfluenza Virus 4: NOT DETECTED
Respiratory Syncytial Virus: NOT DETECTED
Rhinovirus / Enterovirus: NOT DETECTED

## 2017-10-02 LAB — CBC WITH DIFFERENTIAL/PLATELET
BASOS ABS: 0 10*3/uL (ref 0.0–0.1)
Basophils Relative: 0 %
EOS ABS: 0.2 10*3/uL (ref 0.0–0.7)
Eosinophils Relative: 2 %
HCT: 21.2 % — ABNORMAL LOW (ref 36.0–46.0)
HEMOGLOBIN: 7.7 g/dL — AB (ref 12.0–15.0)
LYMPHS PCT: 54 %
Lymphs Abs: 5.6 10*3/uL — ABNORMAL HIGH (ref 0.7–4.0)
MCH: 36.5 pg — ABNORMAL HIGH (ref 26.0–34.0)
MCHC: 36.3 g/dL — ABNORMAL HIGH (ref 30.0–36.0)
MCV: 100.5 fL — ABNORMAL HIGH (ref 78.0–100.0)
Monocytes Absolute: 0.9 10*3/uL (ref 0.1–1.0)
Monocytes Relative: 9 %
NEUTROS PCT: 35 %
Neutro Abs: 3.6 10*3/uL (ref 1.7–7.7)
Platelets: ADEQUATE 10*3/uL (ref 150–400)
RBC: 2.11 MIL/uL — ABNORMAL LOW (ref 3.87–5.11)
RDW: 17.8 % — ABNORMAL HIGH (ref 11.5–15.5)
WBC: 10.3 10*3/uL (ref 4.0–10.5)

## 2017-10-02 LAB — BASIC METABOLIC PANEL
ANION GAP: 6 (ref 5–15)
BUN: 6 mg/dL (ref 6–20)
CHLORIDE: 106 mmol/L (ref 101–111)
CO2: 24 mmol/L (ref 22–32)
Calcium: 8.3 mg/dL — ABNORMAL LOW (ref 8.9–10.3)
Creatinine, Ser: 0.33 mg/dL — ABNORMAL LOW (ref 0.44–1.00)
GFR calc non Af Amer: >60 mL/min (ref >60)
Glucose, Bld: 131 mg/dL — ABNORMAL HIGH (ref 65–99)
POTASSIUM: 5.1 mmol/L (ref 3.5–5.1)
SODIUM: 136 mmol/L (ref 135–145)

## 2017-10-02 LAB — TROPONIN I

## 2017-10-02 LAB — PREGNANCY, URINE: PREG TEST UR: NEGATIVE

## 2017-10-02 MED ORDER — HYDROXYUREA 500 MG PO CAPS
1000.0000 mg | ORAL_CAPSULE | Freq: Every day | ORAL | Status: DC
  Administered 2017-10-02 – 2017-10-09 (×8): 1000 mg via ORAL
  Filled 2017-10-02 (×8): qty 2

## 2017-10-02 MED ORDER — KETOROLAC TROMETHAMINE 30 MG/ML IJ SOLN
30.0000 mg | Freq: Four times a day (QID) | INTRAMUSCULAR | Status: AC
  Administered 2017-10-02 – 2017-10-07 (×20): 30 mg via INTRAVENOUS
  Filled 2017-10-02 (×20): qty 1

## 2017-10-02 MED ORDER — FOLIC ACID 1 MG PO TABS
1.0000 mg | ORAL_TABLET | Freq: Every day | ORAL | Status: DC
  Administered 2017-10-02 – 2017-10-10 (×9): 1 mg via ORAL
  Filled 2017-10-02 (×9): qty 1

## 2017-10-02 MED ORDER — IPRATROPIUM-ALBUTEROL 0.5-2.5 (3) MG/3ML IN SOLN
3.0000 mL | RESPIRATORY_TRACT | Status: DC | PRN
Start: 2017-10-02 — End: 2017-10-11

## 2017-10-02 MED ORDER — DIPHENHYDRAMINE HCL 25 MG PO CAPS: 25.0000 mg | ORAL_CAPSULE | ORAL | Status: DC | PRN

## 2017-10-02 MED ORDER — SENNOSIDES-DOCUSATE SODIUM 8.6-50 MG PO TABS
1.0000 | ORAL_TABLET | Freq: Two times a day (BID) | ORAL | Status: DC
  Administered 2017-10-02 – 2017-10-11 (×18): 1 via ORAL
  Filled 2017-10-02 (×19): qty 1

## 2017-10-02 MED ORDER — HYDROMORPHONE 1 MG/ML IV SOLN
INTRAVENOUS | Status: DC
  Administered 2017-10-02: 3.5 mg via INTRAVENOUS
  Administered 2017-10-02: 2.5 mg via INTRAVENOUS
  Administered 2017-10-02: 1 mg via INTRAVENOUS
  Administered 2017-10-02: 0.5 mg via INTRAVENOUS
  Administered 2017-10-03: 5 mg via INTRAVENOUS
  Administered 2017-10-03: 3 mg via INTRAVENOUS
  Administered 2017-10-03: 2.29 mg via INTRAVENOUS
  Administered 2017-10-03 (×2): 3.5 mg via INTRAVENOUS
  Administered 2017-10-03: 10:00:00 via INTRAVENOUS
  Administered 2017-10-03: 2.7 mg via INTRAVENOUS
  Administered 2017-10-03: 8 mg via INTRAVENOUS
  Administered 2017-10-04: 3 mg via INTRAVENOUS
  Administered 2017-10-04: 3.5 mg via INTRAVENOUS
  Administered 2017-10-04: 0.5 mg via INTRAVENOUS
  Administered 2017-10-04: 13:00:00 via INTRAVENOUS
  Administered 2017-10-04: 4.5 mg via INTRAVENOUS
  Administered 2017-10-04: 3.5 mg via INTRAVENOUS
  Administered 2017-10-04: 1 mg via INTRAVENOUS
  Administered 2017-10-05: 5 mg via INTRAVENOUS
  Administered 2017-10-05 (×2): 3.5 mg via INTRAVENOUS
  Administered 2017-10-05: 4.5 mg via INTRAVENOUS
  Administered 2017-10-05: 5 mg via INTRAVENOUS
  Administered 2017-10-06: 3 mg via INTRAVENOUS
  Administered 2017-10-06: 5 mg via INTRAVENOUS
  Administered 2017-10-06: 1 mg via INTRAVENOUS
  Administered 2017-10-06: 3 mg via INTRAVENOUS
  Administered 2017-10-06: 22:00:00 via INTRAVENOUS
  Filled 2017-10-02 (×5): qty 25

## 2017-10-02 MED ORDER — NALOXONE HCL 0.4 MG/ML IJ SOLN: 0.4000 mg | INTRAMUSCULAR | Status: DC | PRN

## 2017-10-02 MED ORDER — HYDROMORPHONE HCL 1 MG/ML IJ SOLN
1.0000 mg | INTRAMUSCULAR | Status: AC
  Administered 2017-10-02: 1 mg via INTRAVENOUS
  Filled 2017-10-02: qty 1

## 2017-10-02 MED ORDER — SODIUM CHLORIDE 0.9% FLUSH: 9.0000 mL | INTRAVENOUS | Status: DC | PRN

## 2017-10-02 MED ORDER — ENOXAPARIN SODIUM 40 MG/0.4ML ~~LOC~~ SOLN
40.0000 mg | SUBCUTANEOUS | Status: DC
  Administered 2017-10-02 – 2017-10-11 (×10): 40 mg via SUBCUTANEOUS
  Filled 2017-10-02 (×11): qty 0.4

## 2017-10-02 MED ORDER — POLYETHYLENE GLYCOL 3350 17 G PO PACK
17.0000 g | PACK | Freq: Every day | ORAL | Status: DC | PRN
  Administered 2017-10-09: 17 g via ORAL
  Filled 2017-10-02: qty 1

## 2017-10-02 MED ORDER — ONDANSETRON HCL 4 MG/2ML IJ SOLN
4.0000 mg | Freq: Four times a day (QID) | INTRAMUSCULAR | Status: DC | PRN
  Administered 2017-10-03 – 2017-10-07 (×7): 4 mg via INTRAVENOUS
  Filled 2017-10-02 (×7): qty 2

## 2017-10-02 MED ORDER — DEXTROSE-NACL 5-0.45 % IV SOLN
INTRAVENOUS | Status: DC
  Administered 2017-10-02 – 2017-10-08 (×7): via INTRAVENOUS

## 2017-10-02 MED ORDER — SODIUM CHLORIDE 0.9 % IV SOLN
25.0000 mg | INTRAVENOUS | Status: DC | PRN
  Filled 2017-10-02: qty 0.5

## 2017-10-02 NOTE — ED Provider Notes (Signed)
Clinical Course as of Oct 02 602  Sun Oct 02, 2017  0100 Patient states that her pain is 4/10. Will give last dose and reevaluate.  [AH]    Clinical Course User Index [AH] Margarita Mail, PA-C    Patient pain poorly controlled and she will be admitted for pain crisis.   Margarita Mail, PA-C 79/15/05 [id number]    Delora Fuel, MD 48/01/65 [phone]

## 2017-10-02 NOTE — ED Notes (Signed)
Pt placed on 2L O2 

## 2017-10-02 NOTE — ED Notes (Signed)
ED TO INPATIENT HANDOFF REPORT  Name/Age/Gender Gabriella Griffin 27 y.o. female  Code Status    Code Status Orders  (From admission, onward)        Start     Ordered   10/02/17 0305  Full code  Continuous     10/02/17 0308    Code Status History    Date Active Date Inactive Code Status Order ID Comments User Context   07/31/2017 19:19 08/03/2017 20:31 Full Code [id number]  Bennie Pierini, MD ED   07/17/2017 07:42 07/20/2017 18:51 Full Code [id number]  Rondel Jumbo, PA-C Inpatient   07/09/2017 23:21 07/12/2017 19:56 Full Code [id number]  [name], MD ED   06/25/2017 03:08 06/29/2017 20:21 Full Code [id number]  [name], MD ED   05/08/2017 22:14 05/14/2017 21:30 Full Code [id number]  [name], MD ED   03/26/2016 16:52 03/30/2016 20:06 Full Code [id number]  [name], MD ED   08/25/2015 03:39 08/29/2015 15:28 Full Code [id number]  [name], MD Inpatient   08/21/2015 19:40 08/25/2015 03:38 Full Code [id number]  [name], [place] Inpatient   08/14/2015 13:32 08/21/2015 19:40 Full Code [id number]  [name], MD Inpatient   07/13/2015 18:34 07/18/2015 20:39 Full Code [id number]  [name], MD Inpatient   06/11/2015 12:10 06/16/2015 16:00 Full Code [id number]  [name], MD Inpatient   02/07/2015 02:51 02/09/2015 14:48 Full Code [id number]  [name], MD Inpatient      Home/SNF/Other Home  Chief Complaint Sickle cell crisis  Level of Care/Admitting Diagnosis ED Disposition    ED Disposition Condition [facility] Area: [place] [[id number]]  Level of Care: Telemetry [5]  Admit to tele based on following criteria: Complex arrhythmia (Bradycardia/Tachycardia)  Diagnosis: Sickle cell anemia with crisis [facility]) [159458]  Admitting Physician: Norval Morton [[id number]]  Attending Physician: Norval Morton [phone]  PT Class (Do Not Modify): Observation [104]  PT Acc Code (Do  Not Modify): Observation [10022]       Medical History Past Medical History:  Diagnosis Date  . Sickle cell anemia (HCC)   . Sickle cell disease (HCC)     Allergies Allergies  Allergen Reactions  . Ceftin [Cefuroxime Axetil] Rash    IV Location/Drains/Wounds Patient Lines/Drains/Airways Status   Active Line/Drains/Airways    Name:   Placement date:   Placement time:   Site:   Days:   Peripheral IV 10/01/17 Right;Upper Arm   10/01/17    2220    Arm   1          Labs/Imaging Results for orders placed or performed during the hospital encounter of 10/01/17 (from the past 48 hour(s))  Comprehensive metabolic panel     Status: Abnormal   Collection Time: 10/01/17  9:18 PM  Result Value Ref Range   Sodium 142 135 - 145 mmol/L   Potassium 3.8 3.5 - 5.1 mmol/L   Chloride 108 101 - 111 mmol/L   CO2 26 22 - 32 mmol/L   Glucose, Bld 93 65 - 99 mg/dL   BUN 5 (L) 6 - 20 mg/dL   Creatinine, Ser <0.30 (L) 0.44 - 1.00 mg/dL   Calcium 8.9 8.9 - 10.3 mg/dL   Total Protein 7.8 6.5 - 8.1 g/dL   Albumin 4.4 3.5 - 5.0 g/dL   AST 52 (H) 15 - 41 U/L   ALT 17 14 - 54 U/L   Alkaline Phosphatase 80 38 - 126 U/L  Total Bilirubin 5.1 (H) 0.3 - 1.2 mg/dL   GFR calc non Af Amer NOT CALCULATED >60 mL/min   GFR calc Af Amer NOT CALCULATED >60 mL/min    Comment: (NOTE) The eGFR has been calculated using the CKD EPI equation. This calculation has not been validated in all clinical situations. eGFR's persistently <60 mL/min signify possible Chronic Kidney Disease.    Anion gap 8 5 - 15    Comment: Performed at [facility], [place] [address] [place], [place] [address]  CBC with Differential     Status: Abnormal   Collection Time: 10/01/17  9:18 PM  Result Value Ref Range   WBC 9.8 4.0 - 10.5 K/uL   RBC 2.29 (L) 3.87 - 5.11 MIL/uL   Hemoglobin 8.3 (L) 12.0 - 15.0 g/dL   HCT 23.3 (L) 36.0 - 46.0 %   MCV 101.7 (H) 78.0 - 100.0 fL   MCH 36.2 (H) 26.0 - 34.0 pg   MCHC 35.6  30.0 - 36.0 g/dL   RDW 18.0 (H) 11.5 - 15.5 %   Platelets 381 150 - 400 K/uL   Neutrophils Relative % 42 %   Lymphocytes Relative 42 %   Monocytes Relative 13 %   Eosinophils Relative 2 %   Basophils Relative 1 %   nRBC 3 (H) 0 /100 WBC   Neutro Abs 4.1 1.7 - 7.7 K/uL   Lymphs Abs 4.1 (H) 0.7 - 4.0 K/uL   Monocytes Absolute 1.3 (H) 0.1 - 1.0 K/uL   Eosinophils Absolute 0.2 0.0 - 0.7 K/uL   Basophils Absolute 0.1 0.0 - 0.1 K/uL   RBC Morphology RARE NRBCs     Comment: Sickle cells present POLYCHROMASIA PRESENT TARGET CELLS    WBC Morphology WHITE COUNT CONFIRMED ON SMEAR    Smear Review PLATELET COUNT CONFIRMED BY SMEAR     Comment: Performed at [facility], [place] [address], [place], [place] [address]  Reticulocytes     Status: Abnormal   Collection Time: 10/01/17  9:18 PM  Result Value Ref Range   Retic Ct Pct >23.0 (H) 0.4 - 3.1 %   RBC. 2.29 (L) 3.87 - 5.11 MIL/uL   Retic Count, Absolute NOT CALCULATED 19.0 - 186.0 K/uL    Comment: Performed at [facility], [place] [address], [place], [place] [address]  I-Stat beta hCG blood, ED     Status: None   Collection Time: 10/01/17  9:41 PM  Result Value Ref Range   I-stat hCG, quantitative <5.0 <5 mIU/mL   Comment 3            Comment:   GEST. AGE      CONC.  (mIU/mL)   <=1 WEEK        5 - 50     2 WEEKS       50 - 500     3 WEEKS       100 - 10,000     4 WEEKS     1,000 - 30,000        FEMALE AND NON-PREGNANT FEMALE:     LESS THAN 5 mIU/mL    Dg Chest 2 View  Result Date: 10/01/2017 CLINICAL DATA:  Chest pain, history of sickle cell EXAM: CHEST  2 VIEW COMPARISON:  07/31/2017 FINDINGS: Mild cardiomegaly without aortic atherosclerosis. No acute pneumonic consolidation, CHF, effusion or pneumothorax. No acute osseous abnormality. IMPRESSION: Stable mild cardiomegaly.  No active pulmonary disease. Electronically Signed   By: Shanon Brow  Randel Pigg M.D.   On: 10/01/2017 20:59    Pending Labs Unresulted  Labs (From admission, onward)   Start     Ordered   10/02/17 0500  CBC with Differential/Platelet  Tomorrow morning,   R     10/02/17 0308   10/02/17 2957  Basic metabolic panel  Tomorrow morning,   R     10/02/17 0308   10/02/17 0452  Troponin I  Once,   R     10/02/17 0451   10/02/17 0450  Pregnancy, urine  STAT,   R     10/02/17 0449      Vitals/Pain Today's Vitals   10/02/17 0400 10/02/17 0445 10/02/17 0530 10/02/17 0537  BP: 106/62 95/63 111/80   Pulse: 75 64 (!) 58   Resp: 17 15 15    Temp:      TempSrc:      SpO2: 100% 100% 100%   PainSc:    6     Isolation Precautions No active isolations  Medications Medications  0.45 % sodium chloride infusion ( Intravenous New Bag/Given 10/01/17 2236)  HYDROmorphone (DILAUDID) injection 2 mg (2 mg Intravenous Given 10/02/17 0105)    Or  HYDROmorphone (DILAUDID) injection 2 mg ( Subcutaneous See Alternative 10/02/17 0105)  ondansetron (ZOFRAN) injection 4 mg (4 mg Intravenous Given 4/73/40 3709)  folic acid (FOLVITE) tablet 1 mg (not administered)  hydroxyurea (HYDREA) capsule 1,000 mg (not administered)  senna-docusate (Senokot-S) tablet 1 tablet (not administered)  polyethylene glycol (MIRALAX / GLYCOLAX) packet 17 g (not administered)  naloxone (NARCAN) injection 0.4 mg (not administered)    And  sodium chloride flush (NS) 0.9 % injection 9 mL (not administered)  ondansetron (ZOFRAN) injection 4 mg (not administered)  diphenhydrAMINE (BENADRYL) capsule 25 mg (not administered)    Or  diphenhydrAMINE (BENADRYL) 25 mg in sodium chloride 0.9 % 50 mL IVPB (not administered)  enoxaparin (LOVENOX) injection 40 mg (not administered)  ketorolac (TORADOL) 30 MG/ML injection 30 mg (30 mg Intravenous Given 10/02/17 0537)  dextrose 5 %-0.45 % sodium chloride infusion ( Intravenous New Bag/Given 10/02/17 0538)  HYDROmorphone (DILAUDID) 1 mg/mL PCA injection (not administered)  ipratropium-albuterol (DUONEB) 0.5-2.5 (3) MG/3ML nebulizer  solution 3 mL (not administered)  HYDROmorphone (DILAUDID) injection 1 mg (not administered)  ketorolac (TORADOL) 30 MG/ML injection 30 mg (30 mg Intravenous Given 10/01/17 2233)  HYDROmorphone (DILAUDID) injection 2 mg (2 mg Intravenous Given 10/01/17 2232)    Or  HYDROmorphone (DILAUDID) injection 2 mg ( Subcutaneous See Alternative 10/01/17 2232)  HYDROmorphone (DILAUDID) injection 2 mg (2 mg Intravenous Given 10/01/17 2304)    Or  HYDROmorphone (DILAUDID) injection 2 mg ( Subcutaneous See Alternative 10/01/17 2304)  HYDROmorphone (DILAUDID) injection 2 mg (2 mg Intravenous Given 10/01/17 2359)    Or  HYDROmorphone (DILAUDID) injection 2 mg ( Subcutaneous See Alternative 10/01/17 2359)  diphenhydrAMINE (BENADRYL) injection 25 mg (25 mg Intravenous Given 10/01/17 2233)    Mobility walks

## 2017-10-02 NOTE — H&P (Signed)
History and Physical    Gabriella Griffin IEP:[id number] DOB: [birth year] DOA: 10/01/2017  Referring MD/NP/PA: Margarita Mail PA-C PCP: Patient, No Pcp Per  Patient coming from: Home  Chief Complaint: Sickle cell pain  I have personally briefly reviewed patient's old medical records in [place]   HPI: Gabriella Griffin is a 27 y.o. female with medical history significant of sickle cell anemia; who presents with complaints of pain in her chest, legs back, and arms that have progressively worsened over the last 3-4 days.  Tried utilizing home pain medication Dilaudid with only temporary relief of symptoms.  Symptoms started after she started her period which she states is normal for her.  However, noted that her menstrual cycle only lasted 3 days and regularly last 6.  While at work yesterday patient had acute onset of centralized 10 out of 10 sharp chest pain which did not radiate.  Denies having any significant shortness of breath with symptoms.  Associated symptoms include nausea, vomiting, subjective fevers, recent sick contacts with coworker.  Also reports taking Tylenol cold and sinus with some improvement of congestion symptoms.  Denies any recent travel, prolonged immobilization, leg swelling, or calf pain.   ED Course: Upon admission into the emergency department patient was seen to be afebrile, pulse 64-93, respirations 10-31, blood pressure 77/45-132/68, O2 saturations maintained on room air.  Labs revealed WBC 9.8 and hemoglobin 8.3.  Chest x-ray showed stable mild cardiomegaly without acute infiltrate.  Patient was given multiple doses of IV Dilaudid without relief of symptoms.  TRH called to admit.  Review of Systems  Constitutional: Positive for fever. Negative for diaphoresis.  HENT: Positive for congestion. Negative for ear discharge.   Eyes: Negative for discharge.  Respiratory: Negative for cough and sputum production.   Cardiovascular: Positive for chest pain. Negative  for leg swelling.  Gastrointestinal: Positive for nausea and vomiting.  Genitourinary: Negative for dysuria and frequency.  Musculoskeletal: Positive for myalgias. Negative for falls.  Skin: Negative for itching and rash.  Neurological: Negative for focal weakness and seizures.  Psychiatric/Behavioral: Negative for memory loss and suicidal ideas.    Past Medical History:  Diagnosis Date  . Sickle cell anemia (HCC)   . Sickle cell disease ([place])     Past Surgical History:  Procedure Laterality Date  . CESAREAN SECTION N/A 08/25/2015   Procedure: CESAREAN SECTION;  Surgeon: [name], MD;  Location: [facility];  Service: Obstetrics;  Laterality: N/A;  . CHOLECYSTECTOMY  2007     reports that  has never smoked. she has never used smokeless tobacco. She reports that she does not drink alcohol or use drugs.  Allergies  Allergen Reactions  . Ceftin [Cefuroxime Axetil] Rash    Family History  Problem Relation Age of Onset  . Sickle cell anemia Sister   . Diabetes Paternal Grandmother   . Sickle cell trait Mother   . Sickle cell trait Father     Prior to Admission medications   Medication Sig Start Date End Date Taking? Authorizing Provider  DM-Phenylephrine-Acetaminophen (TYLENOL COLD MAX) 10-5-325 MG/15ML LIQD Take 15 mLs by mouth 2 (two) times daily as needed (FOR COLD SYMPTOMS.).   Yes [provider]  folic acid (FOLVITE) 1 MG tablet Take 2 tablets (2 mg total) by mouth daily. Patient taking differently: Take 1 mg by mouth at bedtime.  06/16/15  Yes [name], MD  HYDROmorphone (DILAUDID) 2 MG tablet Take 1 tablet (2 mg total) by mouth every 4 (four) hours as  needed for severe pain. Patient taking differently: Take 2 mg by mouth every 6 (six) hours as needed for severe pain.  08/27/15  Yes Woodroe Mode, MD  hydroxyurea (HYDREA) 500 MG capsule Take 1 capsule (500 mg total) by mouth daily. May take with food to minimize GI side effects. Patient taking  differently: Take 1,000 mg by mouth at bedtime. May take with food to minimize GI side effects. 08/29/15  Yes Seabron Spates, CNM    Physical Exam:  Constitutional: NAD, calm, comfortable Vitals:   10/02/17 0100 10/02/17 0133 10/02/17 0145 10/02/17 0217  BP: 109/71 109/71 123/78 123/78  Pulse: 70 69 67 64  Resp: 16 13 15 14   Temp:      TempSrc:      SpO2: 100% 100% 100% 100%   Eyes: PERRL, lids and conjunctivae normal ENMT: Mucous membranes are moist. Posterior pharynx clear of any exudate or lesions.Normal dentition.  Neck: normal, supple, no masses, no thyromegaly Respiratory: clear to auscultation bilaterally, no wheezing, no crackles. Normal respiratory effort. No accessory muscle use.  Cardiovascular: Regular rate and rhythm, no murmurs / rubs / gallops. No extremity edema. 2+ pedal pulses. No carotid bruits.  Abdomen: no tenderness, no masses palpated. No hepatosplenomegaly. Bowel sounds positive.  Musculoskeletal: no clubbing / cyanosis. No joint deformity upper and lower extremities. Good ROM, no contractures. Normal muscle tone.  Skin: no rashes, lesions, ulcers. No induration Neurologic: CN 2-12 grossly intact. Sensation intact, DTR normal. Strength 5/5 in all 4.  Psychiatric: Normal judgment and insight. Alert and oriented x 3. Normal mood.     Labs on Admission: I have personally reviewed following labs and imaging studies  CBC: Recent Labs  Lab 10/01/17 2118  WBC 9.8  NEUTROABS 4.1  HGB 8.3*  HCT 23.3*  MCV 101.7*  PLT 194   Basic Metabolic Panel: Recent Labs  Lab 10/01/17 2118  NA 142  K 3.8  CL 108  CO2 26  GLUCOSE 93  BUN 5*  CREATININE <0.30*  CALCIUM 8.9   GFR: CrCl cannot be calculated (This lab value cannot be used to calculate CrCl because it is not a number: <0.30). Liver Function Tests: Recent Labs  Lab 10/01/17 2118  AST 52*  ALT 17  ALKPHOS 80  BILITOT 5.1*  PROT 7.8  ALBUMIN 4.4   No results for input(s): LIPASE, AMYLASE  in the last 168 hours. No results for input(s): AMMONIA in the last 168 hours. Coagulation Profile: No results for input(s): INR, PROTIME in the last 168 hours. Cardiac Enzymes: No results for input(s): CKTOTAL, CKMB, CKMBINDEX, TROPONINI in the last 168 hours. BNP (last 3 results) No results for input(s): PROBNP in the last 8760 hours. HbA1C: No results for input(s): HGBA1C in the last 72 hours. CBG: No results for input(s): GLUCAP in the last 168 hours. Lipid Profile: No results for input(s): CHOL, HDL, LDLCALC, TRIG, CHOLHDL, LDLDIRECT in the last 72 hours. Thyroid Function Tests: No results for input(s): TSH, T4TOTAL, FREET4, T3FREE, THYROIDAB in the last 72 hours. Anemia Panel: Recent Labs    10/01/17 2118  RETICCTPCT >23.0*   Urine analysis:    Component Value Date/Time   COLORURINE YELLOW 07/17/2017 1800   APPEARANCEUR HAZY (A) 07/17/2017 1800   LABSPEC 1.008 07/17/2017 1800   PHURINE 5.0 07/17/2017 1800   GLUCOSEU NEGATIVE 07/17/2017 1800   HGBUR SMALL (A) 07/17/2017 1800   BILIRUBINUR NEGATIVE 07/17/2017 1800   KETONESUR NEGATIVE 07/17/2017 1800   PROTEINUR NEGATIVE 07/17/2017 1800  UROBILINOGEN 2.0 (H) 08/06/2015 1051   NITRITE NEGATIVE 07/17/2017 1800   LEUKOCYTESUR NEGATIVE 07/17/2017 1800   Sepsis Labs: No results found for this or any previous visit (from the past 240 hour(s)).   Radiological Exams on Admission: Dg Chest 2 View  Result Date: 10/01/2017 CLINICAL DATA:  Chest pain, history of sickle cell EXAM: CHEST  2 VIEW COMPARISON:  07/31/2017 FINDINGS: Mild cardiomegaly without aortic atherosclerosis. No acute pneumonic consolidation, CHF, effusion or pneumothorax. No acute osseous abnormality. IMPRESSION: Stable mild cardiomegaly.  No active pulmonary disease. Electronically Signed   By: Ashley Royalty M.D.   On: 10/01/2017 20:59    EKG: Independently reviewed.  Normal sinus rhythm  Assessment/Plan Sickle cell anemia with crisis: Acute.  Patient  presents with continued pain in chest, legs, arms, and back despite use hydrocodone at home.  Hemoglobin at 8.3 which appears to be around the patient's baseline.  Chest x-ray showing no acute infiltrate and stable cardiomegaly. - Admit to a telemetry bed - Sickle cell pain crisis order set initiated - IV fluids of D5 - 0.45% normal saline at 125 mL/h  - PCA order set initiated with Dilaudid  - Ketorolac 30 mg IV every 6 hours - Continue hydroxyurea and folic acid   Chest pain: EKG without any significant ST- wave changes and chest x-ray shows no acute infiltrate. - Check troponin   DVT prophylaxis: lovenox Code Status: full  Family Communication: No family present at bedside Disposition Plan:   Possible discharge home in 2-3 days Consults called: none Admission status: Observation Norval Morton MD [facility] Pager [phone]   If 7PM-7AM, please contact night-coverage [email or web address] Password [facility]  10/02/2017, 2:43 AM

## 2017-10-03 LAB — CBC WITH DIFFERENTIAL/PLATELET
Basophils Absolute: 0 10*3/uL (ref 0.0–0.1)
Basophils Relative: 0 %
EOS ABS: 0.1 10*3/uL (ref 0.0–0.7)
Eosinophils Relative: 1 %
HCT: 18.7 % — ABNORMAL LOW (ref 36.0–46.0)
Hemoglobin: 6.8 g/dL — CL (ref 12.0–15.0)
LYMPHS ABS: 5.2 10*3/uL — AB (ref 0.7–4.0)
LYMPHS PCT: 57 %
MCH: 36.2 pg — ABNORMAL HIGH (ref 26.0–34.0)
MCHC: 36.4 g/dL — ABNORMAL HIGH (ref 30.0–36.0)
MCV: 99.5 fL (ref 78.0–100.0)
MONO ABS: 0.6 10*3/uL (ref 0.1–1.0)
MONOS PCT: 6 %
MYELOCYTES: 1 %
NEUTROS PCT: 35 %
Neutro Abs: 3.3 10*3/uL (ref 1.7–7.7)
PLATELETS: 336 10*3/uL (ref 150–400)
RBC: 1.88 MIL/uL — ABNORMAL LOW (ref 3.87–5.11)
RDW: 18.9 % — AB (ref 11.5–15.5)
WBC: 9.2 10*3/uL (ref 4.0–10.5)
nRBC: 6 /100 WBC — ABNORMAL HIGH

## 2017-10-03 LAB — BASIC METABOLIC PANEL
ANION GAP: 8 (ref 5–15)
BUN: <5 mg/dL — ABNORMAL LOW (ref 6–20)
CALCIUM: 8.1 mg/dL — AB (ref 8.9–10.3)
CO2: 23 mmol/L (ref 22–32)
Chloride: 107 mmol/L (ref 101–111)
Creatinine, Ser: <0.3 mg/dL — ABNORMAL LOW (ref 0.44–1.00)
GLUCOSE: 96 mg/dL (ref 65–99)
POTASSIUM: 3.7 mmol/L (ref 3.5–5.1)
Sodium: 138 mmol/L (ref 135–145)

## 2017-10-03 LAB — RETICULOCYTES
RBC.: 2.29 MIL/uL — ABNORMAL LOW (ref 3.87–5.11)
RBC.: 1.88 MIL/uL — AB (ref 3.87–5.11)
Retic Ct Pct: >23 % — ABNORMAL HIGH (ref 0.4–3.1)

## 2017-10-03 MED ORDER — PROCHLORPERAZINE EDISYLATE 5 MG/ML IJ SOLN
5.0000 mg | Freq: Once | INTRAMUSCULAR | Status: DC
  Filled 2017-10-03: qty 2

## 2017-10-03 MED ORDER — HYDROMORPHONE HCL 2 MG PO TABS
2.0000 mg | ORAL_TABLET | Freq: Four times a day (QID) | ORAL | Status: DC
  Administered 2017-10-03 – 2017-10-04 (×4): 2 mg via ORAL
  Filled 2017-10-03 (×4): qty 1

## 2017-10-03 NOTE — Progress Notes (Signed)
[place] PROGRESS NOTE  Perlie Stene OEV:[id number] DOB: March 25, 1991 DOA: 10/01/2017 PCP: Patient, No Pcp Per  Assessment/Plan: Principal Problem:   Sickle cell anemia with crisis ([place]) Active Problems:   Chest pain  1. Hb SS with HPFH with crisis: Will schedule oral Dilaudid every 6 hours and continue Dilaudid PCA on a PRN basis. Continue Toradol.  2. Anemia of Chronic Disease: Hb at baseline considering IVF. Continue Hydrea.  3. Hyperkalemia: Resolved with IVF.  4. Psychological Stress: January is the anniversary of the loss of her baby and she expresses that she has been having excessive feelings of sadness and teariness. She denies any homicidal or suicidal ideations.     Code Status: Full Code Family Communication: N/A Disposition Plan: Not yet ready for discharge  [place].  Pager [phone]. If 7PM-7AM, please contact night-coverage.  10/03/2017, 5:31 PM  LOS: 1 day   Interim History: Pt reports pain in low back and legs and intermittently in the sternum at an intensity of 6/10. She has used 21.5 mg of Dilaudid with 44/43:demands/deliveries in the last 24 hours.   Consultants:  Daleen Bo  Procedures:  None  Antibiotics:  None    Objective: Vitals:   10/03/17 0747 10/03/17 0952 10/03/17 1230 10/03/17 1406  BP:    117/74  Pulse:    86  Resp: 12 14 17 13   Temp:    98 F (36.7 C)  TempSrc:    Oral  SpO2: 95% 94% 100% 100%  Weight:      Height:       Weight change:   Intake/Output Summary (Last 24 hours) at 10/03/2017 1731 Last data filed at 10/03/2017 1534 Gross per 24 hour  Intake 1694.83 ml  Output 900 ml  Net 794.83 ml     Physical Exam General: Alert, awake, oriented x3, in no acute distress.  HEENT: [place]/AT PEERL, EOMI, anicteric Neck: Trachea midline,  no masses, no thyromegal,y no JVD, no carotid bruit OROPHARYNX:  Moist, No exudate/ erythema/lesions.  Heart: Regular rate and rhythm, without murmurs, rubs,  gallops, PMI non-displaced, no heaves or thrills on palpation.  Lungs: Clear to auscultation, no wheezing or rhonchi noted. No increased vocal fremitus resonant to percussion  Abdomen: Soft, nontender, nondistended, positive bowel sounds, no masses no hepatosplenomegaly noted.  Neuro: No focal neurological deficits noted cranial nerves II through XII grossly intact.  Strength at baseline in bilateral upper and lower extremities. Musculoskeletal: No warmth swelling or erythema around joints, no spinal tenderness noted. Psychiatric: Patient alert and oriented x3, good insight and cognition, good recent to remote recall.     Data Reviewed: Basic Metabolic Panel: Recent Labs  Lab 10/01/17 2118 10/02/17 0607 10/03/17 1256  NA 142 136 138  K 3.8 5.1 3.7  CL 108 106 107  CO2 26 24 23   GLUCOSE 93 131* 96  BUN 5* 6 <5*  CREATININE <0.30* 0.33* <0.30*  CALCIUM 8.9 8.3* 8.1*   Liver Function Tests: Recent Labs  Lab 10/01/17 2118  AST 52*  ALT 17  ALKPHOS 80  BILITOT 5.1*  PROT 7.8  ALBUMIN 4.4   No results for input(s): LIPASE, AMYLASE in the last 168 hours. No results for input(s): AMMONIA in the last 168 hours. CBC: Recent Labs  Lab 10/01/17 2118 10/02/17 0607 10/03/17 1256  WBC 9.8 10.3 9.2  NEUTROABS 4.1 3.6 3.3  HGB 8.3* 7.7* 6.8*  HCT 23.3* 21.2* 18.7*  MCV 101.7* 100.5* 99.5  PLT 381 PLATELET CLUMPS NOTED ON SMEAR, COUNT  APPEARS ADEQUATE 336   Cardiac Enzymes: Recent Labs  Lab 10/02/17 0607  TROPONINI <0.03   BNP (last 3 results) No results for input(s): BNP in the last 8760 hours.  ProBNP (last 3 results) No results for input(s): PROBNP in the last 8760 hours.  CBG: No results for input(s): GLUCAP in the last 168 hours.  Recent Results (from the past 240 hour(s))  Respiratory Panel by PCR     Status: None   Collection Time: 10/02/17  8:15 AM  Result Value Ref Range Status   Adenovirus NOT DETECTED NOT DETECTED Final   Coronavirus 229E NOT DETECTED  NOT DETECTED Final   Coronavirus HKU1 NOT DETECTED NOT DETECTED Final   Coronavirus NL63 NOT DETECTED NOT DETECTED Final   Coronavirus OC43 NOT DETECTED NOT DETECTED Final   Metapneumovirus NOT DETECTED NOT DETECTED Final   Rhinovirus / Enterovirus NOT DETECTED NOT DETECTED Final   Influenza A NOT DETECTED NOT DETECTED Final   Influenza B NOT DETECTED NOT DETECTED Final   Parainfluenza Virus 1 NOT DETECTED NOT DETECTED Final   Parainfluenza Virus 2 NOT DETECTED NOT DETECTED Final   Parainfluenza Virus 3 NOT DETECTED NOT DETECTED Final   Parainfluenza Virus 4 NOT DETECTED NOT DETECTED Final   Respiratory Syncytial Virus NOT DETECTED NOT DETECTED Final   Bordetella pertussis NOT DETECTED NOT DETECTED Final   Chlamydophila pneumoniae NOT DETECTED NOT DETECTED Final   Mycoplasma pneumoniae NOT DETECTED NOT DETECTED Final    Comment: Performed at [facility], [address] [place], [place] [address]     Studies: Dg Chest 2 View  Result Date: 10/01/2017 CLINICAL DATA:  Chest pain, history of sickle cell EXAM: CHEST  2 VIEW COMPARISON:  07/31/2017 FINDINGS: Mild cardiomegaly without aortic atherosclerosis. No acute pneumonic consolidation, CHF, effusion or pneumothorax. No acute osseous abnormality. IMPRESSION: Stable mild cardiomegaly.  No active pulmonary disease. Electronically Signed   By: Ashley Royalty M.D.   On: 10/01/2017 20:59    Scheduled Meds: . enoxaparin (LOVENOX) injection  40 mg Subcutaneous Q24H  . folic acid  1 mg Oral QHS  . HYDROmorphone   Intravenous Q4H  . hydroxyurea  1,000 mg Oral QHS  . ketorolac  30 mg Intravenous Q6H  . senna-docusate  1 tablet Oral BID   Continuous Infusions: . sodium chloride 10 mL/hr at 10/01/17 2236  . dextrose 5 % and 0.45% NaCl 125 mL/hr at 10/03/17 1231  . diphenhydrAMINE      Principal Problem:   Sickle cell anemia with crisis [facility]) Active Problems:   Chest pain     In excess of 25 minutes spent during this visit.  Greater than 50% involved face to face contact with the patient for assessment, counseling and coordination of care.

## 2017-10-03 NOTE — Progress Notes (Addendum)
CRITICAL VALUE ALERT  Critical Value:  Hemoglobin 6.8   Date & Time Notied:  10/03/2017 1402  Provider Notified: Zigmund Daniel  Orders Received/Actions taken: Will continue to monitor patient. No new orders received at this time.

## 2017-10-04 MED ORDER — HYDROMORPHONE HCL 2 MG PO TABS
2.0000 mg | ORAL_TABLET | ORAL | Status: DC
  Administered 2017-10-04 – 2017-10-07 (×18): 2 mg via ORAL
  Filled 2017-10-04 (×18): qty 1

## 2017-10-04 NOTE — Progress Notes (Signed)
[facility] PROGRESS NOTE  Gabriella Griffin [id number] DOB: Apr 14, 1991 DOA: 10/01/2017 PCP: Patient, No Pcp Per  Assessment/Plan: Principal Problem:   Sickle cell anemia with crisis ([place]) Active Problems:   Chest pain  1. Hb SS with crisis: Continue scheduled oral Dilaudid every 6 hours.  Continue to wean the PCA.  Continue Toradol. 2. Anemia of chronic disease: Patient has mild decrease in her hemoglobin down to 6.1 g/dL.  However she has a robust reticulocytosis of greater than 23.  I will continue hydroxyurea and continue to monitor.  She is asymptomatic from her anemia. 3. Constipation: The patient has not had a bowel movement since coming to the hospital.  She has been receiving senna-s and has taken MiraLAX today.  If she has no improvement in GI elimination I will consider lactulose. 4. Mobility: Patient is able to ambulate but has been resistant to increasing her activity.  She states that she will ambulate this evening when her boyfriend comes to visit.  Code Status: Full Code Family Communication: N/A Disposition Plan: Not yet ready for discharge  [place].  Pager [phone]. If 7PM-7AM, please contact night-coverage.  10/04/2017, 6:39 PM  LOS: 2 days   Interim History: Patient reports pain is 4/10 and localized to the sternum, back and legs.  She is used 20 mg of Dilaudid on the PCA with 42/40: Demand/deliveries in the last 24 hours.  Consultants:  None  Procedures:  None  Antibiotics:  None    Objective: Vitals:   10/04/17 1309 10/04/17 1430 10/04/17 1630 10/04/17 1749  BP:  120/61  119/68  Pulse:  82  80  Resp: 16 16 15 16   Temp:  98.8 F (37.1 C)  98.6 F (37 C)  TempSrc:  Oral  Oral  SpO2: 93% 95% 94% 95%  Weight:      Height:       Weight change:   Intake/Output Summary (Last 24 hours) at 10/04/2017 1839 Last data filed at 10/04/2017 1400 Gross per 24 hour  Intake 240 ml  Output 2700 ml  Net -2460 ml      Physical  Exam General: Alert, awake, oriented x 3, in mild distress due to pain.  HEENT: [place]/AT PEERL, EOMI, mild icterus Neck: Trachea midline,  no masses, no thyromegal,y no JVD, no carotid bruit OROPHARYNX:  Moist, No exudate/ erythema/lesions.  Heart: Regular rate and rhythm, without murmurs, rubs, gallops, PMI non-displaced, no heaves or thrills on palpation.  Lungs: Clear to auscultation, no wheezing or rhonchi noted. No increased vocal fremitus resonant to percussion  Abdomen: Soft, nontender, nondistended, positive bowel sounds, no masses no hepatosplenomegaly noted.  Neuro: No focal neurological deficits noted cranial nerves II through XII grossly intact.  Strength at baseline in bilateral upper and lower extremities. Musculoskeletal: No warmth swelling or erythema around joints, no spinal tenderness noted. Psychiatric: Patient alert and oriented x3, good insight and cognition, good recent to remote recall.    Data Reviewed: Basic Metabolic Panel: Recent Labs  Lab 10/01/17 2118 10/02/17 0607 10/03/17 1256  NA 142 136 138  K 3.8 5.1 3.7  CL 108 106 107  CO2 26 24 23   GLUCOSE 93 131* 96  BUN 5* 6 <5*  CREATININE <0.30* 0.33* <0.30*  CALCIUM 8.9 8.3* 8.1*   Liver Function Tests: Recent Labs  Lab 10/01/17 2118  AST 52*  ALT 17  ALKPHOS 80  BILITOT 5.1*  PROT 7.8  ALBUMIN 4.4   No results for input(s): LIPASE, AMYLASE in the last  168 hours. No results for input(s): AMMONIA in the last 168 hours. CBC: Recent Labs  Lab 10/01/17 2118 10/02/17 0607 10/03/17 1256  WBC 9.8 10.3 9.2  NEUTROABS 4.1 3.6 3.3  HGB 8.3* 7.7* 6.8*  HCT 23.3* 21.2* 18.7*  MCV 101.7* 100.5* 99.5  PLT 381 PLATELET CLUMPS NOTED ON SMEAR, COUNT APPEARS ADEQUATE 336   Cardiac Enzymes: Recent Labs  Lab 10/02/17 0607  TROPONINI <0.03   BNP (last 3 results) No results for input(s): BNP in the last 8760 hours.  ProBNP (last 3 results) No results for input(s): PROBNP in the last 8760  hours.  CBG: No results for input(s): GLUCAP in the last 168 hours.  Recent Results (from the past 240 hour(s))  Respiratory Panel by PCR     Status: None   Collection Time: 10/02/17  8:15 AM  Result Value Ref Range Status   Adenovirus NOT DETECTED NOT DETECTED Final   Coronavirus 229E NOT DETECTED NOT DETECTED Final   Coronavirus HKU1 NOT DETECTED NOT DETECTED Final   Coronavirus NL63 NOT DETECTED NOT DETECTED Final   Coronavirus OC43 NOT DETECTED NOT DETECTED Final   Metapneumovirus NOT DETECTED NOT DETECTED Final   Rhinovirus / Enterovirus NOT DETECTED NOT DETECTED Final   Influenza A NOT DETECTED NOT DETECTED Final   Influenza B NOT DETECTED NOT DETECTED Final   Parainfluenza Virus 1 NOT DETECTED NOT DETECTED Final   Parainfluenza Virus 2 NOT DETECTED NOT DETECTED Final   Parainfluenza Virus 3 NOT DETECTED NOT DETECTED Final   Parainfluenza Virus 4 NOT DETECTED NOT DETECTED Final   Respiratory Syncytial Virus NOT DETECTED NOT DETECTED Final   Bordetella pertussis NOT DETECTED NOT DETECTED Final   Chlamydophila pneumoniae NOT DETECTED NOT DETECTED Final   Mycoplasma pneumoniae NOT DETECTED NOT DETECTED Final    Comment: Performed at [facility], [address], [place], [place] [address]     Studies: Dg Chest 2 View  Result Date: 10/01/2017 CLINICAL DATA:  Chest pain, history of sickle cell EXAM: CHEST  2 VIEW COMPARISON:  07/31/2017 FINDINGS: Mild cardiomegaly without aortic atherosclerosis. No acute pneumonic consolidation, CHF, effusion or pneumothorax. No acute osseous abnormality. IMPRESSION: Stable mild cardiomegaly.  No active pulmonary disease. Electronically Signed   By: Ashley Royalty M.D.   On: 10/01/2017 20:59    Scheduled Meds: . enoxaparin (LOVENOX) injection  40 mg Subcutaneous Q24H  . folic acid  1 mg Oral QHS  . HYDROmorphone   Intravenous Q4H  . HYDROmorphone  2 mg Oral Q4H  . hydroxyurea  1,000 mg Oral QHS  . ketorolac  30 mg Intravenous Q6H  .  prochlorperazine  5 mg Intravenous Once  . senna-docusate  1 tablet Oral BID   Continuous Infusions: . sodium chloride 10 mL/hr at 10/01/17 2236  . dextrose 5 % and 0.45% NaCl 10 mL/hr at 10/04/17 1202  . diphenhydrAMINE      Principal Problem:   Sickle cell anemia with crisis [facility]) Active Problems:   Chest pain    In excess of 25 minutes spent during this visit. Greater than 50% involved face to face contact with the patient for assessment, counseling and coordination of care.

## 2017-10-05 DIAGNOSIS — F341 Dysthymic disorder: Secondary | ICD-10-CM

## 2017-10-05 LAB — CBC WITH DIFFERENTIAL/PLATELET
BASOS ABS: 0.1 10*3/uL (ref 0.0–0.1)
BASOS PCT: 1 %
EOS PCT: 7 %
Eosinophils Absolute: 0.8 10*3/uL — ABNORMAL HIGH (ref 0.0–0.7)
HEMATOCRIT: 16.1 % — AB (ref 36.0–46.0)
Hemoglobin: 6.1 g/dL — CL (ref 12.0–15.0)
Lymphocytes Relative: 29 %
Lymphs Abs: 3.3 10*3/uL (ref 0.7–4.0)
MCH: 37.7 pg — ABNORMAL HIGH (ref 26.0–34.0)
MCHC: 37.9 g/dL — AB (ref 30.0–36.0)
MCV: 99.4 fL (ref 78.0–100.0)
MONO ABS: 1.1 10*3/uL — AB (ref 0.1–1.0)
Monocytes Relative: 10 %
NEUTROS ABS: 6 10*3/uL (ref 1.7–7.7)
NEUTROS PCT: 53 %
PLATELETS: 279 10*3/uL (ref 150–400)
RBC: 1.62 MIL/uL — ABNORMAL LOW (ref 3.87–5.11)
RDW: 21.2 % — AB (ref 11.5–15.5)
WBC: 11.3 10*3/uL — AB (ref 4.0–10.5)
nRBC: 11 /100 WBC — ABNORMAL HIGH

## 2017-10-05 LAB — BASIC METABOLIC PANEL
ANION GAP: 10 (ref 5–15)
BUN: 5 mg/dL — AB (ref 6–20)
CHLORIDE: 109 mmol/L (ref 101–111)
CO2: 22 mmol/L (ref 22–32)
Calcium: 8.3 mg/dL — ABNORMAL LOW (ref 8.9–10.3)
Creatinine, Ser: <0.3 mg/dL — ABNORMAL LOW (ref 0.44–1.00)
GLUCOSE: 84 mg/dL (ref 65–99)
POTASSIUM: 4.3 mmol/L (ref 3.5–5.1)
Sodium: 141 mmol/L (ref 135–145)

## 2017-10-05 LAB — RETICULOCYTES
RBC.: 1.62 MIL/uL — ABNORMAL LOW (ref 3.87–5.11)
Retic Ct Pct: >23 % — ABNORMAL HIGH (ref 0.4–3.1)

## 2017-10-05 NOTE — Progress Notes (Signed)
Attempted to walk pt twice with tech trying once. Pt refused to walk at this time even when explained why and the need to see her O2 while ambulating. Pt states she does not feel well enough to walk at all

## 2017-10-05 NOTE — Progress Notes (Signed)
Patient IV site assessed by IV team and recommended to remove. IV site removed, cath intact. Site is edematous, warm pack placed and elevated on pillow. Patient has new IV site placed to right hand. Will c/t monitor.

## 2017-10-05 NOTE — Progress Notes (Signed)
Patient IV site to right upper arm is firm, slightly edematous at insertion site, but  appears to be infusing without difficulty. Patient reports is nontender, does not appear red. Patient states has been firm since earlier today and she discussed with Dr. Zigmund Daniel who looked at it and felt like it was ok, but would have an ultrasound of site. Pt does not want to remove site unless absolutely necessary because she is very difficult stick. Advised that RN would contact IV team to come view site for second opinion. Will c/t monitor. Advised to keep site elevated.

## 2017-10-05 NOTE — Progress Notes (Signed)
[place] PROGRESS NOTE  Gabriella Griffin [id number] DOB: November 17, 1990 DOA: 10/01/2017 PCP: Patient, No Pcp Per  Assessment/Plan: Principal Problem:   Sickle cell anemia with crisis ([place]) Active Problems:   Chest pain  1. Hb SS with crisis: Increased frequency of the oral Dilaudid to every 4 hours.  Continue to wean the PCA and continue her Toradol. 2. Anemia of chronic disease: Continue hydroxyurea and continue to monitor hemoglobin.  Patient is asymptomatic 3. Constipation: Patient wants to try MiraLAX for 1 more day before she tries to lactulose.  She still has not had a bowel movement since admission. 4. Dysthymia: The patient has been poorly mobile and reluctant to get out of bed.  She has had depression in the past but has refused to take any antidepressant medications offered before.  She some months that is the anniversary of the death of her child about 2 years ago.  Currently she has no suicidal or homicidal ideations does not feel that she needs to see a psychiatrist at this time.  I have inquired about chaplain services and the patient is also states that she does not feel that a chaplain services are necessary at this time.  Code Status: Full Code Family Communication: N/A Disposition Plan: Not yet ready for discharge  [place].  Pager [phone]. If 7PM-7AM, please contact night-coverage.  10/05/2017, 5:15 PM  LOS: 3 days   Interim History: Patient reports that the pain is increasing again and her sternum.  She rates the pain today at 6/10.  She is used 20 mg of Dilaudid on the PCA with 44/40: Demand/deliveries in the last 24 hours.  Consultants:  None  Procedures:  None  Antibiotics:  None    Objective: Vitals:   10/05/17 0812 10/05/17 1025 10/05/17 1135 10/05/17 1417  BP:  110/65  113/68  Pulse:  80  80  Resp: 16  15 19   Temp:  98.5 F (36.9 C)  99 F (37.2 C)  TempSrc:  Oral  Oral  SpO2: 93% 92% 94% 90%  Weight:      Height:        Weight change:  No intake or output data in the 24 hours ending 10/05/17 1715  .  Physical Exam General: Alert, awake, oriented x3, in no acute distress.  HEENT: /AT PEERL, EOMI, mild icterus Neck: Trachea midline,  no masses, no thyromegal,y no JVD, no carotid bruit OROPHARYNX:  Moist, No exudate/ erythema/lesions.  Heart: Regular rate and rhythm, without murmurs, rubs, gallops, PMI non-displaced, no heaves or thrills on palpation.  Lungs: Clear to auscultation, no wheezing or rhonchi noted. No increased vocal fremitus resonant to percussion  Abdomen: Soft, nontender, nondistended, positive bowel sounds, no masses no hepatosplenomegaly noted.  Neuro: No focal neurological deficits noted cranial nerves II through XII grossly intact.  Strength at baseline in bilateral upper and lower extremities. Musculoskeletal: No warmth swelling or erythema around joints, no spinal tenderness noted. Psychiatric: Patient alert and oriented x3, good insight and cognition, good recent to remote recall.    Data Reviewed: Basic Metabolic Panel: Recent Labs  Lab 10/01/17 2118 10/02/17 0607 10/03/17 1256 10/05/17 0609  NA 142 136 138 141  K 3.8 5.1 3.7 4.3  CL 108 106 107 109  CO2 26 24 23 22   GLUCOSE 93 131* 96 84  BUN 5* 6 <5* 5*  CREATININE <0.30* 0.33* <0.30* <0.30*  CALCIUM 8.9 8.3* 8.1* 8.3*   Liver Function Tests: Recent Labs  Lab 10/01/17 2118  AST  52*  ALT 17  ALKPHOS 80  BILITOT 5.1*  PROT 7.8  ALBUMIN 4.4   No results for input(s): LIPASE, AMYLASE in the last 168 hours. No results for input(s): AMMONIA in the last 168 hours. CBC: Recent Labs  Lab 10/01/17 2118 10/02/17 0607 10/03/17 1256 10/05/17 0609  WBC 9.8 10.3 9.2 11.3*  NEUTROABS 4.1 3.6 3.3 6.0  HGB 8.3* 7.7* 6.8* 6.1*  HCT 23.3* 21.2* 18.7* 16.1*  MCV 101.7* 100.5* 99.5 99.4  PLT 381 PLATELET CLUMPS NOTED ON SMEAR, COUNT APPEARS ADEQUATE 336 279   Cardiac Enzymes: Recent Labs  Lab 10/02/17 0607   TROPONINI <0.03   BNP (last 3 results) No results for input(s): BNP in the last 8760 hours.  ProBNP (last 3 results) No results for input(s): PROBNP in the last 8760 hours.  CBG: No results for input(s): GLUCAP in the last 168 hours.  Recent Results (from the past 240 hour(s))  Respiratory Panel by PCR     Status: None   Collection Time: 10/02/17  8:15 AM  Result Value Ref Range Status   Adenovirus NOT DETECTED NOT DETECTED Final   Coronavirus 229E NOT DETECTED NOT DETECTED Final   Coronavirus HKU1 NOT DETECTED NOT DETECTED Final   Coronavirus NL63 NOT DETECTED NOT DETECTED Final   Coronavirus OC43 NOT DETECTED NOT DETECTED Final   Metapneumovirus NOT DETECTED NOT DETECTED Final   Rhinovirus / Enterovirus NOT DETECTED NOT DETECTED Final   Influenza A NOT DETECTED NOT DETECTED Final   Influenza B NOT DETECTED NOT DETECTED Final   Parainfluenza Virus 1 NOT DETECTED NOT DETECTED Final   Parainfluenza Virus 2 NOT DETECTED NOT DETECTED Final   Parainfluenza Virus 3 NOT DETECTED NOT DETECTED Final   Parainfluenza Virus 4 NOT DETECTED NOT DETECTED Final   Respiratory Syncytial Virus NOT DETECTED NOT DETECTED Final   Bordetella pertussis NOT DETECTED NOT DETECTED Final   Chlamydophila pneumoniae NOT DETECTED NOT DETECTED Final   Mycoplasma pneumoniae NOT DETECTED NOT DETECTED Final    Comment: Performed at [facility], [address] [place], [place] [address]     Studies: Dg Chest 2 View  Result Date: 10/01/2017 CLINICAL DATA:  Chest pain, history of sickle cell EXAM: CHEST  2 VIEW COMPARISON:  07/31/2017 FINDINGS: Mild cardiomegaly without aortic atherosclerosis. No acute pneumonic consolidation, CHF, effusion or pneumothorax. No acute osseous abnormality. IMPRESSION: Stable mild cardiomegaly.  No active pulmonary disease. Electronically Signed   By: Ashley Royalty M.D.   On: 10/01/2017 20:59    Scheduled Meds: . enoxaparin (LOVENOX) injection  40 mg Subcutaneous Q24H  .  folic acid  1 mg Oral QHS  . HYDROmorphone   Intravenous Q4H  . HYDROmorphone  2 mg Oral Q4H  . hydroxyurea  1,000 mg Oral QHS  . ketorolac  30 mg Intravenous Q6H  . prochlorperazine  5 mg Intravenous Once  . senna-docusate  1 tablet Oral BID   Continuous Infusions: . sodium chloride 10 mL/hr at 10/01/17 2236  . dextrose 5 % and 0.45% NaCl 10 mL/hr at 10/04/17 1202    Principal Problem:   Sickle cell anemia with crisis [facility]) Active Problems:   Chest pain       In excess of 35 minutes spent during this visit. Greater than 50% involved face to face contact with the patient for assessment, counseling and coordination of care.

## 2017-10-06 ENCOUNTER — Inpatient Hospital Stay (HOSPITAL_COMMUNITY): Payer: Self-pay

## 2017-10-06 DIAGNOSIS — K5900 Constipation, unspecified: Secondary | ICD-10-CM

## 2017-10-06 DIAGNOSIS — D638 Anemia in other chronic diseases classified elsewhere: Secondary | ICD-10-CM

## 2017-10-06 DIAGNOSIS — M7989 Other specified soft tissue disorders: Secondary | ICD-10-CM

## 2017-10-06 DIAGNOSIS — D5700 Hb-SS disease with crisis, unspecified: Principal | ICD-10-CM

## 2017-10-06 DIAGNOSIS — R112 Nausea with vomiting, unspecified: Secondary | ICD-10-CM

## 2017-10-06 DIAGNOSIS — G894 Chronic pain syndrome: Secondary | ICD-10-CM

## 2017-10-06 MED ORDER — LACTULOSE 10 GM/15ML PO SOLN
20.0000 g | Freq: Once | ORAL | Status: AC
  Administered 2017-10-06: 20 g via ORAL
  Filled 2017-10-06: qty 30

## 2017-10-06 NOTE — Progress Notes (Signed)
[place] PROGRESS NOTE  Gabriella Griffin GNF:[id number] DOB: [birth year] DOA: 10/01/2017 PCP: Patient, No Pcp Per  Assessment/Plan: Principal Problem:   Sickle cell anemia with crisis ([place]) Active Problems:   Chest pain   1. Hb SS with Crisis: Continue Scheduled oral Dilaudid and PCA. Continue Toradol.  2. Emesis: Likely related to constipation. Pt frequently has emesis associated with constipation. Will treat constipation. Abdomen soft and non-surgical.  3. Constipation:Lactulose x 1 today. 4. Anemia of Chronic DIsease: She has Hb decreased from baseline but expected in a setting of crisis. Will recheck labs tomorrow.  5. Leukocytosis: Pt had a mild leukocytosis yesterday but no signs of infection.    Code Status: Full Code Family Communication: N/A Disposition Plan: Not yet ready for discharge  [place].  Pager [phone]. If 7PM-7AM, please contact night-coverage.  10/06/2017, 3:42 PM  LOS: 4 days   Interim History: Pt reports that the pain is localized to her back, legs and sternum at an intensity of 6/10. She has used 20.5 mg of Dilaudid with 41/41:demands/deliveries in the last 24 hours. She had 2 episodes of emesis today.   Consultants:  None  Procedures:  None  Antibiotics:  None   Objective: Vitals:   10/05/17 2354 10/06/17 0400 10/06/17 0429 10/06/17 0801  BP:   (!) 105/55   Pulse:   74   Resp: 20 13 18 17   Temp:   98.4 F (36.9 C)   TempSrc:   Oral   SpO2: 97% 98% 98% 98%  Weight:      Height:       Weight change:   Intake/Output Summary (Last 24 hours) at 10/06/2017 1542 Last data filed at 10/06/2017 0548 Gross per 24 hour  Intake 950.42 ml  Output 2000 ml  Net -1049.58 ml     Physical Exam General: Alert, awake, oriented x3, in mild distress due to pain.  HEENT: [place]/AT PEERL, EOMI, mild icterus.  Neck: Trachea midline,  no masses, no thyromegal,y no JVD, no carotid bruit OROPHARYNX:  Moist, No exudate/ erythema/lesions.   Heart: Regular rate and rhythm, without murmurs, rubs, gallops, PMI non-displaced, no heaves or thrills on palpation.  Lungs: Clear to auscultation, no wheezing or rhonchi noted. No increased vocal fremitus resonant to percussion  Abdomen: Soft, nontender, nondistended, positive bowel sounds, no masses no hepatosplenomegaly noted.  Neuro: No focal neurological deficits noted cranial nerves II through XII grossly intact.  Strength at baseline in bilateral upper and lower extremities. Musculoskeletal: No warmth swelling or erythema around joints, no spinal tenderness noted. Psychiatric: Patient alert and oriented x3, good insight and cognition, good recent to remote recall.     Data Reviewed: Basic Metabolic Panel: Recent Labs  Lab 10/01/17 2118 10/02/17 0607 10/03/17 1256 10/05/17 0609  NA 142 136 138 141  K 3.8 5.1 3.7 4.3  CL 108 106 107 109  CO2 26 24 23 22   GLUCOSE 93 131* 96 84  BUN 5* 6 <5* 5*  CREATININE <0.30* 0.33* <0.30* <0.30*  CALCIUM 8.9 8.3* 8.1* 8.3*   Liver Function Tests: Recent Labs  Lab 10/01/17 2118  AST 52*  ALT 17  ALKPHOS 80  BILITOT 5.1*  PROT 7.8  ALBUMIN 4.4   No results for input(s): LIPASE, AMYLASE in the last 168 hours. No results for input(s): AMMONIA in the last 168 hours. CBC: Recent Labs  Lab 10/01/17 2118 10/02/17 0607 10/03/17 1256 10/05/17 0609  WBC 9.8 10.3 9.2 11.3*  NEUTROABS 4.1 3.6 3.3 6.0  HGB  8.3* 7.7* 6.8* 6.1*  HCT 23.3* 21.2* 18.7* 16.1*  MCV 101.7* 100.5* 99.5 99.4  PLT 381 PLATELET CLUMPS NOTED ON SMEAR, COUNT APPEARS ADEQUATE 336 279   Cardiac Enzymes: Recent Labs  Lab 10/02/17 0607  TROPONINI <0.03   BNP (last 3 results) No results for input(s): BNP in the last 8760 hours.  ProBNP (last 3 results) No results for input(s): PROBNP in the last 8760 hours.  CBG: No results for input(s): GLUCAP in the last 168 hours.  Recent Results (from the past 240 hour(s))  Respiratory Panel by PCR     Status: None    Collection Time: 10/02/17  8:15 AM  Result Value Ref Range Status   Adenovirus NOT DETECTED NOT DETECTED Final   Coronavirus 229E NOT DETECTED NOT DETECTED Final   Coronavirus HKU1 NOT DETECTED NOT DETECTED Final   Coronavirus NL63 NOT DETECTED NOT DETECTED Final   Coronavirus OC43 NOT DETECTED NOT DETECTED Final   Metapneumovirus NOT DETECTED NOT DETECTED Final   Rhinovirus / Enterovirus NOT DETECTED NOT DETECTED Final   Influenza A NOT DETECTED NOT DETECTED Final   Influenza B NOT DETECTED NOT DETECTED Final   Parainfluenza Virus 1 NOT DETECTED NOT DETECTED Final   Parainfluenza Virus 2 NOT DETECTED NOT DETECTED Final   Parainfluenza Virus 3 NOT DETECTED NOT DETECTED Final   Parainfluenza Virus 4 NOT DETECTED NOT DETECTED Final   Respiratory Syncytial Virus NOT DETECTED NOT DETECTED Final   Bordetella pertussis NOT DETECTED NOT DETECTED Final   Chlamydophila pneumoniae NOT DETECTED NOT DETECTED Final   Mycoplasma pneumoniae NOT DETECTED NOT DETECTED Final    Comment: Performed at [facility], [address], [place], [place] [address]     Studies: Dg Chest 2 View  Result Date: 10/01/2017 CLINICAL DATA:  Chest pain, history of sickle cell EXAM: CHEST  2 VIEW COMPARISON:  07/31/2017 FINDINGS: Mild cardiomegaly without aortic atherosclerosis. No acute pneumonic consolidation, CHF, effusion or pneumothorax. No acute osseous abnormality. IMPRESSION: Stable mild cardiomegaly.  No active pulmonary disease. Electronically Signed   By: Ashley Royalty M.D.   On: 10/01/2017 20:59    Scheduled Meds: . enoxaparin (LOVENOX) injection  40 mg Subcutaneous Q24H  . folic acid  1 mg Oral QHS  . HYDROmorphone   Intravenous Q4H  . HYDROmorphone  2 mg Oral Q4H  . hydroxyurea  1,000 mg Oral QHS  . ketorolac  30 mg Intravenous Q6H  . lactulose  20 g Oral Once  . prochlorperazine  5 mg Intravenous Once  . senna-docusate  1 tablet Oral BID   Continuous Infusions: . sodium chloride 10 mL/hr at  10/01/17 2236  . dextrose 5 % and 0.45% NaCl 10 mL/hr at 10/04/17 1202    Principal Problem:   Sickle cell anemia with crisis [facility]) Active Problems:   Chest pain    In excess of 25 minutes spent during this visit. Greater than 50% involved face to face contact with the patient for assessment, counseling and coordination of care.

## 2017-10-06 NOTE — Progress Notes (Signed)
RUE venous duplex prelim: negative for DVT. Isolated superficial thrombosis is noted in a branch of the right cephalic vein. Landry Mellow, RDMS, RVT

## 2017-10-07 ENCOUNTER — Inpatient Hospital Stay (HOSPITAL_COMMUNITY): Payer: Self-pay

## 2017-10-07 DIAGNOSIS — D72829 Elevated white blood cell count, unspecified: Secondary | ICD-10-CM

## 2017-10-07 LAB — BASIC METABOLIC PANEL
Anion gap: 11 (ref 5–15)
BUN: <5 mg/dL — ABNORMAL LOW (ref 6–20)
CO2: 25 mmol/L (ref 22–32)
Calcium: 8.6 mg/dL — ABNORMAL LOW (ref 8.9–10.3)
Chloride: 103 mmol/L (ref 101–111)
Creatinine, Ser: 0.39 mg/dL — ABNORMAL LOW (ref 0.44–1.00)
GFR calc Af Amer: >60 mL/min (ref >60)
GFR calc non Af Amer: >60 mL/min (ref >60)
Glucose, Bld: 89 mg/dL (ref 65–99)
Potassium: 3.3 mmol/L — ABNORMAL LOW (ref 3.5–5.1)
Sodium: 139 mmol/L (ref 135–145)

## 2017-10-07 LAB — RETICULOCYTES
RBC.: 1.78 MIL/uL — ABNORMAL LOW (ref 3.87–5.11)
Retic Ct Pct: >23 % — ABNORMAL HIGH (ref 0.4–3.1)

## 2017-10-07 LAB — CBC WITH DIFFERENTIAL/PLATELET
Basophils Absolute: 0 10*3/uL (ref 0.0–0.1)
Basophils Relative: 0 %
Eosinophils Absolute: 0.6 10*3/uL (ref 0.0–0.7)
Eosinophils Relative: 4 %
HCT: 18.8 % — ABNORMAL LOW (ref 36.0–46.0)
Hemoglobin: 7 g/dL — ABNORMAL LOW (ref 12.0–15.0)
Lymphocytes Relative: 15 %
Lymphs Abs: 2.1 10*3/uL (ref 0.7–4.0)
MCH: 38.8 pg — ABNORMAL HIGH (ref 26.0–34.0)
MCHC: 36.7 g/dL — ABNORMAL HIGH (ref 30.0–36.0)
MCV: 105.6 fL — ABNORMAL HIGH (ref 78.0–100.0)
Monocytes Absolute: 1.1 10*3/uL — ABNORMAL HIGH (ref 0.1–1.0)
Monocytes Relative: 8 %
Neutro Abs: 10.2 10*3/uL — ABNORMAL HIGH (ref 1.7–7.7)
Neutrophils Relative %: 73 %
Platelets: 340 10*3/uL (ref 150–400)
RBC: 1.78 MIL/uL — ABNORMAL LOW (ref 3.87–5.11)
RDW: 21.4 % — ABNORMAL HIGH (ref 11.5–15.5)
WBC: 14 10*3/uL — ABNORMAL HIGH (ref 4.0–10.5)
nRBC: 33 /100 WBC — ABNORMAL HIGH

## 2017-10-07 LAB — MAGNESIUM: Magnesium: 1.6 mg/dL — ABNORMAL LOW (ref 1.7–2.4)

## 2017-10-07 MED ORDER — ONDANSETRON HCL 4 MG/2ML IJ SOLN
4.0000 mg | Freq: Four times a day (QID) | INTRAMUSCULAR | Status: DC | PRN
  Administered 2017-10-08 – 2017-10-09 (×2): 4 mg via INTRAVENOUS
  Filled 2017-10-07 (×2): qty 2

## 2017-10-07 MED ORDER — SODIUM CHLORIDE 0.9% FLUSH: 9.0000 mL | INTRAVENOUS | Status: DC | PRN

## 2017-10-07 MED ORDER — POTASSIUM CHLORIDE 2 MEQ/ML IV SOLN: INTRAVENOUS | Status: DC

## 2017-10-07 MED ORDER — KCL IN DEXTROSE-NACL 40-5-0.45 MEQ/L-%-% IV SOLN
INTRAVENOUS | Status: DC
  Administered 2017-10-07: 19:00:00 via INTRAVENOUS
  Filled 2017-10-07: qty 1000

## 2017-10-07 MED ORDER — NALOXONE HCL 0.4 MG/ML IJ SOLN
0.4000 mg | INTRAMUSCULAR | Status: DC | PRN
Start: 2017-10-07 — End: 2017-10-11

## 2017-10-07 MED ORDER — DIPHENHYDRAMINE HCL 25 MG PO CAPS
25.0000 mg | ORAL_CAPSULE | ORAL | Status: DC | PRN
  Administered 2017-10-09 – 2017-10-10 (×2): 25 mg via ORAL
  Filled 2017-10-07 (×2): qty 1

## 2017-10-07 MED ORDER — HYDROMORPHONE HCL 2 MG PO TABS
3.0000 mg | ORAL_TABLET | ORAL | Status: DC
  Administered 2017-10-07 – 2017-10-11 (×25): 3 mg via ORAL
  Filled 2017-10-07 (×26): qty 2

## 2017-10-07 MED ORDER — HYDROMORPHONE 1 MG/ML IV SOLN
INTRAVENOUS | Status: DC
  Administered 2017-10-07: 18:00:00 via INTRAVENOUS
  Administered 2017-10-08: 3.37 mg via INTRAVENOUS
  Administered 2017-10-08: 3.5 mg via INTRAVENOUS
  Administered 2017-10-08: 5 mg via INTRAVENOUS
  Administered 2017-10-08: 16:00:00 via INTRAVENOUS
  Administered 2017-10-09: 2.5 mg via INTRAVENOUS
  Administered 2017-10-09: 10.5 mg via INTRAVENOUS
  Administered 2017-10-09: 6.5 mg via INTRAVENOUS
  Administered 2017-10-09: 13:00:00 via INTRAVENOUS
  Administered 2017-10-10: 8.5 mg via INTRAVENOUS
  Administered 2017-10-10: 08:00:00 via INTRAVENOUS
  Administered 2017-10-10: 2.5 mg via INTRAVENOUS
  Administered 2017-10-11: 7 mg via INTRAVENOUS
  Administered 2017-10-11: 04:00:00 via INTRAVENOUS
  Administered 2017-10-11: 5 mg via INTRAVENOUS
  Filled 2017-10-07 (×5): qty 25

## 2017-10-07 NOTE — Progress Notes (Signed)
Attempted to walk patient twice (at 0800 and 1030) but patient refused; RN explained to patient that we needed to see her oxygen saturation while ambulating but patient still refused and said" I am not feeling well enough to walk now".

## 2017-10-07 NOTE — Progress Notes (Signed)
[facility] PROGRESS NOTE  Krisi Azua CHY:[id number] DOB: 04-27-1991 DOA: 10/01/2017 PCP: Patient, No Pcp Per  Assessment/Plan: Principal Problem:   Sickle cell anemia with crisis ([place]) Active Problems:   Chest pain   1. Hb SS with Crisis: I saw patient earlier today and she felt that she could manage her pain at home with her oral analgesics. So I stopped the PCA to evaluate pain control and her pain escalated from 4/10 to 6/10. I will resume PCA and continue oral Dilaudid. Give some IVF as patient is having episodes of emesis. Continue Toradol.  2. Emesis:Etiology unsure. Will get a KUB and check CMET and Magnesium.  3. Constipation:Resolved with Lactulose.  4. Mild Hypokalemia: replace in IV.  5. Anemia of Chronic DIsease: Hb improved to 7 g/dL today.. Continue  Hydrea.  6. Leukocytosis: Leukocytosis persists without an obvious sign of infection.    Code Status: Full Code Family Communication: N/A Disposition Plan: Not yet ready for discharge  [place].  Pager [phone]. If 7PM-7AM, please contact night-coverage.  10/07/2017, 5:19 PM  LOS: 5 days   Interim History: Pt reports that the pain is localized only to her sternum at an intensity of 6/10.  Pt has been having loss of bowel control since taking the lactulose yesterday. She had 2 episodes of emesis today.   Consultants:  None  Procedures:  None  Antibiotics:  None   Objective: Vitals:   10/07/17 1154 10/07/17 1357 10/07/17 1716 10/07/17 1717  BP:  125/74 128/81   Pulse:  81 (!) 101 91  Resp: 18 18 20    Temp:  98.8 F (37.1 C)    TempSrc:  Oral Oral   SpO2: 98% 96% 100%   Weight:      Height:       Weight change:   Intake/Output Summary (Last 24 hours) at 10/07/2017 1719 Last data filed at 10/07/2017 1500 Gross per 24 hour  Intake 705 ml  Output -  Net 705 ml     Physical Exam General: Alert, awake, oriented x3, in mild distress due to pain.  HEENT: [place]/AT PEERL, EOMI,  mild icterus.  Neck: Trachea midline,  no masses, no thyromegal,y no JVD, no carotid bruit OROPHARYNX:  Moist, No exudate/ erythema/lesions.  Heart: Regular rate and rhythm, without murmurs, rubs, gallops, PMI non-displaced, no heaves or thrills on palpation.  Lungs: Clear to auscultation, no wheezing or rhonchi noted. No increased vocal fremitus resonant to percussion  Abdomen: Soft, nontender, nondistended, positive bowel sounds, no masses no hepatosplenomegaly noted.  Neuro: No focal neurological deficits noted cranial nerves II through XII grossly intact.  Strength at baseline in bilateral upper and lower extremities. Musculoskeletal: No warmth swelling or erythema around joints, no spinal tenderness noted. Psychiatric: Patient alert and oriented x3, good insight and cognition, good recent to remote recall.     Data Reviewed: Basic Metabolic Panel: Recent Labs  Lab 10/01/17 2118 10/02/17 0607 10/03/17 1256 10/05/17 0609 10/07/17 0543  NA 142 136 138 141 139  K 3.8 5.1 3.7 4.3 3.3*  CL 108 106 107 109 103  CO2 26 24 23 22 25   GLUCOSE 93 131* 96 84 89  BUN 5* 6 <5* 5* <5*  CREATININE <0.30* 0.33* <0.30* <0.30* 0.39*  CALCIUM 8.9 8.3* 8.1* 8.3* 8.6*  MG  --   --   --   --  1.6*   Liver Function Tests: Recent Labs  Lab 10/01/17 2118  AST 52*  ALT 17  ALKPHOS 80  BILITOT 5.1*  PROT 7.8  ALBUMIN 4.4   No results for input(s): LIPASE, AMYLASE in the last 168 hours. No results for input(s): AMMONIA in the last 168 hours. CBC: Recent Labs  Lab 10/01/17 2118 10/02/17 0607 10/03/17 1256 10/05/17 0609 10/07/17 0543  WBC 9.8 10.3 9.2 11.3* 14.0*  NEUTROABS 4.1 3.6 3.3 6.0 10.2*  HGB 8.3* 7.7* 6.8* 6.1* 7.0*  HCT 23.3* 21.2* 18.7* 16.1* 18.8*  MCV 101.7* 100.5* 99.5 99.4 105.6*  PLT 381 PLATELET CLUMPS NOTED ON SMEAR, COUNT APPEARS ADEQUATE 336 279 340   Cardiac Enzymes: Recent Labs  Lab 10/02/17 0607  TROPONINI <0.03   BNP (last 3 results) No results for  input(s): BNP in the last 8760 hours.  ProBNP (last 3 results) No results for input(s): PROBNP in the last 8760 hours.  CBG: No results for input(s): GLUCAP in the last 168 hours.  Recent Results (from the past 240 hour(s))  Respiratory Panel by PCR     Status: None   Collection Time: 10/02/17  8:15 AM  Result Value Ref Range Status   Adenovirus NOT DETECTED NOT DETECTED Final   Coronavirus 229E NOT DETECTED NOT DETECTED Final   Coronavirus HKU1 NOT DETECTED NOT DETECTED Final   Coronavirus NL63 NOT DETECTED NOT DETECTED Final   Coronavirus OC43 NOT DETECTED NOT DETECTED Final   Metapneumovirus NOT DETECTED NOT DETECTED Final   Rhinovirus / Enterovirus NOT DETECTED NOT DETECTED Final   Influenza A NOT DETECTED NOT DETECTED Final   Influenza B NOT DETECTED NOT DETECTED Final   Parainfluenza Virus 1 NOT DETECTED NOT DETECTED Final   Parainfluenza Virus 2 NOT DETECTED NOT DETECTED Final   Parainfluenza Virus 3 NOT DETECTED NOT DETECTED Final   Parainfluenza Virus 4 NOT DETECTED NOT DETECTED Final   Respiratory Syncytial Virus NOT DETECTED NOT DETECTED Final   Bordetella pertussis NOT DETECTED NOT DETECTED Final   Chlamydophila pneumoniae NOT DETECTED NOT DETECTED Final   Mycoplasma pneumoniae NOT DETECTED NOT DETECTED Final    Comment: Performed at [facility], [address], [place], [place] [address]     Studies: Dg Chest 2 View  Result Date: 10/01/2017 CLINICAL DATA:  Chest pain, history of sickle cell EXAM: CHEST  2 VIEW COMPARISON:  07/31/2017 FINDINGS: Mild cardiomegaly without aortic atherosclerosis. No acute pneumonic consolidation, CHF, effusion or pneumothorax. No acute osseous abnormality. IMPRESSION: Stable mild cardiomegaly.  No active pulmonary disease. Electronically Signed   By: Ashley Royalty M.D.   On: 10/01/2017 20:59    Scheduled Meds: . enoxaparin (LOVENOX) injection  40 mg Subcutaneous Q24H  . folic acid  1 mg Oral QHS  . HYDROmorphone   Intravenous  Q4H  . HYDROmorphone  3 mg Oral Q4H  . hydroxyurea  1,000 mg Oral QHS  . prochlorperazine  5 mg Intravenous Once  . senna-docusate  1 tablet Oral BID   Continuous Infusions: . sodium chloride 10 mL/hr at 10/01/17 2236  . dextrose 5 % and 0.45% NaCl 10 mL/hr at 10/04/17 1202    Principal Problem:   Sickle cell anemia with crisis [facility]) Active Problems:   Chest pain    In excess of 35 minutes spent during this visit. Greater than 50% involved face to face contact with the patient for assessment, counseling and coordination of care.

## 2017-10-07 NOTE — Progress Notes (Signed)
Wasted 84ml dilaudid PCA in sink  with witness Cytogeneticist at [phone]

## 2017-10-08 ENCOUNTER — Inpatient Hospital Stay (HOSPITAL_COMMUNITY): Payer: Self-pay

## 2017-10-08 DIAGNOSIS — R1110 Vomiting, unspecified: Secondary | ICD-10-CM

## 2017-10-08 DIAGNOSIS — R1115 Cyclical vomiting syndrome unrelated to migraine: Secondary | ICD-10-CM

## 2017-10-08 LAB — RETICULOCYTES
RBC.: 1.75 MIL/uL — ABNORMAL LOW (ref 3.87–5.11)
RETIC CT PCT: 19.4 % — AB (ref 0.4–3.1)
Retic Count, Absolute: 339.5 10*3/uL — ABNORMAL HIGH (ref 19.0–186.0)

## 2017-10-08 LAB — CBC WITH DIFFERENTIAL/PLATELET
Basophils Absolute: 0 10*3/uL (ref 0.0–0.1)
Basophils Relative: 1 %
EOS PCT: 5 %
Eosinophils Absolute: 0.5 10*3/uL (ref 0.0–0.7)
HCT: 18.6 % — ABNORMAL LOW (ref 36.0–46.0)
Hemoglobin: 6.9 g/dL — CL (ref 12.0–15.0)
Lymphocytes Relative: 33 %
Lymphs Abs: 2.8 10*3/uL (ref 0.7–4.0)
MCH: 39.4 pg — ABNORMAL HIGH (ref 26.0–34.0)
MCHC: 37.1 g/dL — AB (ref 30.0–36.0)
MCV: 106.3 fL — ABNORMAL HIGH (ref 78.0–100.0)
MONOS PCT: 10 %
Monocytes Absolute: 0.9 10*3/uL (ref 0.1–1.0)
NEUTROS PCT: 51 %
Neutro Abs: 4.4 10*3/uL (ref 1.7–7.7)
PLATELETS: 316 10*3/uL (ref 150–400)
RBC: 1.75 MIL/uL — ABNORMAL LOW (ref 3.87–5.11)
RDW: 22.2 % — ABNORMAL HIGH (ref 11.5–15.5)
WBC: 8.6 10*3/uL (ref 4.0–10.5)

## 2017-10-08 LAB — COMPREHENSIVE METABOLIC PANEL
ALBUMIN: 3.5 g/dL (ref 3.5–5.0)
ALT: 44 U/L (ref 14–54)
AST: 42 U/L — ABNORMAL HIGH (ref 15–41)
Alkaline Phosphatase: 111 U/L (ref 38–126)
Anion gap: 10 (ref 5–15)
BUN: <5 mg/dL — ABNORMAL LOW (ref 6–20)
CO2: 27 mmol/L (ref 22–32)
CREATININE: 0.32 mg/dL — AB (ref 0.44–1.00)
Calcium: 8.5 mg/dL — ABNORMAL LOW (ref 8.9–10.3)
Chloride: 104 mmol/L (ref 101–111)
GFR calc Af Amer: >60 mL/min (ref >60)
GFR calc non Af Amer: >60 mL/min (ref >60)
GLUCOSE: 90 mg/dL (ref 65–99)
Potassium: 3.7 mmol/L (ref 3.5–5.1)
SODIUM: 141 mmol/L (ref 135–145)
Total Bilirubin: 4.4 mg/dL — ABNORMAL HIGH (ref 0.3–1.2)
Total Protein: 6.7 g/dL (ref 6.5–8.1)

## 2017-10-08 MED ORDER — ACETAMINOPHEN 500 MG PO TABS: 1000.0000 mg | ORAL_TABLET | ORAL | Status: DC | PRN

## 2017-10-08 MED ORDER — ACETAMINOPHEN 500 MG PO TABS
1000.0000 mg | ORAL_TABLET | Freq: Once | ORAL | Status: AC
  Administered 2017-10-08: 1000 mg via ORAL
  Filled 2017-10-08: qty 2

## 2017-10-08 MED ORDER — ACETAMINOPHEN 500 MG PO TABS: 1000.0000 mg | ORAL_TABLET | Freq: Four times a day (QID) | ORAL | Status: DC | PRN

## 2017-10-08 NOTE — Progress Notes (Signed)
Patient ID: Gabriella Griffin, female   DOB: October 23, 1990, 27 y.o.   MRN: [id number] Subjective:  Patient is complaining of severe headache this morning, she is tearful, saying she is scare she may have a stroke because she does not usually have headaches as part of her crisis. She denies any fever, no blurry vision, no double vision. No N/V/D.   Objective:  Vital signs in last 24 hours:  Vitals:   10/08/17 0400 10/08/17 0523 10/08/17 0818 10/08/17 0824  BP:  116/73 122/72   Pulse:  74 69   Resp: 15 14 (!) 21 16  Temp:  98.8 F (37.1 C) 98.7 F (37.1 C)   TempSrc:  Oral Oral   SpO2: 98% 100% 97% 92%  Weight:      Height:        Intake/Output from previous day:   Intake/Output Summary (Last 24 hours) at 10/08/2017 1028 Last data filed at 10/08/2017 0300 Gross per 24 hour  Intake 1330 ml  Output -  Net 1330 ml    Physical Exam: General: Alert, awake, oriented x3, in no acute distress.  HEENT: [place]/AT PEERL, EOMI Neck: Trachea midline,  no masses, no thyromegal,y no JVD, no carotid bruit OROPHARYNX:  Moist, No exudate/ erythema/lesions.  Heart: Regular rate and rhythm, without murmurs, rubs, gallops, PMI non-displaced, no heaves or thrills on palpation.  Lungs: Clear to auscultation, no wheezing or rhonchi noted. No increased vocal fremitus resonant to percussion  Abdomen: Soft, nontender, nondistended, positive bowel sounds, no masses no hepatosplenomegaly noted..  Neuro: No focal neurological deficits noted cranial nerves II through XII grossly intact. DTRs 2+ bilaterally upper and lower extremities. Strength 5 out of 5 in bilateral upper and lower extremities. Musculoskeletal: No warm swelling or erythema around joints, no spinal tenderness noted. Psychiatric: Patient alert and oriented x3, good insight and cognition, good recent to remote recall. Lymph node survey: No cervical axillary or inguinal lymphadenopathy noted.  Lab Results:  Basic Metabolic Panel:    Component  Value Date/Time   NA 141 10/08/2017 0612   K 3.7 10/08/2017 0612   CL 104 10/08/2017 0612   CO2 27 10/08/2017 0612   BUN <5 (L) 10/08/2017 0612   CREATININE 0.32 (L) 10/08/2017 0612   GLUCOSE 90 10/08/2017 0612   CALCIUM 8.5 (L) 10/08/2017 0612   CBC:    Component Value Date/Time   WBC PENDING 10/08/2017 0612   HGB 6.9 (LL) 10/08/2017 0612   HCT 18.6 (L) 10/08/2017 0612   PLT PENDING 10/08/2017 0612   MCV 106.3 (H) 10/08/2017 0612   NEUTROABS PENDING 10/08/2017 0612   LYMPHSABS PENDING 10/08/2017 0612   MONOABS PENDING 10/08/2017 0612   EOSABS PENDING 10/08/2017 0612   BASOSABS PENDING 10/08/2017 0612    Recent Results (from the past 240 hour(s))  Respiratory Panel by PCR     Status: None   Collection Time: 10/02/17  8:15 AM  Result Value Ref Range Status   Adenovirus NOT DETECTED NOT DETECTED Final   Coronavirus 229E NOT DETECTED NOT DETECTED Final   Coronavirus HKU1 NOT DETECTED NOT DETECTED Final   Coronavirus NL63 NOT DETECTED NOT DETECTED Final   Coronavirus OC43 NOT DETECTED NOT DETECTED Final   Metapneumovirus NOT DETECTED NOT DETECTED Final   Rhinovirus / Enterovirus NOT DETECTED NOT DETECTED Final   Influenza A NOT DETECTED NOT DETECTED Final   Influenza B NOT DETECTED NOT DETECTED Final   Parainfluenza Virus 1 NOT DETECTED NOT DETECTED Final   Parainfluenza Virus 2 NOT DETECTED  NOT DETECTED Final   Parainfluenza Virus 3 NOT DETECTED NOT DETECTED Final   Parainfluenza Virus 4 NOT DETECTED NOT DETECTED Final   Respiratory Syncytial Virus NOT DETECTED NOT DETECTED Final   Bordetella pertussis NOT DETECTED NOT DETECTED Final   Chlamydophila pneumoniae NOT DETECTED NOT DETECTED Final   Mycoplasma pneumoniae NOT DETECTED NOT DETECTED Final    Comment: Performed at [facility], [place] [address], [place], [place] [address]    Studies/Results: Dg Abd 1 View  Result Date: 10/07/2017 CLINICAL DATA:  Sickle-cell crisis. Persistent nausea and vomiting x1 week.  EXAM: ABDOMEN - 1 VIEW COMPARISON:  None. FINDINGS: Nonobstructed, nondistended bowel gas pattern. Cholecystectomy clips are seen in the right upper quadrant. No radio-opaque calculi. 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 IMPRESSION: Unremarkable bowel gas pattern. Aspherical appearance of the femoral heads bilaterally. 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 Electronically Signed   By: Ashley Royalty M.D.   On: 10/07/2017 18:50    Medications: Scheduled Meds: . enoxaparin (LOVENOX) injection  40 mg Subcutaneous Q24H  . folic acid  1 mg Oral QHS  . HYDROmorphone   Intravenous Q4H  . HYDROmorphone  3 mg Oral Q4H  . hydroxyurea  1,000 mg Oral QHS  . prochlorperazine  5 mg Intravenous Once  . senna-docusate  1 tablet Oral BID   Continuous Infusions: . sodium chloride 10 mL/hr at 10/01/17 2236  . dextrose 5 % and 0.45% NaCl 10 mL/hr at 10/04/17 1202   PRN Meds:.diphenhydrAMINE, ipratropium-albuterol, naloxone **AND** sodium chloride flush, ondansetron (ZOFRAN) IV, polyethylene glycol  Assessment/Plan: Principal Problem:   Sickle cell anemia with crisis [facility]) Active Problems:   Chest pain  1. Hb SS with Crisis: Continue Dilaudid via PCA and continue oral Dilaudid. Resume IVF at 125 cc/hr as patient is having episodes of emesis. Continue Toradol. Continue other adjunct therapies.  2. Severe Headache: Unusual, new symptom, patient concerned: Will order a head CT. Give Tylenol now and prn for headache 3. Constipation:Resolved with Lactulose.  4. Anemia of Chronic DIsease: Hb is 6.9 g/dL today. No symptom. Continue  Hydrea.  5. Leukocytosis: Leukocytosis persists without an obvious sign of infection.   Code Status: Full Code Family Communication: N/A Disposition Plan: Not yet ready for discharge  Lakisha Peyser  If 7PM-7AM, please contact night-coverage.  10/08/2017, 10:28 AM  LOS: 6 days

## 2017-10-09 LAB — CBC WITH DIFFERENTIAL/PLATELET
BASOS ABS: 0 10*3/uL (ref 0.0–0.1)
BASOS PCT: 0 %
Eosinophils Absolute: 0.3 10*3/uL (ref 0.0–0.7)
Eosinophils Relative: 5 %
HCT: 18.6 % — ABNORMAL LOW (ref 36.0–46.0)
HEMOGLOBIN: 6.7 g/dL — AB (ref 12.0–15.0)
LYMPHS PCT: 34 %
Lymphs Abs: 2.2 10*3/uL (ref 0.7–4.0)
MCH: 38.5 pg — ABNORMAL HIGH (ref 26.0–34.0)
MCHC: 36 g/dL (ref 30.0–36.0)
MCV: 106.9 fL — AB (ref 78.0–100.0)
MONOS PCT: 8 %
Monocytes Absolute: 0.5 10*3/uL (ref 0.1–1.0)
NEUTROS PCT: 53 %
Neutro Abs: 3.6 10*3/uL (ref 1.7–7.7)
Platelets: 307 10*3/uL (ref 150–400)
RBC: 1.74 MIL/uL — ABNORMAL LOW (ref 3.87–5.11)
RDW: 22.5 % — ABNORMAL HIGH (ref 11.5–15.5)
WBC: 6.6 10*3/uL (ref 4.0–10.5)
nRBC: 44 /100 WBC — ABNORMAL HIGH

## 2017-10-09 LAB — COMPREHENSIVE METABOLIC PANEL
ALK PHOS: 99 U/L (ref 38–126)
ALT: 39 U/L (ref 14–54)
AST: 45 U/L — AB (ref 15–41)
Albumin: 3.3 g/dL — ABNORMAL LOW (ref 3.5–5.0)
Anion gap: 11 (ref 5–15)
CHLORIDE: 105 mmol/L (ref 101–111)
CO2: 28 mmol/L (ref 22–32)
CREATININE: 0.32 mg/dL — AB (ref 0.44–1.00)
Calcium: 8.2 mg/dL — ABNORMAL LOW (ref 8.9–10.3)
GFR calc Af Amer: >60 mL/min (ref >60)
Glucose, Bld: 105 mg/dL — ABNORMAL HIGH (ref 65–99)
Potassium: 3.4 mmol/L — ABNORMAL LOW (ref 3.5–5.1)
SODIUM: 144 mmol/L (ref 135–145)
Total Bilirubin: 3.7 mg/dL — ABNORMAL HIGH (ref 0.3–1.2)
Total Protein: 6.4 g/dL — ABNORMAL LOW (ref 6.5–8.1)
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
## 2017-10-10 DIAGNOSIS — F419 Anxiety disorder, unspecified: Secondary | ICD-10-CM

## 2017-10-10 DIAGNOSIS — F332 Major depressive disorder, recurrent severe without psychotic features: Secondary | ICD-10-CM

## 2017-10-10 DIAGNOSIS — Z79899 Other long term (current) drug therapy: Secondary | ICD-10-CM

## 2017-10-10 DIAGNOSIS — Z62810 Personal history of physical and sexual abuse in childhood: Secondary | ICD-10-CM

## 2017-10-10 LAB — CBC WITH DIFFERENTIAL/PLATELET
Basophils Absolute: 0.1 10*3/uL (ref 0.0–0.1)
Basophils Relative: 1 %
Eosinophils Absolute: 0.6 10*3/uL (ref 0.0–0.7)
Eosinophils Relative: 9 %
HCT: 18.5 % — ABNORMAL LOW (ref 36.0–46.0)
Hemoglobin: 6.6 g/dL — CL (ref 12.0–15.0)
LYMPHS ABS: 4 10*3/uL (ref 0.7–4.0)
LYMPHS PCT: 56 %
MCH: 38.2 pg — ABNORMAL HIGH (ref 26.0–34.0)
MCHC: 35.7 g/dL (ref 30.0–36.0)
MCV: 106.9 fL — AB (ref 78.0–100.0)
MONOS PCT: 10 %
Monocytes Absolute: 0.7 10*3/uL (ref 0.1–1.0)
Neutro Abs: 1.7 10*3/uL (ref 1.7–7.7)
Neutrophils Relative %: 24 %
Platelets: 294 10*3/uL (ref 150–400)
RBC: 1.73 MIL/uL — AB (ref 3.87–5.11)
RDW: 21.8 % — AB (ref 11.5–15.5)
WBC: 7.1 10*3/uL (ref 4.0–10.5)
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
## 2017-10-10 NOTE — Progress Notes (Signed)
Wasted 31ml Dilaudid PCA in sink with RN [place] at [phone]; syring replaced.

## 2017-10-10 NOTE — Progress Notes (Signed)
PHARMACY BRIEF NOTE: HYDROXYUREA   By [facility] policy, hydroxyurea is automatically held when any of the following laboratory values occur:  ANC < 2 K  Pltc < 80K in sickle-cell patients; < 100K in other patients  Hgb <= 6 in sickle-cell patients; < 8 in other patients  Reticulocytes < 80K when Hgb < 9  Hydroxyurea has been held (discontinued from profile) per policy.    Dolly Rias RPh 10/10/2017, 8:36 AM Pager [phone]

## 2017-10-10 NOTE — Progress Notes (Signed)
Attempted to walk patient in hall at 1015; patient refused. Will try later

## 2017-10-11 DIAGNOSIS — F332 Major depressive disorder, recurrent severe without psychotic features: Secondary | ICD-10-CM

## 2017-10-11 MED ORDER — ESCITALOPRAM OXALATE 5 MG PO TABS: 5.0000 mg | ORAL_TABLET | Freq: Every day | ORAL | 0 refills | Status: AC

## 2017-10-11 MED ORDER — ESCITALOPRAM OXALATE 10 MG PO TABS
5.0000 mg | ORAL_TABLET | Freq: Every day | ORAL | Status: DC
  Administered 2017-10-11: 5 mg via ORAL
  Filled 2017-10-11: qty 1
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
## 2017-10-11 NOTE — Progress Notes (Signed)
Subjective: Patient doing much better today. Pain is down to 6 out of 10. She is depressed was loss of social issues. No fever or chills no nausea vomiting or diarrhea.  Objective: Vital signs in last 24 hours: Temp:  [98.3 F (36.8 C)-98.8 F (37.1 C)] 98.3 F (36.8 C) (02/25 0454) Pulse Rate:  [63-77] 63 (02/26 0454) Resp:  [14-22] 15 (02/26 0454) BP: (110-139)/(62-82) 111/62 (02/26 0454) SpO2:  [95 %-100 %] 98 % (02/26 0454) Weight change:  Last BM Date: 10/08/17  Intake/Output from previous day: 02/24 0701 - 02/25 0700 In: 693.8 [P.O.:480; I.V.:213.8] Out: -  Intake/Output this shift: No intake/output data recorded.  General appearance: alert, cooperative, appears stated age and no distress Neck: no adenopathy, no carotid bruit, no JVD, supple, symmetrical, trachea midline and thyroid not enlarged, symmetric, no tenderness/mass/nodules Back: symmetric, no curvature. ROM normal. No CVA tenderness. Cardio: regular rate and rhythm, S1, S2 normal, no murmur, click, rub or gallop GI: soft, non-tender; bowel sounds normal; no masses,  no organomegaly Pulses: 2+ and symmetric Lymph nodes: Cervical, supraclavicular, and axillary nodes normal. Neurologic: Grossly normal  Lab Results: Recent Labs    10/09/17 0616 10/10/17 0621  WBC 6.6 7.1  HGB 6.7* 6.6*  HCT 18.6* 18.5*  PLT 307 294   BMET Recent Labs    10/09/17 0616  NA 144  K 3.4*  CL 105  CO2 28  GLUCOSE 105*  BUN <5*  CREATININE 0.32*  CALCIUM 8.2*    Studies/Results: No results found.  Medications: I have reviewed the patient's current medications.  Assessment/Plan: A 27 year old female here with sickle cell crisis  #1 sickle cell painful crisis: Patient appears to be doing better. I will continue current treatment unchanged. She still on Dilaudid PCA with oral medications  #2 severe depression: Psychiatric consultation pending. We will follow recommendations.  #3 sickle cell anemia: Appears to  have stabilized. H&H is at 6.6 which is baseline  #4 hypokalemia: Replete potassium.   LOS: 8 days   GARBA,LAWAL 10/10/2017, 7:38 AM

## 2017-10-11 NOTE — Progress Notes (Signed)
Went over d/c instructions with patient.  She verbalized understanding.  Left hospital with boyfriend, AVS and all belongings.

## 2017-10-11 NOTE — Progress Notes (Signed)
LCSW consulted foe psychosocial needs.   Patient admitted for sickle cell pain crisis.   LCSW attempted to meet with patient. Patient sleeping and did not awake to knock or sound of LCSW voice.   Patient seen by psych depression addressed. Patient psychiatrically cleared.   LCSW signing off. No CSW needs at this time.

## 2017-11-23 ENCOUNTER — Other Ambulatory Visit: Payer: Self-pay

## 2017-11-23 ENCOUNTER — Inpatient Hospital Stay (HOSPITAL_COMMUNITY)
Admission: EM | Admit: 2017-11-23 | Discharge: 2017-11-26 | DRG: 812 | Disposition: A | Discharge status: Home / Self Care | Payer: Self-pay | Attending: Internal Medicine | Admitting: Internal Medicine

## 2017-11-23 ENCOUNTER — Encounter (HOSPITAL_COMMUNITY): Payer: Self-pay | Admitting: Emergency Medicine

## 2017-11-23 ENCOUNTER — Emergency Department (HOSPITAL_COMMUNITY): Payer: Self-pay

## 2017-11-23 DIAGNOSIS — D5700 Hb-SS disease with crisis, unspecified: Principal | ICD-10-CM | POA: Diagnosis present

## 2017-11-23 DIAGNOSIS — E860 Dehydration: Secondary | ICD-10-CM | POA: Diagnosis present

## 2017-11-23 DIAGNOSIS — Z79899 Other long term (current) drug therapy: Secondary | ICD-10-CM

## 2017-11-23 DIAGNOSIS — Z91120 Patient's intentional underdosing of medication regimen due to financial hardship: Secondary | ICD-10-CM

## 2017-11-23 DIAGNOSIS — F329 Major depressive disorder, single episode, unspecified: Secondary | ICD-10-CM | POA: Diagnosis present

## 2017-11-23 DIAGNOSIS — Z832 Family history of diseases of the blood and blood-forming organs and certain disorders involving the immune mechanism: Secondary | ICD-10-CM

## 2017-11-23 DIAGNOSIS — T43206A Underdosing of unspecified antidepressants, initial encounter: Secondary | ICD-10-CM | POA: Diagnosis present

## 2017-11-23 LAB — URINALYSIS, ROUTINE W REFLEX MICROSCOPIC
Bilirubin Urine: NEGATIVE
Glucose, UA: NEGATIVE mg/dL
Ketones, ur: NEGATIVE mg/dL
Leukocytes, UA: NEGATIVE
Nitrite: NEGATIVE
PH: 6 (ref 5.0–8.0)
Protein, ur: NEGATIVE mg/dL
Specific Gravity, Urine: 1.011 (ref 1.005–1.030)

## 2017-11-23 LAB — CBC WITH DIFFERENTIAL/PLATELET
BASOS ABS: 0 10*3/uL (ref 0.0–0.1)
Basophils Relative: 1 %
Eosinophils Absolute: 0.1 10*3/uL (ref 0.0–0.7)
Eosinophils Relative: 1 %
HCT: 23.8 % — ABNORMAL LOW (ref 36.0–46.0)
HEMOGLOBIN: 8.5 g/dL — AB (ref 12.0–15.0)
Lymphocytes Relative: 27 %
Lymphs Abs: 2.2 10*3/uL (ref 0.7–4.0)
MCH: 37.9 pg — ABNORMAL HIGH (ref 26.0–34.0)
MCHC: 35.7 g/dL (ref 30.0–36.0)
MCV: 106.3 fL — ABNORMAL HIGH (ref 78.0–100.0)
MONO ABS: 0.9 10*3/uL (ref 0.1–1.0)
Monocytes Relative: 11 %
NEUTROS ABS: 5.1 10*3/uL (ref 1.7–7.7)
NEUTROS PCT: 60 %
Platelets: 408 10*3/uL — ABNORMAL HIGH (ref 150–400)
RBC: 2.24 MIL/uL — ABNORMAL LOW (ref 3.87–5.11)
RDW: 19.1 % — ABNORMAL HIGH (ref 11.5–15.5)
WBC: 8.3 10*3/uL (ref 4.0–10.5)

## 2017-11-23 LAB — COMPREHENSIVE METABOLIC PANEL
ALBUMIN: 4.3 g/dL (ref 3.5–5.0)
ALT: 15 U/L (ref 14–54)
ANION GAP: 8 (ref 5–15)
AST: 48 U/L — ABNORMAL HIGH (ref 15–41)
Alkaline Phosphatase: 66 U/L (ref 38–126)
BILIRUBIN TOTAL: 6.2 mg/dL — AB (ref 0.3–1.2)
BUN: 9 mg/dL (ref 6–20)
CO2: 24 mmol/L (ref 22–32)
Calcium: 9 mg/dL (ref 8.9–10.3)
Chloride: 112 mmol/L — ABNORMAL HIGH (ref 101–111)
Creatinine, Ser: 0.39 mg/dL — ABNORMAL LOW (ref 0.44–1.00)
GFR calc Af Amer: >60 mL/min (ref >60)
GFR calc non Af Amer: >60 mL/min (ref >60)
GLUCOSE: 113 mg/dL — AB (ref 65–99)
POTASSIUM: 4 mmol/L (ref 3.5–5.1)
SODIUM: 144 mmol/L (ref 135–145)
TOTAL PROTEIN: 7.9 g/dL (ref 6.5–8.1)

## 2017-11-23 LAB — RETICULOCYTES
RBC.: 2.24 MIL/uL — AB (ref 3.87–5.11)
Retic Ct Pct: >23 % — ABNORMAL HIGH (ref 0.4–3.1)

## 2017-11-23 LAB — I-STAT TROPONIN, ED: TROPONIN I, POC: 0 ng/mL (ref 0.00–0.08)

## 2017-11-23 LAB — HCG, QUANTITATIVE, PREGNANCY

## 2017-11-23 MED ORDER — DIPHENHYDRAMINE HCL 50 MG/ML IJ SOLN
25.0000 mg | INTRAMUSCULAR | Status: DC | PRN
  Filled 2017-11-23: qty 0.5

## 2017-11-23 MED ORDER — DEXTROSE-NACL 5-0.45 % IV SOLN
INTRAVENOUS | Status: DC
  Administered 2017-11-23: 05:00:00 via INTRAVENOUS

## 2017-11-23 MED ORDER — SODIUM CHLORIDE 0.9% FLUSH: 9.0000 mL | INTRAVENOUS | Status: DC | PRN

## 2017-11-23 MED ORDER — HYDROMORPHONE 1 MG/ML IV SOLN
INTRAVENOUS | Status: DC
  Administered 2017-11-23: 16:00:00 via INTRAVENOUS
  Administered 2017-11-23: 3 mg via INTRAVENOUS
  Administered 2017-11-24: 4.5 mg via INTRAVENOUS
  Administered 2017-11-24: 1 mg via INTRAVENOUS
  Administered 2017-11-24 (×2): 3 mg via INTRAVENOUS
  Administered 2017-11-24 (×2): 3.5 mg via INTRAVENOUS
  Administered 2017-11-24: 21:00:00 via INTRAVENOUS
  Administered 2017-11-24: 2 mg via INTRAVENOUS
  Administered 2017-11-25: 6.5 mg via INTRAVENOUS
  Administered 2017-11-25: 3.5 mg via INTRAVENOUS
  Administered 2017-11-25: 5.5 mg via INTRAVENOUS
  Administered 2017-11-25: 2 mg via INTRAVENOUS
  Administered 2017-11-25: 4 mg via INTRAVENOUS
  Administered 2017-11-26: 5.5 mg via INTRAVENOUS
  Administered 2017-11-26: 4.5 mg via INTRAVENOUS
  Administered 2017-11-26 (×2): 2.5 mg via INTRAVENOUS
  Filled 2017-11-23 (×3): qty 25

## 2017-11-23 MED ORDER — HYDROMORPHONE HCL 2 MG/ML IJ SOLN
2.0000 mg | INTRAMUSCULAR | Status: AC
  Administered 2017-11-23 (×3): 2 mg via INTRAVENOUS
  Filled 2017-11-23 (×3): qty 1

## 2017-11-23 MED ORDER — HYDROMORPHONE HCL 2 MG/ML IJ SOLN
2.0000 mg | Freq: Once | INTRAMUSCULAR | Status: AC
  Administered 2017-11-23: 2 mg via INTRAVENOUS
  Filled 2017-11-23: qty 1

## 2017-11-23 MED ORDER — ONDANSETRON HCL 4 MG/2ML IJ SOLN
4.0000 mg | INTRAMUSCULAR | Status: DC | PRN
  Administered 2017-11-23: 4 mg via INTRAVENOUS
  Filled 2017-11-23: qty 2

## 2017-11-23 MED ORDER — DEXTROSE-NACL 5-0.45 % IV SOLN
INTRAVENOUS | Status: DC
  Administered 2017-11-23: 125 mL/h via INTRAVENOUS
  Administered 2017-11-23 – 2017-11-26 (×7): via INTRAVENOUS

## 2017-11-23 MED ORDER — HYDROXYUREA 500 MG PO CAPS
1000.0000 mg | ORAL_CAPSULE | Freq: Every day | ORAL | Status: DC
  Administered 2017-11-23 – 2017-11-25 (×3): 1000 mg via ORAL
  Filled 2017-11-23 (×3): qty 2

## 2017-11-23 MED ORDER — ESCITALOPRAM OXALATE 10 MG PO TABS
5.0000 mg | ORAL_TABLET | Freq: Every day | ORAL | Status: DC
  Filled 2017-11-23 (×3): qty 1

## 2017-11-23 MED ORDER — FOLIC ACID 1 MG PO TABS
1.0000 mg | ORAL_TABLET | Freq: Every day | ORAL | Status: DC
  Administered 2017-11-23 – 2017-11-25 (×3): 1 mg via ORAL
  Filled 2017-11-23 (×3): qty 1

## 2017-11-23 MED ORDER — ONDANSETRON HCL 4 MG/2ML IJ SOLN
4.0000 mg | Freq: Four times a day (QID) | INTRAMUSCULAR | Status: DC | PRN
  Administered 2017-11-24 – 2017-11-26 (×3): 4 mg via INTRAVENOUS
  Filled 2017-11-23 (×3): qty 2

## 2017-11-23 MED ORDER — DIPHENHYDRAMINE HCL 25 MG PO CAPS: 25.0000 mg | ORAL_CAPSULE | ORAL | Status: DC | PRN

## 2017-11-23 MED ORDER — NALOXONE HCL 0.4 MG/ML IJ SOLN: 0.4000 mg | INTRAMUSCULAR | Status: DC | PRN

## 2017-11-23 MED ORDER — KETOROLAC TROMETHAMINE 30 MG/ML IJ SOLN: 30.0000 mg | Freq: Four times a day (QID) | INTRAMUSCULAR | Status: DC | PRN

## 2017-11-23 MED ORDER — ENOXAPARIN SODIUM 40 MG/0.4ML ~~LOC~~ SOLN
40.0000 mg | SUBCUTANEOUS | Status: DC
  Administered 2017-11-23 – 2017-11-25 (×3): 40 mg via SUBCUTANEOUS
  Filled 2017-11-23 (×3): qty 0.4

## 2017-11-23 MED ORDER — KETOROLAC TROMETHAMINE 30 MG/ML IJ SOLN
30.0000 mg | INTRAMUSCULAR | Status: AC
  Administered 2017-11-23: 30 mg via INTRAVENOUS
  Filled 2017-11-23: qty 1

## 2017-11-23 NOTE — ED Notes (Signed)
Pt provided warm blanket.

## 2017-11-23 NOTE — ED Notes (Signed)
Pt placed on 2L [place]

## 2017-11-23 NOTE — ED Triage Notes (Signed)
Patient arrives by [facility] with complaints of sickle cell crisis with back pain and bilateral leg pain and burning to right breast. Only taking Advil at home for pain with no relief.

## 2017-11-23 NOTE — Progress Notes (Signed)
   11/23/17 1600  Clinical Encounter Type  Visited With Patient;Health care provider  Visit Type Initial;Follow-up;Spiritual support;Social support  Spiritual Encounters  Spiritual Needs Emotional   Initial visit this inpatient.  Follow up from previous inpatient.  Patient expressed a gladness in seeing me.  We were able to pick up where we left off in talking about her relationship with her boyfriend.  Seems to connect with her condition.  Expresses desire to want a different path.  Patient has some support from sister and a co-worker.  Will follow and support as needed. Chaplain Katherene Ponto

## 2017-11-23 NOTE — ED Notes (Signed)
Pt provided crackers and peanut butter 

## 2017-11-23 NOTE — H&P (Signed)
H&P   Patient Demographics:    Gabriella Griffin, is a 27 y.o. female  MRN: [id number]   DOB - December 13, 1990  Admit Date - 11/23/2017  Outpatient Primary MD for the patient is Patient, No Pcp Per  Chief Complaint  Patient presents with  . Sickle Cell Pain Crisis  . Back Pain  . Bilateral leg pain     HPI:    Gabriella Griffin  is a 27 y.o. female with history of sickle cell disease who presents to the emergency department today for pain consistent with her sickle cell crisis. She reports central chest pain which has been constant over the past 3 days with intermittent, waxing and waning back, feels like a heartburn, she also has bilateral thigh pain. She describes the pain as burning and aching.  She has taken Advil at home with no relief. She reports that she typically takes Dilaudid tablets, but has been out of this medication for the past month. She tried calling her hematologist at [facility], but was unable to get a refill of this prescription. No associated fevers, shortness of breath, nausea, vomiting, abdominal pain.    Review of systems:    In addition to the HPI above, patient reports No fever or chills No Headache, No changes with vision or hearing No problems swallowing food or liquids No chest pain, cough or shortness of breath No Abdominal pain, No Nausea or Vomiting, Bowel movements are regular No blood in stool or urine No dysuria No new skin rashes or bruises No new joints pains-aches No new weakness, tingling, numbness in any extremity No recent weight gain or loss No polyuria, polydypsia or polyphagia No significant Mental Stressors  A full 10 point Review of Systems was done, except as stated above, all other Review of Systems were negative.  With Past History of the following :   Past Medical History:  Diagnosis Date  . Sickle cell anemia (HCC)   . Sickle cell disease ([place])       Past Surgical History:  Procedure Laterality Date  . CESAREAN SECTION  N/A 08/25/2015   Procedure: CESAREAN SECTION;  Surgeon: [name], MD;  Location: [facility];  Service: Obstetrics;  Laterality: N/A;  . CHOLECYSTECTOMY  2007     Social History:     Social History   Tobacco Use  . Smoking status: Never Smoker  . Smokeless tobacco: Never Used  Substance Use Topics  . Alcohol use: No    Lives - At home   Family History :   Family History  Problem Relation Age of Onset  . Sickle cell anemia Sister   . Diabetes Paternal Grandmother   . Sickle cell trait Mother   . Sickle cell trait Father     Home Medications:   Prior to Admission medications   Medication Sig Start Date End Date Taking? Authorizing Provider  folic acid (FOLVITE) 1 MG tablet Take 2 tablets (2 mg total) by mouth daily. Patient taking differently: Take 1 mg by mouth at bedtime.  06/16/15  Yes [name], MD  HYDROmorphone (DILAUDID) 2 MG tablet Take 1 tablet (2 mg total) by mouth every 4 (four) hours as needed for severe pain. Patient taking differently: Take 2 mg by mouth every 6 (six) hours as needed for severe pain.  08/27/15  Yes Woodroe Mode, MD  hydroxyurea (HYDREA) 500 MG capsule Take 1 capsule (500 mg total) by mouth daily. May take with food to minimize GI side effects. Patient taking  differently: Take 1,000 mg by mouth at bedtime. May take with food to minimize GI side effects. 08/29/15  Yes Seabron Spates, CNM  naproxen sodium (ALEVE) 220 MG tablet Take 220 mg by mouth daily as needed (pain).   Yes [provider]  escitalopram (LEXAPRO) 5 MG tablet Take 1 tablet (5 mg total) by mouth daily. Patient not taking: Reported on 11/23/2017 10/12/17   [name], MD     Allergies:     Allergies  Allergen Reactions  . Ceftin [Cefuroxime Axetil] Rash    Physical Exam:  Vitals:  Vitals:   11/23/17 1314 11/23/17 1330  BP: (!) 98/55 103/63  Pulse: 78 72  Resp: 18 17  Temp:    SpO2: 100% 100%   Physical Exam: Constitutional: Patient  appears well-developed and well-nourished. Not in obvious distress. HENT: Normocephalic, atraumatic, External right and left ear normal. Oropharynx is clear and moist.  Eyes: Conjunctivae and EOM are normal. PERRLA, no scleral icterus. Neck: Normal ROM. Neck supple. No JVD. No tracheal deviation. No thyromegaly. CVS: RRR, S1/S2 +, no murmurs, no gallops, no carotid bruit.  Pulmonary: Effort and breath sounds normal, no stridor, rhonchi, wheezes, rales.  Abdominal: Soft. BS +, no distension, tenderness, rebound or guarding.  Musculoskeletal: Normal range of motion. No edema and no tenderness.  Lymphadenopathy: No lymphadenopathy noted, cervical, inguinal or axillary Neuro: Alert. Normal reflexes, muscle tone coordination. No cranial nerve deficit. Skin: Skin is warm and dry. No rash noted. Not diaphoretic. No erythema. No pallor. Psychiatric: Normal mood and affect. Behavior, judgment, thought content normal.   Data Review:    CBC Recent Labs  Lab 11/23/17 0210  WBC 8.3  HGB 8.5*  HCT 23.8*  PLT 408*  MCV 106.3*  MCH 37.9*  MCHC 35.7  RDW 19.1*  LYMPHSABS 2.2  MONOABS 0.9  EOSABS 0.1  BASOSABS 0.0  ----------------------------------------------------------------------------------------------------------------- Chemistries  Recent Labs  Lab 11/23/17 0210  NA 144  K 4.0  CL 112*  CO2 24  GLUCOSE 113*  BUN 9  CREATININE 0.39*  CALCIUM 9.0  AST 48*  ALT 15  ALKPHOS 66  BILITOT 6.2*   ------------------------------------------------------------------------------------------------------------------ CrCl cannot be calculated (Unknown ideal weight.). ------------------------------------------------------------------------------------------------------------------ No results for input(s): TSH, T4TOTAL, T3FREE, THYROIDAB in the last 72 hours.  Invalid input(s): FREET3  Coagulation profile No results for input(s): INR, PROTIME in the last 168  hours. ------------------------------------------------------------------------------------------------------------------- No results for input(s): DDIMER in the last 72 hours. -------------------------------------------------------------------------------------------------------------------  Cardiac Enzymes No results for input(s): CKMB, TROPONINI, MYOGLOBIN in the last 168 hours.  Invalid input(s): CK ------------------------------------------------------------------------------------------------------------------ No results found for: BNP  ---------------------------------------------------------------------------------------------------------------  Urinalysis    Component Value Date/Time   COLORURINE YELLOW 11/23/2017 1240   APPEARANCEUR CLEAR 11/23/2017 1240   LABSPEC 1.011 11/23/2017 1240   PHURINE 6.0 11/23/2017 1240   GLUCOSEU NEGATIVE 11/23/2017 1240   HGBUR SMALL (A) 11/23/2017 1240   BILIRUBINUR NEGATIVE 11/23/2017 1240   KETONESUR NEGATIVE 11/23/2017 1240   PROTEINUR NEGATIVE 11/23/2017 1240   UROBILINOGEN 2.0 (H) 08/06/2015 1051   NITRITE NEGATIVE 11/23/2017 1240   LEUKOCYTESUR NEGATIVE 11/23/2017 1240    Imaging Results:    Dg Chest 2 View  Result Date: 11/23/2017 CLINICAL DATA:  Mid chest pain.  Sickle cell crisis. EXAM: CHEST - 2 VIEW COMPARISON:  10/01/2017 FINDINGS: Mild cardiomegaly.The mediastinal contours are normal. Minimal lingular scarring. No focal pulmonary opacity. Pulmonary vasculature is normal. No consolidation, pleural effusion, or pneumothorax. No acute osseous abnormalities are seen. IMPRESSION: Stable cardiomegaly without acute  abnormality. Electronically Signed   By: Jeb Levering M.D.   On: 11/23/2017 06:05    Assessment & Plan:    Active Problems:   Sickle cell anemia with crisis ([place])  1. Hb SS with Crisis: Admit patient to MedSurg Unit, start IV Fluid, weight-based IV Dilaudid via PCA and IV Toradol. Add other adjunct therapies per  sickle cell pain management protocol. 2. Anemia of Chronic DIsease: Hb is 8.5 today, better than her baseline. No indication for blood transfusion at the moment. 3. Major Depressive Disorder: Patient has significant social stressors especially domestic but she denies any major activity in this regards at the moment. She denies and suicidal ideation or thought.  DVT Prophylaxis: Subcut Lovenox   AM Labs Ordered, also please review Full Orders  Family Communication: Admission, patient's condition and plan of care including tests being ordered have been discussed with the patient who indicate understanding and agree with the plan and Code Status.  Code Status: Full Code  Consults called: None    Admission status: Inpatient    Time spent in minutes : 50 minutes  Angelica Chessman MD, MHA, CPE, FACP 11/23/2017 at 2:04 PM

## 2017-11-23 NOTE — ED Provider Notes (Signed)
[facility] DEPT Provider Note   CSN: [id number] Arrival date & time: 11/23/17  0131     History   Chief Complaint Chief Complaint  Patient presents with  . Sickle Cell Pain Crisis  . Back Pain  . Bilateral leg pain    HPI Gabriella Griffin is a 27 y.o. female.  27 year old female presents to the emergency department for pain consistent with sickle cell crisis.  She reports chest pain which has been constant over the past 3 days with intermittent, waxing and waning back and bilateral thigh pain.  She describes the pain as burning and aching, especially in her chest.  She has taken Advil at home with no relief.  She reports that she typically takes Dilaudid tablets, but has been out of this medication for the past month.  She tried calling her hematologist at [facility], but was unable to get a refill of this prescription.  No associated fevers, shortness of breath, nausea, vomiting, abdominal pain.  Patient endorses pain being consistent with past sickle cell crisis.     Past Medical History:  Diagnosis Date  . Sickle cell anemia (HCC)   . Sickle cell disease [facility])     Patient Active Problem List   Diagnosis Date Noted  . MDD (major depressive disorder), recurrent severe, without psychosis ([place])   . Emesis, persistent   . Sickle cell anemia with crisis ([place]) 10/02/2017  . Chest pain 10/02/2017  . Anemia due to acute blood loss 07/31/2017  . Constipation 07/18/2017  . Anemia of chronic disease 07/17/2017  . 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 Sickle cell pain crisis ([place]) 02/07/2015    Past Surgical History:  Procedure Laterality Date  . CESAREAN SECTION N/A 08/25/2015   Procedure: CESAREAN SECTION;  Surgeon: [name], MD;  Location: [facility];  Service: Obstetrics;  Laterality: N/A;  . CHOLECYSTECTOMY  2007     OB History    Gravida  2   Para  1   Term  1     Preterm      AB      Living  1     SAB      TAB      Ectopic      Multiple  0   Live Births  1            Home Medications    Prior to Admission medications   Medication Sig Start Date End Date Taking? Authorizing Provider  folic acid (FOLVITE) 1 MG tablet Take 2 tablets (2 mg total) by mouth daily. Patient taking differently: Take 1 mg by mouth at bedtime.  06/16/15  Yes [name], MD  HYDROmorphone (DILAUDID) 2 MG tablet Take 1 tablet (2 mg total) by mouth every 4 (four) hours as needed for severe pain. Patient taking differently: Take 2 mg by mouth every 6 (six) hours as needed for severe pain.  08/27/15  Yes Woodroe Mode, MD  hydroxyurea (HYDREA) 500 MG capsule Take 1 capsule (500 mg total) by mouth daily. May take with food to minimize GI side effects. Patient taking differently: Take 1,000 mg by mouth at bedtime. May take with food to minimize GI side effects. 08/29/15  Yes Seabron Spates, CNM  naproxen sodium (ALEVE) 220 MG tablet Take 220 mg by mouth daily as needed (pain).   Yes [provider]  escitalopram (LEXAPRO) 5 MG tablet Take 1 tablet (  5 mg total) by mouth daily. Patient not taking: Reported on 11/23/2017 10/12/17   [name], MD    Family History Family History  Problem Relation Age of Onset  . Sickle cell anemia Sister   . Diabetes Paternal Grandmother   . Sickle cell trait Mother   . Sickle cell trait Father     Social History Social History   Tobacco Use  . Smoking status: Never Smoker  . Smokeless tobacco: Never Used  Substance Use Topics  . Alcohol use: No  . Drug use: No     Allergies   Ceftin [cefuroxime axetil]   Review of Systems Review of Systems Ten systems reviewed and are negative for acute change, except as noted in the HPI.    Physical Exam Updated Vital Signs BP 111/61 (BP Location: Left Arm)   Pulse 88   Temp 98.1 F (36.7 C) (Oral)   Resp 19   LMP 11/06/2017   SpO2 94%    Physical Exam  Constitutional: She is oriented to person, place, and time. She appears well-developed and well-nourished. No distress.  Nontoxic appearing and in NAD  HENT:  Head: Normocephalic and atraumatic.  Eyes: Conjunctivae and EOM are normal. No scleral icterus.  Neck: Normal range of motion.  Cardiovascular: Normal rate, regular rhythm and intact distal pulses.  Murmur heard. Pulmonary/Chest: Effort normal. No stridor. No respiratory distress. She has no wheezes. She has no rales.  Lungs CTAB  Abdominal: Soft. She exhibits no distension. There is no tenderness. There is no guarding.  Soft, nontender abdomen   Musculoskeletal: Normal range of motion.  Neurological: She is alert and oriented to person, place, and time. She exhibits normal muscle tone. Coordination normal.  GCS 15. Moving all extremities spontaneously.  Skin: Skin is warm and dry. No rash noted. She is not diaphoretic. No erythema. No pallor.  Psychiatric: She has a normal mood and affect. Her behavior is normal.  Nursing note and vitals reviewed.    ED Treatments / Results  Labs (all labs ordered are listed, but only abnormal results are displayed) Labs Reviewed  COMPREHENSIVE METABOLIC PANEL - Abnormal; Notable for the following components:      Result Value   Chloride 112 (*)    Glucose, Bld 113 (*)    Creatinine, Ser 0.39 (*)    AST 48 (*)    Total Bilirubin 6.2 (*)    All other components within normal limits  CBC WITH DIFFERENTIAL/PLATELET - Abnormal; Notable for the following components:   RBC 2.24 (*)    Hemoglobin 8.5 (*)    HCT 23.8 (*)    MCV 106.3 (*)    MCH 37.9 (*)    RDW 19.1 (*)    Platelets 408 (*)    All other components within normal limits  RETICULOCYTES - Abnormal; Notable for the following components:   Retic Ct Pct >23.0 (*)    RBC. 2.24 (*)    All other components within normal limits  HCG, QUANTITATIVE, PREGNANCY  URINALYSIS, ROUTINE W REFLEX MICROSCOPIC  I-STAT  TROPONIN, ED    EKG None  Radiology Dg Chest 2 View  Result Date: 11/23/2017 CLINICAL DATA:  Mid chest pain.  Sickle cell crisis. EXAM: CHEST - 2 VIEW COMPARISON:  10/01/2017 FINDINGS: Mild cardiomegaly.The mediastinal contours are normal. Minimal lingular scarring. No focal pulmonary opacity. Pulmonary vasculature is normal. No consolidation, pleural effusion, or pneumothorax. No acute osseous abnormalities are seen. IMPRESSION: Stable cardiomegaly without acute abnormality. Electronically Signed  By: Jeb Levering M.D.   On: 11/23/2017 06:05    Procedures Procedures (including critical care time)  Medications Ordered in ED Medications  dextrose 5 %-0.45 % sodium chloride infusion ( Intravenous New Bag/Given 11/23/17 0443)  ondansetron (ZOFRAN) injection 4 mg (4 mg Intravenous Given 11/23/17 0443)  HYDROmorphone (DILAUDID) injection 2 mg (2 mg Intravenous Given 11/23/17 0550)  ketorolac (TORADOL) 30 MG/ML injection 30 mg (30 mg Intravenous Given 11/23/17 0443)     Initial Impression / Assessment and Plan / ED Course  I have reviewed the triage vital signs and the nursing notes.  Pertinent labs & imaging results that were available during my care of the patient were reviewed by me and considered in my medical decision making (see chart for details).     27 year old female presents to the emergency department for pain consistent with sickle cell crisis.  She is complaining of chest pain, but has no evidence of acute chest syndrome.  Chest x-ray is negative.  Vital signs have remained stable.  Laboratory workup appears at baseline.  Patient pending urinalysis, but has received Dilaudid and Toradol for pain control with continued discomfort.  She believes that she will require admission.  Dr. Florina Ou to discuss admission with [facility] attending.   Final Clinical Impressions(s) / ED Diagnoses   Final diagnoses:  Sickle cell pain crisis [facility])    ED Discharge Orders    None         Antonietta Breach, PA-C 11/23/17 [id number]    Shanon Rosser, MD 11/23/17 [phone]

## 2017-11-23 NOTE — ED Notes (Signed)
ED TO INPATIENT HANDOFF REPORT  Name/Age/Gender Gabriella Griffin 27 y.o. female  Code Status Code Status History    Date Active Date Inactive Code Status Order ID Comments User Context   10/02/2017 0308 10/11/2017 2239 Full Code [id number]  Norval Morton, MD ED   07/31/2017 1919 08/03/2017 2031 Full Code [id number]  Bennie Pierini, MD ED   07/17/2017 0742 07/20/2017 1851 Full Code [id number]  Rondel Jumbo, PA-C Inpatient   07/09/2017 2321 07/12/2017 1956 Full Code [id number]  [name], MD ED   06/25/2017 0308 06/29/2017 2021 Full Code [id number]  [name], MD ED   05/08/2017 2214 05/14/2017 2130 Full Code [id number]  [name], MD ED   03/26/2016 1652 03/30/2016 2006 Full Code [id number]  [name], MD ED   08/25/2015 0339 08/29/2015 1528 Full Code [id number]  [name], MD Inpatient   08/21/2015 1940 08/25/2015 0338 Full Code [id number]  [name], [place] Inpatient   08/14/2015 1332 08/21/2015 1940 Full Code [id number]  [name], MD Inpatient   07/13/2015 1834 07/18/2015 2039 Full Code [id number]  [name], MD Inpatient   06/11/2015 1210 06/16/2015 1600 Full Code [id number]  [name], MD Inpatient   02/07/2015 0251 02/09/2015 1448 Full Code [id number]  [name], MD Inpatient      Home/SNF/Other Home  Chief Complaint Sickle cell Crisis  Level of Care/Admitting Diagnosis ED Disposition    ED Disposition Condition [facility] Area: [facility] [[id number]]  Level of Care: Med-Surg [16]  Diagnosis: Sickle cell anemia with crisis [facility]) [[id number]]  Admitting Physician: Tresa Garter [[id number]]  Attending Physician: Tresa Garter [[id number]]  Estimated length of stay: past midnight tomorrow  Certification:: I certify this patient will need inpatient services for at least 2 midnights  Bed request comments: 3 [place]  PT Class (Do Not Modify): Inpatient [101]  PT Acc Code  (Do Not Modify): Private [1]       Medical History Past Medical History:  Diagnosis Date  . Sickle cell anemia (HCC)   . Sickle cell disease (HCC)     Allergies Allergies  Allergen Reactions  . Ceftin [Cefuroxime Axetil] Rash    IV Location/Drains/Wounds Patient Lines/Drains/Airways Status   Active Line/Drains/Airways    Name:   Placement date:   Placement time:   Site:   Days:   Peripheral IV 10/09/17 Left;Upper Arm   10/09/17    1311    Arm   45   Peripheral IV 11/23/17 Left;Medial Forearm   11/23/17    0210    Forearm   less than 1          Labs/Imaging Results for orders placed or performed during the hospital encounter of 11/23/17 (from the past 48 hour(s))  Comprehensive metabolic panel     Status: Abnormal   Collection Time: 11/23/17  2:10 AM  Result Value Ref Range   Sodium 144 135 - 145 mmol/L   Potassium 4.0 3.5 - 5.1 mmol/L   Chloride 112 (H) 101 - 111 mmol/L   CO2 24 22 - 32 mmol/L   Glucose, Bld 113 (H) 65 - 99 mg/dL   BUN 9 6 - 20 mg/dL   Creatinine, Ser 0.39 (L) 0.44 - 1.00 mg/dL   Calcium 9.0 8.9 - 10.3 mg/dL   Total Protein 7.9 6.5 - 8.1 g/dL   Albumin 4.3 3.5 - 5.0 g/dL   AST 48 (H) 15 - 41 U/L  ALT 15 14 - 54 U/L   Alkaline Phosphatase 66 38 - 126 U/L   Total Bilirubin 6.2 (H) 0.3 - 1.2 mg/dL   GFR calc non Af Amer >60 >60 mL/min   GFR calc Af Amer >60 >60 mL/min    Comment: (NOTE) The eGFR has been calculated using the CKD EPI equation. This calculation has not been validated in all clinical situations. eGFR's persistently <60 mL/min signify possible Chronic Kidney Disease.    Anion gap 8 5 - 15    Comment: Performed at [facility], [place] [address], [place], [place] [address]  CBC with Differential     Status: Abnormal   Collection Time: 11/23/17  2:10 AM  Result Value Ref Range   WBC 8.3 4.0 - 10.5 K/uL    Comment: WHITE COUNT CONFIRMED ON SMEAR   RBC 2.24 (L) 3.87 - 5.11 MIL/uL   Hemoglobin 8.5 (L) 12.0 - 15.0  g/dL   HCT 23.8 (L) 36.0 - 46.0 %   MCV 106.3 (H) 78.0 - 100.0 fL   MCH 37.9 (H) 26.0 - 34.0 pg   MCHC 35.7 30.0 - 36.0 g/dL   RDW 19.1 (H) 11.5 - 15.5 %   Platelets 408 (H) 150 - 400 K/uL    Comment: REPEATED TO VERIFY SPECIMEN CHECKED FOR CLOTS PLATELET COUNT CONFIRMED BY SMEAR    Neutrophils Relative % 60 %   Neutro Abs 5.1 1.7 - 7.7 K/uL   Lymphocytes Relative 27 %   Lymphs Abs 2.2 0.7 - 4.0 K/uL   Monocytes Relative 11 %   Monocytes Absolute 0.9 0.1 - 1.0 K/uL   Eosinophils Relative 1 %   Eosinophils Absolute 0.1 0.0 - 0.7 K/uL   Basophils Relative 1 %   Basophils Absolute 0.0 0.0 - 0.1 K/uL   RBC Morphology POLYCHROMASIA PRESENT     Comment: Sickle cells present TARGET CELLS HOWELL/JOLLY BODIES RARE NRBCs Performed at [facility], [place] [address] [place], [place] [address]   Reticulocytes     Status: Abnormal   Collection Time: 11/23/17  2:10 AM  Result Value Ref Range   Retic Ct Pct >23.0 (H) 0.4 - 3.1 %   RBC. 2.24 (L) 3.87 - 5.11 MIL/uL   Retic Count, Absolute NOT CALCULATED 19.0 - 186.0 K/uL    Comment: Performed at [facility], [place] [address], [place], [place] [address]  hCG, quantitative, pregnancy     Status: None   Collection Time: 11/23/17  2:10 AM  Result Value Ref Range   hCG, Beta Chain, Quant, S <1 <5 mIU/mL    Comment:          GEST. AGE      CONC.  (mIU/mL)   <=1 WEEK        5 - 50     2 WEEKS       50 - 500     3 WEEKS       100 - 10,000     4 WEEKS     1,000 - 30,000     5 WEEKS     3,500 - 115,000   6-8 WEEKS     12,000 - 270,000    12 WEEKS     15,000 - 220,000        FEMALE AND NON-PREGNANT FEMALE:     LESS THAN 5 mIU/mL Performed at [facility], [place] [address], [place], [place] [address]   I-stat troponin, ED  Status: None   Collection Time: 11/23/17  5:00 AM  Result Value Ref Range   Troponin i, poc 0.00 0.00 - 0.08 ng/mL   Comment 3            Comment: Due to the release  kinetics of cTnI, a negative result within the first hours of the onset of symptoms does not rule out myocardial infarction with certainty. If myocardial infarction is still suspected, repeat the test at appropriate intervals.   Urinalysis, Routine w reflex microscopic     Status: Abnormal   Collection Time: 11/23/17 12:40 PM  Result Value Ref Range   Color, Urine YELLOW YELLOW   APPearance CLEAR CLEAR   Specific Gravity, Urine 1.011 1.005 - 1.030   pH 6.0 5.0 - 8.0   Glucose, UA NEGATIVE NEGATIVE mg/dL   Hgb urine dipstick SMALL (A) NEGATIVE   Bilirubin Urine NEGATIVE NEGATIVE   Ketones, ur NEGATIVE NEGATIVE mg/dL   Protein, ur NEGATIVE NEGATIVE mg/dL   Nitrite NEGATIVE NEGATIVE   Leukocytes, UA NEGATIVE NEGATIVE   RBC / HPF 0-5 0 - 5 RBC/hpf   WBC, UA 0-5 0 - 5 WBC/hpf   Bacteria, UA RARE (A) NONE SEEN   Squamous Epithelial / LPF 0-5 (A) NONE SEEN   Mucus PRESENT     Comment: Performed at [facility], [place] [address], [place], [place] [address]   Dg Chest 2 View  Result Date: 11/23/2017 CLINICAL DATA:  Mid chest pain.  Sickle cell crisis. EXAM: CHEST - 2 VIEW COMPARISON:  10/01/2017 FINDINGS: Mild cardiomegaly.The mediastinal contours are normal. Minimal lingular scarring. No focal pulmonary opacity. Pulmonary vasculature is normal. No consolidation, pleural effusion, or pneumothorax. No acute osseous abnormalities are seen. IMPRESSION: Stable cardiomegaly without acute abnormality. Electronically Signed   By: Jeb Levering M.D.   On: 11/23/2017 06:05    Pending Labs Unresulted Labs (From admission, onward)   Start     Ordered   Signed and Held  Comprehensive metabolic panel  Tomorrow morning,   R     Signed and Held   Signed and Held  CBC  Tomorrow morning,   R     Signed and Held      Vitals/Pain Today's Vitals   11/23/17 1314 11/23/17 1315 11/23/17 1330 11/23/17 1400  BP: (!) 98/55  103/63 (!) 100/56  Pulse: 78  72 74  Resp: _0 Temp:       TempSrc:      SpO2: 100%  100% 100%  PainSc:  7       Isolation Precautions No active isolations  Medications Medications  dextrose 5 %-0.45 % sodium chloride infusion ( Intravenous New Bag/Given 11/23/17 0443)  ondansetron (ZOFRAN) injection 4 mg (4 mg Intravenous Given 11/23/17 0443)  ketorolac (TORADOL) 30 MG/ML injection 30 mg (30 mg Intravenous Given 11/23/17 0443)  HYDROmorphone (DILAUDID) injection 2 mg (2 mg Intravenous Given 11/23/17 0648)  HYDROmorphone (DILAUDID) injection 2 mg (2 mg Intravenous Given 11/23/17 1236)    Mobility walks

## 2017-11-24 DIAGNOSIS — D5700 Hb-SS disease with crisis, unspecified: Principal | ICD-10-CM

## 2017-11-24 LAB — COMPREHENSIVE METABOLIC PANEL
ALK PHOS: 57 U/L (ref 38–126)
ALT: 18 U/L (ref 14–54)
AST: 46 U/L — ABNORMAL HIGH (ref 15–41)
Albumin: 3.6 g/dL (ref 3.5–5.0)
Anion gap: 7 (ref 5–15)
BUN: 6 mg/dL (ref 6–20)
CALCIUM: 8.5 mg/dL — AB (ref 8.9–10.3)
CO2: 22 mmol/L (ref 22–32)
Chloride: 110 mmol/L (ref 101–111)
Glucose, Bld: 104 mg/dL — ABNORMAL HIGH (ref 65–99)
Potassium: 3.9 mmol/L (ref 3.5–5.1)
Sodium: 139 mmol/L (ref 135–145)
Total Bilirubin: 5 mg/dL — ABNORMAL HIGH (ref 0.3–1.2)
Total Protein: 6.6 g/dL (ref 6.5–8.1)

## 2017-11-24 LAB — CBC
HCT: 21.1 % — ABNORMAL LOW (ref 36.0–46.0)
Hemoglobin: 7.5 g/dL — ABNORMAL LOW (ref 12.0–15.0)
MCH: 37.5 pg — AB (ref 26.0–34.0)
MCHC: 35.5 g/dL (ref 30.0–36.0)
MCV: 105.5 fL — ABNORMAL HIGH (ref 78.0–100.0)
PLATELETS: 286 10*3/uL (ref 150–400)
RBC: 2 MIL/uL — AB (ref 3.87–5.11)
RDW: 18.1 % — ABNORMAL HIGH (ref 11.5–15.5)
WBC: 8.8 10*3/uL (ref 4.0–10.5)

## 2017-11-24 MED ORDER — ACETAMINOPHEN 325 MG PO TABS
650.0000 mg | ORAL_TABLET | Freq: Four times a day (QID) | ORAL | Status: DC | PRN
  Administered 2017-11-24: 650 mg via ORAL
  Filled 2017-11-24: qty 2

## 2017-11-24 NOTE — Progress Notes (Signed)
Subjective: A 27 year old female with known history of sickle cell disease admitted yesterday with sickle cell painful crisis. Patient still reports pain at 7 out of 10 in her back and lower legs. She is on Dilaudid PCA and Toradol. She has used 12 mg with 25 demands and 24 deliveries since admission.Denied any nausea vomiting or diarrhea.  Objective: Vital signs in last 24 hours: Temp:  [98 F (36.7 C)-98.9 F (37.2 C)] 98.6 F (37 C) (04/11 0549) Pulse Rate:  [69-92] 70 (04/11 0549) Resp:  [11-25] 17 (04/11 0549) BP: (95-126)/(55-96) 102/70 (04/11 0549) SpO2:  [88 %-100 %] 100 % (04/11 0549) Weight:  [53.5 kg (117 lb 14.4 oz)-53.7 kg (118 lb 6.2 oz)] 53.5 kg (117 lb 14.4 oz) (04/11 0549) Weight change:  Last BM Date: 11/22/17  Intake/Output from previous day: 04/10 0701 - 04/11 0700 In: 1612.5 [I.V.:1612.5] Out: 800 [Urine:800] Intake/Output this shift: No intake/output data recorded.  General appearance: alert, cooperative, appears stated age and no distress Neck: no adenopathy, no carotid bruit, no JVD, supple, symmetrical, trachea midline and thyroid not enlarged, symmetric, no tenderness/mass/nodules Back: symmetric, no curvature. ROM normal. No CVA tenderness. Resp: clear to auscultation bilaterally Cardio: regular rate and rhythm, S1, S2 normal, no murmur, click, rub or gallop GI: soft, non-tender; bowel sounds normal; no masses,  no organomegaly Extremities: extremities normal, atraumatic, no cyanosis or edema Pulses: 2+ and symmetric Skin: Skin color, texture, turgor normal. No rashes or lesions Neurologic: Grossly normal  Lab Results: Recent Labs    11/23/17 0210 11/24/17 0413  WBC 8.3 8.8  HGB 8.5* 7.5*  HCT 23.8* 21.1*  PLT 408* 286   BMET Recent Labs    11/23/17 0210 11/24/17 0413  NA 144 139  K 4.0 3.9  CL 112* 110  CO2 24 22  GLUCOSE 113* 104*  BUN 9 6  CREATININE 0.39* <0.30*  CALCIUM 9.0 8.5*    Studies/Results: Dg Chest 2 View  Result  Date: 11/23/2017 CLINICAL DATA:  Mid chest pain.  Sickle cell crisis. EXAM: CHEST - 2 VIEW COMPARISON:  10/01/2017 FINDINGS: Mild cardiomegaly.The mediastinal contours are normal. Minimal lingular scarring. No focal pulmonary opacity. Pulmonary vasculature is normal. No consolidation, pleural effusion, or pneumothorax. No acute osseous abnormalities are seen. IMPRESSION: Stable cardiomegaly without acute abnormality. Electronically Signed   By: Jeb Levering M.D.   On: 11/23/2017 06:05    Medications: I have reviewed the patient's current medications.  Assessment/Plan:  27 year old female admitted with sickle cell painful crisis.  #1 sickle cell painful crisis: Patient is responding to the Dilaudid PCA. We will continue current treatment. Transition to oral treatment prior to discharge. She has had multiple admissions so far this year. Discussed with patient ways to avoid inpatient hospitalizations.  #2 sickle cell anemia: Patient has chronic anemia and hemoglobin currently at baseline. Continue monitoring  #3 depression: Patient has had problem with severe depression. She has been referred to psychiatry during last hospitalization. She needs outpatient counseling to continue.  #4 dehydration: Continue hydration with D5 half-normal saline.    LOS: 1 day   GARBA,LAWAL 11/24/2017, 7:19 AM

## 2017-11-25 NOTE — Progress Notes (Signed)
Subjective: Patient has pain at 6/10 on Dilaudid PCa and Toradol. She has used 13 mg with 26 demands and 26 deliveries. No fever, no chills no NVD.   Objective: Vital signs in last 24 hours: Temp:  [98.4 F (36.9 C)-99 F (37.2 C)] 99 F (37.2 C) (04/12 1438) Pulse Rate:  [71-91] 91 (04/12 1438) Resp:  [12-21] 14 (04/12 1518) BP: (91-118)/(47-63) 118/49 (04/12 1438) SpO2:  [99 %-100 %] 100 % (04/12 1518) Weight:  [55 kg (121 lb 4.1 oz)] 55 kg (121 lb 4.1 oz) (04/12 1505) Weight change: 1.3 kg (2 lb 13.9 oz) Last BM Date: 11/22/17  Intake/Output from previous day: 04/11 0701 - 04/12 0700 In: 1080 [P.O.:1080] Out: 1800 [Urine:1800] Intake/Output this shift: Total I/O In: 200 [P.O.:200] Out: 800 [Urine:800]  General appearance: alert, cooperative, appears stated age and no distress Neck: no adenopathy, no carotid bruit, no JVD, supple, symmetrical, trachea midline and thyroid not enlarged, symmetric, no tenderness/mass/nodules Back: symmetric, no curvature. ROM normal. No CVA tenderness. Resp: clear to auscultation bilaterally Cardio: regular rate and rhythm, S1, S2 normal, no murmur, click, rub or gallop GI: soft, non-tender; bowel sounds normal; no masses,  no organomegaly Extremities: extremities normal, atraumatic, no cyanosis or edema Pulses: 2+ and symmetric Skin: Skin color, texture, turgor normal. No rashes or lesions Neurologic: Grossly normal  Lab Results: Recent Labs    11/23/17 0210 11/24/17 0413  WBC 8.3 8.8  HGB 8.5* 7.5*  HCT 23.8* 21.1*  PLT 408* 286   BMET Recent Labs    11/23/17 0210 11/24/17 0413  NA 144 139  K 4.0 3.9  CL 112* 110  CO2 24 22  GLUCOSE 113* 104*  BUN 9 6  CREATININE 0.39* <0.30*  CALCIUM 9.0 8.5*    Studies/Results: No results found.  Medications: I have reviewed the patient's current medications.  Assessment/Plan:  27 year old female admitted with sickle cell painful crisis.  #1 sickle cell painful crisis: Patient  doing better. She has been feeling better but still in pain. Continue Dilaudid PCA and Toradol and IVF.  #2 sickle cell anemia: Patient has chronic anemia and hemoglobin currently at baseline. Continue monitoring  #3 depression: Patient has had problem with severe depression. She has been referred to psychiatry during last hospitalization. She needs outpatient counseling to continue.  #4 dehydration: Continue hydration with D5 half-normal saline.    LOS: 2 days   Tetsuo Coppola,LAWAL 11/25/2017, 4:25 PM

## 2017-11-26 NOTE — Discharge Summary (Signed)
Physician Discharge Summary  Patient ID: Gabriella Griffin MRN: [id number] DOB/AGE: 01/12/1991 27 y.o.  Admit date: 11/23/2017 Discharge date: 11/26/2017  Admission Diagnoses:  Discharge Diagnoses:  Active Problems:   Sickle cell anemia with crisis [facility])   Discharged Condition: good  Hospital Course: Patient is  a 27 year old female admitted with sickle cell painful crisis. She has had recurrent hospitalizations due to sickle cell crisis.  She was treated with IV Dilaudid PCA with Toradol and IV fluids.  Patient also has history of depression but not on any medications due to cost.  She responded to treatment and was gradually transitioned to home regimen.  She is to follow-up with her PCP.  Antidepressants were also given in the hospital and patient encouraged to obtain them in the outpatient setting.  Consults: None  Significant Diagnostic Studies: labs: review of CBCs and CMP were checked.   Treatments: IV hydration and analgesia: acetaminophen and Dilaudid  Discharge Exam: Blood pressure (!) 93/53, pulse 70, temperature 98.9 F (37.2 C), temperature source Oral, resp. rate 12, height 5\' 2"  (1.575 m), weight 55 kg (121 lb 4.1 oz), last menstrual period 11/06/2017, SpO2 100 %, unknown if currently breastfeeding. General appearance: alert, cooperative and appears stated age Resp: clear to auscultation bilaterally Cardio: regular rate and rhythm, S1, S2 normal, no murmur, click, rub or gallop GI: soft, non-tender; bowel sounds normal; no masses,  no organomegaly Extremities: extremities normal, atraumatic, no cyanosis or edema  Disposition: Discharge disposition: 01-Home or Self Care       Discharge Instructions    Diet - low sodium heart healthy   Complete by:  As directed    Increase activity slowly   Complete by:  As directed      Allergies as of 11/26/2017      Reactions   Ceftin [cefuroxime Axetil] Rash      Medication List    TAKE these medications    escitalopram 5 MG tablet Commonly known as:  LEXAPRO Take 1 tablet (5 mg total) by mouth daily.   folic acid 1 MG tablet Commonly known as:  FOLVITE Take 2 tablets (2 mg total) by mouth daily. What changed:    how much to take  when to take this   HYDROmorphone 2 MG tablet Commonly known as:  DILAUDID Take 1 tablet (2 mg total) by mouth every 4 (four) hours as needed for severe pain. What changed:  when to take this   hydroxyurea 500 MG capsule Commonly known as:  HYDREA Take 1 capsule (500 mg total) by mouth daily. May take with food to minimize GI side effects. What changed:    how much to take  when to take this  additional instructions   naproxen sodium 220 MG tablet Commonly known as:  ALEVE Take 220 mg by mouth daily as needed (pain).        SignedBarbette Merino 11/26/2017, 11:36 AM   Time spent 32 minutes

## 2018-02-23 IMAGING — DX DG CHEST 2V
2 series · 2 of 2 positions shown · non-contrast
Comparison: 04/18/2017

CLINICAL DATA: Sickle cell crisis with fever.

EXAM:
CHEST  2 VIEW

[chest pa]
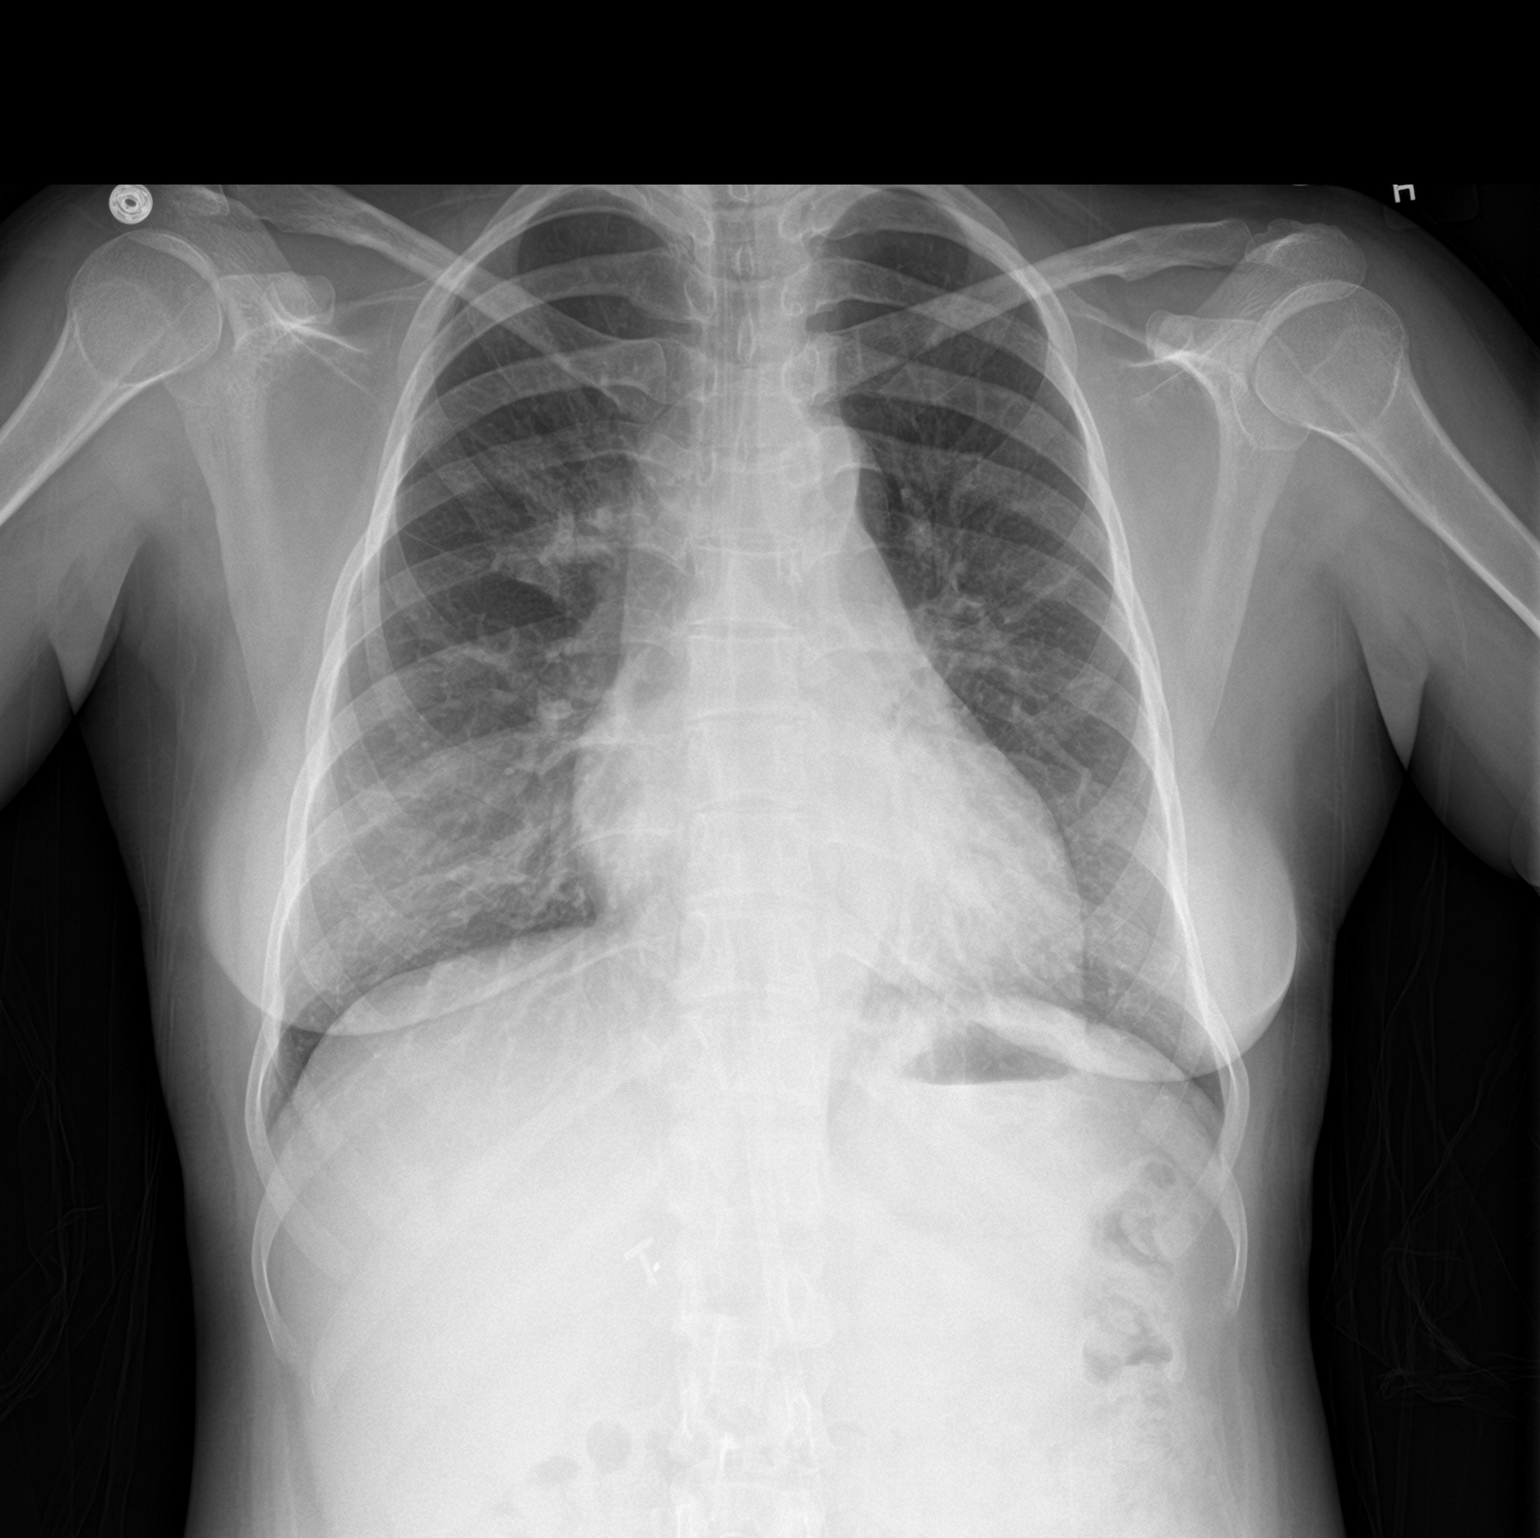

[chest lat]
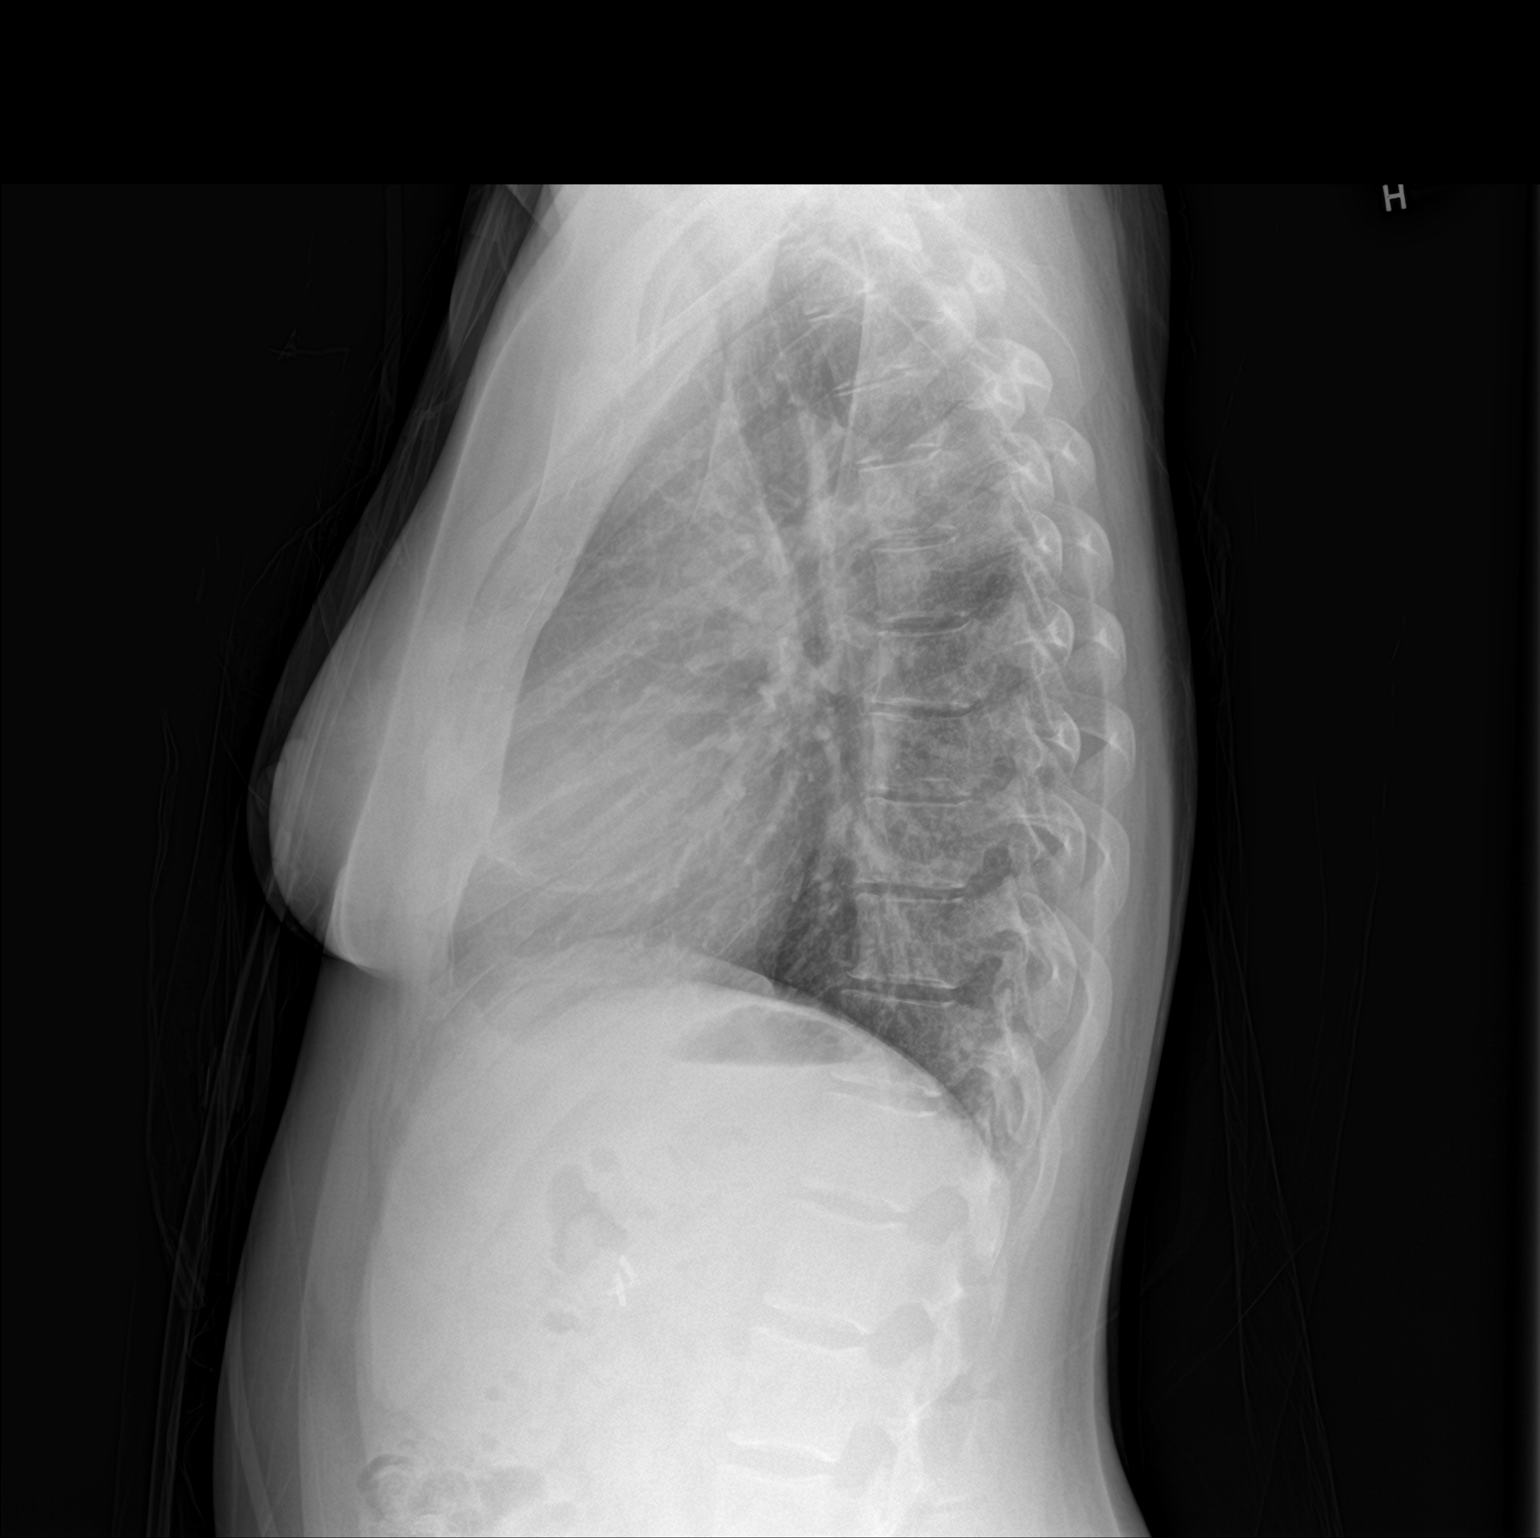

[2 of 2 positions shown; findings below may reference images not displayed]

FINDINGS: Lungs are adequately inflated without consolidation or effusion.
Subtle prominence of the perihilar markings with peribronchial
thickening. Cardiomediastinal silhouette, bones and soft tissues are
within normal.
IMPRESSION: Subtle prominence of the perihilar markings with peribronchial
thickening which may be due to mild vascular congestion versus acute
bronchitic process.

## 2023-10-28 ENCOUNTER — Ambulatory Visit: Payer: Self-pay | Admitting: Nurse Practitioner

## 2023-12-22 ENCOUNTER — Emergency Department (HOSPITAL_COMMUNITY)

## 2023-12-22 ENCOUNTER — Emergency Department (HOSPITAL_COMMUNITY)
Admission: EM | Admit: 2023-12-22 | Discharge: 2023-12-22 | Disposition: A | Discharge status: Home / Self Care | Attending: Emergency Medicine | Admitting: Emergency Medicine

## 2023-12-22 ENCOUNTER — Other Ambulatory Visit: Payer: Self-pay

## 2023-12-22 ENCOUNTER — Other Ambulatory Visit: Payer: Self-pay | Admitting: Nurse Practitioner

## 2023-12-22 ENCOUNTER — Encounter (HOSPITAL_COMMUNITY): Payer: Self-pay

## 2023-12-22 ENCOUNTER — Non-Acute Institutional Stay (HOSPITAL_COMMUNITY)
Admission: AD | Admit: 2023-12-22 | Discharge: 2023-12-22 | Disposition: A | Discharge status: Home / Self Care | Source: Ambulatory Visit | Attending: Internal Medicine | Admitting: Internal Medicine

## 2023-12-22 DIAGNOSIS — D5700 Hb-SS disease with crisis, unspecified: Secondary | ICD-10-CM | POA: Insufficient documentation

## 2023-12-22 DIAGNOSIS — D57219 Sickle-cell/Hb-C disease with crisis, unspecified: Secondary | ICD-10-CM | POA: Diagnosis present

## 2023-12-22 LAB — CBC WITH DIFFERENTIAL/PLATELET
Abs Immature Granulocytes: 0.01 10*3/uL (ref 0.00–0.07)
Basophils Absolute: 0 10*3/uL (ref 0.0–0.1)
Basophils Relative: 1 %
Eosinophils Absolute: 0.2 10*3/uL (ref 0.0–0.5)
Eosinophils Relative: 2 %
HCT: 22.4 % — ABNORMAL LOW (ref 36.0–46.0)
Hemoglobin: 7.9 g/dL — ABNORMAL LOW (ref 12.0–15.0)
Immature Granulocytes: 0 %
Lymphocytes Relative: 45 %
Lymphs Abs: 3.3 10*3/uL (ref 0.7–4.0)
MCH: 33.3 pg (ref 26.0–34.0)
MCHC: 35.3 g/dL (ref 30.0–36.0)
MCV: 94.5 fL (ref 80.0–100.0)
Monocytes Absolute: 0.8 10*3/uL (ref 0.1–1.0)
Monocytes Relative: 11 %
Neutro Abs: 2.9 10*3/uL (ref 1.7–7.7)
Neutrophils Relative %: 41 %
Platelets: 454 10*3/uL — ABNORMAL HIGH (ref 150–400)
RBC: 2.37 MIL/uL — ABNORMAL LOW (ref 3.87–5.11)
RDW: 26.6 % — ABNORMAL HIGH (ref 11.5–15.5)
WBC: 7.2 10*3/uL (ref 4.0–10.5)
nRBC: 2.1 % — ABNORMAL HIGH (ref 0.0–0.2)

## 2023-12-22 LAB — COMPREHENSIVE METABOLIC PANEL WITH GFR
ALT: 36 U/L (ref 0–44)
AST: 52 U/L — ABNORMAL HIGH (ref 15–41)
Albumin: 4.3 g/dL (ref 3.5–5.0)
Alkaline Phosphatase: 121 U/L (ref 38–126)
Anion gap: 6 (ref 5–15)
BUN: 15 mg/dL (ref 6–20)
CO2: 24 mmol/L (ref 22–32)
Calcium: 8.8 mg/dL — ABNORMAL LOW (ref 8.9–10.3)
Chloride: 109 mmol/L (ref 98–111)
Creatinine, Ser: 0.42 mg/dL — ABNORMAL LOW (ref 0.44–1.00)
GFR, Estimated: >60 mL/min (ref >60)
Glucose, Bld: 114 mg/dL — ABNORMAL HIGH (ref 70–99)
Potassium: 4 mmol/L (ref 3.5–5.1)
Sodium: 139 mmol/L (ref 135–145)
Total Bilirubin: 4.1 mg/dL — ABNORMAL HIGH (ref 0.0–1.2)
Total Protein: 7.5 g/dL (ref 6.5–8.1)

## 2023-12-22 LAB — RETICULOCYTES
Immature Retic Fract: 25.9 % — ABNORMAL HIGH (ref 2.3–15.9)
RBC.: 2.41 MIL/uL — ABNORMAL LOW (ref 3.87–5.11)
Retic Count, Absolute: 217.1 10*3/uL — ABNORMAL HIGH (ref 19.0–186.0)
Retic Ct Pct: 9 % — ABNORMAL HIGH (ref 0.4–3.1)

## 2023-12-22 LAB — HCG, SERUM, QUALITATIVE: Preg, Serum: NEGATIVE

## 2023-12-22 MED ORDER — POLYETHYLENE GLYCOL 3350 17 G PO PACK: 17.0000 g | PACK | Freq: Every day | ORAL | Status: DC | PRN

## 2023-12-22 MED ORDER — HYDROMORPHONE HCL 2 MG/ML IJ SOLN
2.0000 mg | INTRAMUSCULAR | Status: AC | PRN
  Administered 2023-12-22: 2 mg via INTRAVENOUS
  Filled 2023-12-22: qty 1

## 2023-12-22 MED ORDER — KETOROLAC TROMETHAMINE 30 MG/ML IJ SOLN
30.0000 mg | Freq: Once | INTRAMUSCULAR | Status: AC
  Administered 2023-12-22: 30 mg via INTRAVENOUS
  Filled 2023-12-22: qty 1

## 2023-12-22 MED ORDER — HYDROMORPHONE HCL 1 MG/ML IJ SOLN
1.0000 mg | Freq: Once | INTRAMUSCULAR | Status: AC
  Administered 2023-12-22: 1 mg via SUBCUTANEOUS
  Filled 2023-12-22: qty 1

## 2023-12-22 MED ORDER — ONDANSETRON HCL 4 MG/2ML IJ SOLN
4.0000 mg | Freq: Once | INTRAMUSCULAR | Status: AC
  Administered 2023-12-22: 4 mg via INTRAVENOUS
  Filled 2023-12-22: qty 2

## 2023-12-22 MED ORDER — HYDROMORPHONE HCL 1 MG/ML IJ SOLN
1.0000 mg | Freq: Once | INTRAMUSCULAR | Status: AC
  Administered 2023-12-22: 1 mg via INTRAVENOUS
  Filled 2023-12-22: qty 1

## 2023-12-22 MED ORDER — ACETAMINOPHEN 500 MG PO TABS
1000.0000 mg | ORAL_TABLET | Freq: Once | ORAL | Status: AC
  Administered 2023-12-22: 1000 mg via ORAL
  Filled 2023-12-22: qty 2

## 2023-12-22 MED ORDER — OXYCODONE HCL 5 MG PO CAPS: 10.0000 mg | ORAL_CAPSULE | Freq: Four times a day (QID) | ORAL | 0 refills | Status: DC | PRN

## 2023-12-22 MED ORDER — OXYCODONE HCL 5 MG PO TABS
10.0000 mg | ORAL_TABLET | Freq: Once | ORAL | Status: AC
  Administered 2023-12-22: 10 mg via ORAL
  Filled 2023-12-22: qty 2

## 2023-12-22 MED ORDER — OXYCODONE HCL 5 MG PO TABS: 10.0000 mg | ORAL_TABLET | Freq: Four times a day (QID) | ORAL | Status: DC | PRN

## 2023-12-22 MED ORDER — ACETAMINOPHEN 500 MG PO TABS: 1000.0000 mg | ORAL_TABLET | Freq: Once | ORAL | Status: DC

## 2023-12-22 MED ORDER — SENNOSIDES-DOCUSATE SODIUM 8.6-50 MG PO TABS: 1.0000 | ORAL_TABLET | Freq: Two times a day (BID) | ORAL | Status: DC

## 2023-12-22 MED ORDER — SODIUM CHLORIDE 0.45 % IV SOLN: INTRAVENOUS | Status: DC

## 2023-12-22 NOTE — Progress Notes (Signed)
 Patient admitted to the day hospital from [facility] for sickle cell pain. Initially, patient reported chest, lower back and thigh pain rated 7/10. For pain management, patient hydrated with IV fluids, given 2 mg Dilaudid  IV push x 1 and 10 mg Oxycodone  PO. At discharge, patient rated pain at 6/10. Vital signs stable. Discharge instructions given. Patient alert, oriented and ambulatory at discharge.

## 2023-12-22 NOTE — Discharge Summary (Signed)
 Physician Discharge Summary  [place] Boye WUJ:[id number] DOB: May 20, 1991 DOA: 12/22/2023  PCP: Patient, No Pcp Per  Admit date: 12/22/2023  Discharge date: 12/22/2023  Time spent: 30 minutes  Discharge Diagnoses:  Principal Problem:   Sickle-cell disease with pain [facility])   Discharge Condition: Stable  Diet recommendation: Regular  History of present illness:   Gabriella Griffin is a 33 y.o. female with history of sickle cell disease, who presented to the emergency room with sickle cell pain. Patient says symptoms have been ongoing for a few days. Generalize pain and bilateral lower extremity pain. Patient describes pain as throbbing and constant.  She has not had any fever or cough. Pain medication at home has not managed her painful symptoms. Patient typically gets her care at [facility], however she has recently moved to this area.   Hospital Course:  Gabriella Griffin was admitted to the day hospital with sickle cell painful crisis. After treatment failure in the emergency department. Patient was treated with IV fluid, IV Dilaudid , clinician assisted doses as deemed appropriate, and other adjunct therapies per sickle cell pain management protocol. Gabriella Griffin showed significant improvement symptomatically, pain improved from 9/10 to 6/10 at the time of discharge. Patient was discharged home in a hemodynamically stable condition. Gabriella Griffin will follow-up at the clinic as previously scheduled, continue with home medications as per prior to admission.  Discharge Instructions We discussed the need for good hydration, monitoring of hydration status, avoidance of heat, cold, stress, and infection triggers. We discussed the need to be compliant with taking other home medications. [name] was reminded of the need to seek medical attention immediately if any symptom of bleeding, anemia, or infection occurs. Patient is set up to establish care with a PCP next week at the patient care center.    Discharge Exam: Vitals:   12/22/23 1234 12/22/23 1431  BP: (!) 105/51 (!) 102/51  Pulse: 77 78  Resp: 16 16  Temp: 97.8 F (36.6 C)   SpO2: 98% 97%    General appearance: alert, cooperative and no distress Eyes: conjunctivae/corneas clear. PERRL, EOM's intact. Fundi benign. Neck: no adenopathy, no carotid bruit, no JVD, supple, symmetrical, trachea midline and thyroid not enlarged, symmetric, no tenderness/mass/nodules Back: symmetric, no curvature. ROM normal. No CVA tenderness. Resp: clear to auscultation bilaterally Chest wall: no tenderness Cardio: regular rate and rhythm, S1, S2 normal, no murmur, click, rub or gallop GI: soft, non-tender; bowel sounds normal; no masses,  no organomegaly Extremities: extremities normal, atraumatic, no cyanosis or edema Pulses: 2+ and symmetric Skin: Skin color, texture, turgor normal. No rashes or lesions Neurologic: Grossly normal  Discharge Instructions     Diet - low sodium heart healthy   Complete by: As directed    Increase activity slowly   Complete by: As directed       Allergies as of 12/22/2023       Reactions   Azithromycin  Rash   Ceftin [cefuroxime Axetil] Rash   Fentanyl  Rash        Medication List     TAKE these medications    escitalopram  5 MG tablet Commonly known as: LEXAPRO  Take 1 tablet (5 mg total) by mouth daily.   folic acid  1 MG tablet Commonly known as: FOLVITE  Take 2 tablets (2 mg total) by mouth daily. What changed:  how much to take when to take this   HYDROmorphone  2 MG tablet Commonly known as: DILAUDID  Take 1 tablet (2 mg total) by mouth every 4 (four) hours as needed  for severe pain.   hydroxyurea  500 MG capsule Commonly known as: HYDREA  Take 1 capsule (500 mg total) by mouth daily. May take with food to minimize GI side effects. What changed:  how much to take when to take this   naproxen  sodium 220 MG tablet Commonly known as: ALEVE  Take 220 mg by mouth daily as needed  (pain).   oxycodone  5 MG capsule Commonly known as: OXY-IR Take 20 mg by mouth every 4 (four) hours as needed for pain.       Allergies  Allergen Reactions   Azithromycin  Rash   Ceftin [Cefuroxime Axetil] Rash   Fentanyl  Rash     Significant Diagnostic Studies: DG Chest Portable 1 View Result Date: 12/22/2023 CLINICAL DATA:  acute chest pain. EXAM: PORTABLE CHEST 1 VIEW COMPARISON:  11/23/2017. FINDINGS: Bilateral lung fields are clear. Bilateral costophrenic angles are clear. Normal cardio-mediastinal silhouette. No acute osseous abnormalities. The soft tissues are within normal limits. IMPRESSION: *No active disease. Electronically Signed   By: Beula Brunswick M.D.   On: 12/22/2023 11:21    Signed:  Lorel Roes NP   12/22/2023, 3:20 PM

## 2023-12-22 NOTE — ED Triage Notes (Signed)
 Pt to er, pt states that she is here for sickle cell pain, states that she went to another doctor last night,/ this am and he sent her home, states that she is still having some chest pain.

## 2023-12-22 NOTE — H&P (Signed)
 [facility] History and Physical  Gabriella Griffin UEA:[id number] DOB: 27-Jun-1991 DOA: 12/22/2023  Gabriella Griffin   Chief Complaint:  Chief Complaint  Patient presents with   Sickle Cell Pain Crisis    HPI: Gabriella Griffin is a 33 y.o. female with history of sickle cell disease, who presented to the emergency room with sickle cell pain. Patient says symptoms have been ongoing for a few days. Generalize pain and bilateral lower extremity pain. Patient describes pain as throbbing and constant.  She has not had any fever or cough. Pain medication at home has not managed her painful symptoms. Patient typically gets her care at [facility], however she has recently moved to this area.   Systemic Review: General: The patient denies anorexia, fever, weight loss Cardiac: Denies chest pain, syncope, palpitations, pedal edema  Respiratory: Denies cough, shortness of breath, wheezing GI: Denies severe indigestion/heartburn, abdominal pain, nausea, vomiting, diarrhea and constipation GU: Denies hematuria, incontinence, dysuria  Musculoskeletal: Generalize body pain / bilateral lower extremity pain .  Skin: Denies suspicious skin lesions Neurologic: Denies focal weakness or numbness, change in vision  Past Medical History:  Diagnosis Date   Sickle cell anemia (HCC)    Sickle cell disease (HCC)     Past Surgical History:  Procedure Laterality Date   CESAREAN SECTION N/A 08/25/2015   Procedure: CESAREAN SECTION;  Surgeon: Gabriella Goad, MD;  Location: [facility];  Service: Obstetrics;  Laterality: N/A;   CHOLECYSTECTOMY  08/16/2005   TUBAL LIGATION      Allergies  Allergen Reactions   Azithromycin  Rash   Ceftin [Cefuroxime Axetil] Rash   Fentanyl  Rash    Family History  Problem Relation Age of Onset   Sickle cell anemia Sister    Diabetes Paternal Grandmother    Sickle cell trait Mother    Sickle cell trait Father       Prior to Admission  medications   Medication Sig Start Date End Date Taking? Authorizing Provider  escitalopram  (LEXAPRO ) 5 MG tablet Take 1 tablet (5 mg total) by mouth daily. Patient not taking: Reported on 11/23/2017 10/12/17   Gabriella Espy, MD  folic acid  (FOLVITE ) 1 MG tablet Take 2 tablets (2 mg total) by mouth daily. Patient taking differently: Take 1 mg by mouth at bedtime.  06/16/15   Gabriella Sauce, MD  HYDROmorphone  (DILAUDID ) 2 MG tablet Take 1 tablet (2 mg total) by mouth every 4 (four) hours as needed for severe pain. Patient taking differently: Take 2 mg by mouth every 6 (six) hours as needed for severe pain.  08/27/15   Gabriella Fruit, MD  hydroxyurea  (HYDREA ) 500 MG capsule Take 1 capsule (500 mg total) by mouth daily. May take with food to minimize GI side effects. Patient taking differently: Take 1,000 mg by mouth at bedtime. May take with food to minimize GI side effects. 08/29/15   Gabriella Griffin, CNM  naproxen  sodium (ALEVE ) 220 MG tablet Take 220 mg by mouth daily as needed (pain).    [provider]     Physical Exam: Vitals:   12/22/23 1234  BP: (!) 105/51  Pulse: 77  Resp: 16  Temp: 97.8 F (36.6 C)  TempSrc: Temporal  SpO2: 98%    General: Alert, awake, afebrile, anicteric, not in obvious distress HEENT: Normocephalic and Atraumatic, Mucous membranes pink                PERRLA; EOM intact; No scleral icterus,  Nares: Patent, Oropharynx: Clear, Fair Dentition                 Neck: FROM, no cervical lymphadenopathy, thyromegaly, carotid bruit or JVD;  CHEST WALL: No tenderness  CHEST: Normal respiration, clear to auscultation bilaterally  HEART: Regular rate and rhythm; no murmurs rubs or gallops  BACK: No kyphosis or scoliosis; no CVA tenderness  ABDOMEN: Positive Bowel Sounds, soft, non-tender; no masses, no organomegaly EXTREMITIES: No cyanosis, clubbing, or edema SKIN:  no rash or ulceration  CNS: Alert and Oriented x 4, Nonfocal exam,  CN 2-12 intact  Labs on Admission:  Basic Metabolic Panel: Recent Labs  Lab 12/22/23 1019  NA 139  K 4.0  CL 109  CO2 24  GLUCOSE 114*  BUN 15  CREATININE 0.42*  CALCIUM 8.8*   Liver Function Tests: Recent Labs  Lab 12/22/23 1019  AST 52*  ALT 36  ALKPHOS 121  BILITOT 4.1*  PROT 7.5  ALBUMIN 4.3   No results for input(s): "LIPASE", "AMYLASE" in the last 168 hours. No results for input(s): "AMMONIA" in the last 168 hours. CBC: Recent Labs  Lab 12/22/23 1019  WBC 7.2  NEUTROABS 2.9  HGB 7.9*  HCT 22.4*  MCV 94.5  PLT 454*   Cardiac Enzymes: No results for input(s): "CKTOTAL", "CKMB", "CKMBINDEX", "TROPONINI" in the last 168 hours.  BNP (last 3 results) No results for input(s): "BNP" in the last 8760 hours.  ProBNP (last 3 results) No results for input(s): "PROBNP" in the last 8760 hours.  CBG: No results for input(s): "GLUCAP" in the last 168 hours.   Assessment/Plan Principal Problem:   Sickle-cell disease with pain (HCC)  [facility] IVF 0.45% Saline @ 125 mls/hour Weight based IV Dilaudid  within 30 minutes of admission IV Toradol  15 mg X 1 (completed in the ED) Acetaminophen  1000 mg x 1 dose ( completed in the ED) Labs: CBCD, CMP, Retic Count and LDH ( Labs completed in ED prior to [facility] admission ) Monitor vitals very closely, Re-evaluate pain scale every hour 2 L of Oxygen by [place] Patient will be re-evaluated for pain in the context of function and relationship to baseline as care progresses. If no significant relieve from pain (remains above 5/10) will transfer patient to inpatient services for further evaluation and management  Code Status: Full  Family Communication: None  DVT Prophylaxis: Ambulate as tolerated   Time spent: 35 Minutes  Lorel Roes NP   If 7PM-7AM, please contact night-coverage [email or web address] 12/22/2023, 1:51 PM

## 2023-12-22 NOTE — ED Provider Notes (Signed)
 [place] EMERGENCY DEPARTMENT AT [facility] Provider Note  CSN: [id number] Arrival date & time: 12/22/23 0957  Chief Complaint(s) Sickle Cell Pain Crisis  HPI Gabriella Griffin is a 33 y.o. female with history of sickle cell disease who is here today for which she reports has sickle cell pain.  Patient says symptoms have been ongoing for a few days.  She has not had any fever or cough.  Patient been taking her home medications without any relief.  Typically gets her care at [facility], however she has recently moved to the area.   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 Authorizing Provider  escitalopram  (LEXAPRO ) 5 MG tablet Take 1 tablet (5 mg total) by mouth daily. Patient not taking: Reported on 11/23/2017 10/12/17   Davida Espy, MD  folic acid  (FOLVITE ) 1 MG tablet Take 2 tablets (2 mg total) by mouth daily. Patient taking differently: Take 1 mg by mouth at bedtime.  06/16/15   Lynnetta Sauce, MD  HYDROmorphone  (DILAUDID ) 2 MG tablet Take 1 tablet (2 mg total) by mouth every 4 (four) hours as needed for severe pain. Patient taking differently: Take 2 mg by mouth every 6 (six) hours as needed for severe pain.  08/27/15   Tresia Fruit, MD  hydroxyurea  (HYDREA ) 500 MG capsule Take 1 capsule (500 mg  total) by mouth daily. May take with food to minimize GI side effects. Patient taking differently: Take 1,000 mg by mouth at bedtime. May take with food to minimize GI side effects. 08/29/15   Harlee Lichtenstein, CNM  naproxen  sodium (ALEVE ) 220 MG tablet Take 220 mg by mouth daily as needed (pain).    [provider]                                                                                                                                    Past Surgical History Past Surgical History:  Procedure Laterality Date   CESAREAN SECTION N/A 08/25/2015   Procedure: CESAREAN SECTION;  Surgeon: Verlyn Goad, MD;  Location: [facility];  Service: Obstetrics;  Laterality: N/A;   CHOLECYSTECTOMY  08/16/2005   TUBAL LIGATION     Family History Family History  Problem Relation  Age of Onset   Sickle cell anemia Sister    Diabetes Paternal Grandmother    Sickle cell trait Mother    Sickle cell trait Father     Social History Social History   Tobacco Use   Smoking status: Never   Smokeless tobacco: Never  Vaping Use   Vaping status: Never Used  Substance Use Topics   Alcohol use: No   Drug use: No   Allergies Azithromycin , Ceftin [cefuroxime axetil], and Fentanyl   Review of Systems Review of Systems  Physical Exam Vital Signs  I have reviewed the triage vital signs BP (!) 126/58 (BP Location: Left Arm)   Pulse 95   Temp 99.3 F (37.4 C) (Oral)   Resp 16   Ht 5\' 2"  (1.575 m)   Wt 50.3 kg   SpO2 99%   BMI 20.30 kg/m   Physical Exam Vitals reviewed.  Constitutional:      Appearance: She is not toxic-appearing.  Cardiovascular:     Rate and Rhythm: Normal rate.     Pulses: Normal pulses.  Pulmonary:     Effort: Pulmonary effort is normal.     Breath sounds: Normal breath sounds.  Neurological:     Mental Status: She is alert.     ED Results and Treatments Labs (all labs ordered are listed, but only abnormal results are displayed) Labs Reviewed   COMPREHENSIVE METABOLIC PANEL WITH GFR - Abnormal; Notable for the following components:      Result Value   Glucose, Bld 114 (*)    Creatinine, Ser 0.42 (*)    Calcium 8.8 (*)    AST 52 (*)    Total Bilirubin 4.1 (*)    All other components within normal limits  CBC WITH DIFFERENTIAL/PLATELET - Abnormal; Notable for the following components:   RBC 2.37 (*)    Hemoglobin 7.9 (*)    HCT 22.4 (*)    RDW 26.6 (*)    Platelets 454 (*)    nRBC 2.1 (*)    All other components within normal limits  RETICULOCYTES - Abnormal; Notable for the following components:   Retic Ct Pct 9.0 (*)    RBC. 2.41 (*)    Retic Count, Absolute 217.1 (*)    Immature Retic Fract 25.9 (*)    All other components within normal limits  HCG, SERUM, QUALITATIVE                                                                                                                          Radiology DG Chest Portable 1 View Result Date: 12/22/2023 CLINICAL DATA:  acute chest pain. EXAM: PORTABLE CHEST 1 VIEW COMPARISON:  11/23/2017. FINDINGS: Bilateral lung fields are clear. Bilateral costophrenic angles are clear. Normal cardio-mediastinal silhouette. No acute osseous abnormalities. The soft tissues are within normal limits. IMPRESSION: *No active disease. Electronically Signed   By: Beula Brunswick M.D.   On: 12/22/2023 11:21    Pertinent labs & imaging results that  were available during my care of the patient were reviewed by me and considered in my medical decision making (see MDM for details).  Medications Ordered in ED Medications  HYDROmorphone  (DILAUDID ) injection 1 mg (1 mg Subcutaneous Given 12/22/23 1105)  ketorolac  (TORADOL ) 30 MG/ML injection 30 mg (30 mg Intravenous Given 12/22/23 1103)  acetaminophen  (TYLENOL ) tablet 1,000 mg (1,000 mg Oral Given 12/22/23 1103)  HYDROmorphone  (DILAUDID ) injection 1 mg (1 mg Intravenous Given 12/22/23 1152)  ondansetron  (ZOFRAN ) injection 4 mg (4 mg Intravenous Given 12/22/23 1152)                                                                                                                                      Procedures Procedures  (including critical care time)  Medical Decision Making / ED Course   This patient presents to the ED for concern of sickle cell pain, this involves an extensive number of treatment options, and is a complaint that carries with it a high risk of complications and morbidity.  The differential diagnosis includes sickle cell pain crisis, less likely acute chest.  MDM: Patient does have some elevated reticulocytes, hemoglobin stable.  He does not have a fever, no O2 requirements.  My dependent review the patient's chest x-ray shows no infiltrate.  Spoke with the sickle cell clinic, they recommended having a send the patient over to their facility.  Will discharge patient and have tech transport the patient with IV.   Additional history obtained: -Additional history obtained from  -External records from outside source obtained and reviewed including: Chart review including previous notes, labs, imaging, consultation notes   Lab Tests: -I ordered, reviewed, and interpreted labs.   The pertinent results include:   Labs Reviewed  COMPREHENSIVE METABOLIC PANEL WITH GFR - Abnormal; Notable for the following components:      Result Value   Glucose, Bld 114 (*)    Creatinine, Ser 0.42 (*)    Calcium 8.8 (*)    AST 52 (*)    Total Bilirubin 4.1 (*)    All other components within normal limits  CBC WITH DIFFERENTIAL/PLATELET - Abnormal; Notable for the following components:   RBC 2.37 (*)    Hemoglobin 7.9 (*)    HCT 22.4 (*)    RDW 26.6 (*)    Platelets 454 (*)    nRBC 2.1 (*)    All other components within normal limits  RETICULOCYTES - Abnormal; Notable for the following components:   Retic Ct Pct 9.0 (*)    RBC. 2.41 (*)    Retic Count, Absolute 217.1 (*)    Immature Retic Fract 25.9 (*)    All other components within normal  limits  HCG, SERUM, QUALITATIVE      EKG     Imaging Studies ordered: I ordered imaging studies including chest x-ray I independently visualized and interpreted imaging. I agree with the radiologist interpretation   Medicines  ordered and prescription drug management: Meds ordered this encounter  Medications   HYDROmorphone  (DILAUDID ) injection 1 mg   ketorolac  (TORADOL ) 30 MG/ML injection 30 mg   acetaminophen  (TYLENOL ) tablet 1,000 mg   HYDROmorphone  (DILAUDID ) injection 1 mg   ondansetron  (ZOFRAN ) injection 4 mg    -I have reviewed the patients home medicines and have made adjustments as needed   Cardiac Monitoring: The patient was maintained on a cardiac monitor.  I personally viewed and interpreted the cardiac monitored which showed an underlying rhythm of: Normal sinus rhythm  Social Determinants of Health:  Factors impacting patients care include: Multiple medical comorbidities including sickle cell disease   Reevaluation: After the interventions noted above, I reevaluated the patient and found that they have :improved  Co morbidities that complicate the patient evaluation  Past Medical History:  Diagnosis Date   Sickle cell anemia (HCC)    Sickle cell disease (HCC)       Dispostion: Discharge the patient and transporting her to our sickle cell pain clinic.    Final Clinical Impression(s) / ED Diagnoses Final diagnoses:  Sickle cell pain crisis (HCC)     @PCDICTATION @    Afton Horse T, DO 12/22/23 1208

## 2023-12-22 NOTE — ED Notes (Signed)
 Pt informed nurse she only wants ultrasound IV completed

## 2023-12-22 NOTE — Progress Notes (Signed)
 RX refill sent to pharmacy, PDMP website reviewed.

## 2023-12-22 NOTE — ED Notes (Signed)
 Patient being D/C to be taken to Sickle Cell clinic, Tech to transport patient, RN secured 20g ultrasound IV place by Dr Florie Husband

## 2023-12-28 ENCOUNTER — Ambulatory Visit: Payer: Self-pay | Admitting: Nurse Practitioner

## 2024-01-16 ENCOUNTER — Emergency Department (HOSPITAL_COMMUNITY)
Admission: EM | Admit: 2024-01-16 | Discharge: 2024-01-17 | Disposition: A | Discharge status: Home / Self Care | Attending: Emergency Medicine | Admitting: Emergency Medicine

## 2024-01-16 ENCOUNTER — Other Ambulatory Visit: Payer: Self-pay

## 2024-01-16 ENCOUNTER — Encounter (HOSPITAL_COMMUNITY): Payer: Self-pay | Admitting: Emergency Medicine

## 2024-01-16 DIAGNOSIS — D5700 Hb-SS disease with crisis, unspecified: Secondary | ICD-10-CM | POA: Insufficient documentation

## 2024-01-16 NOTE — ED Notes (Signed)
 Patient stated she is very hard stick and wants to wait for US  IV.

## 2024-01-16 NOTE — ED Triage Notes (Signed)
 Patient c/o sickle cell crisis pain x 1 day. Patient report taking PRN pain medication without relief. Patient report nausea, denies vomiting. Patient 10/10 generalize pain.

## 2024-01-17 LAB — CBC WITH DIFFERENTIAL/PLATELET
Abs Immature Granulocytes: 0.04 10*3/uL (ref 0.00–0.07)
Basophils Absolute: 0.1 10*3/uL (ref 0.0–0.1)
Basophils Relative: 1 %
Eosinophils Absolute: 0.1 10*3/uL (ref 0.0–0.5)
Eosinophils Relative: 1 %
HCT: 28.9 % — ABNORMAL LOW (ref 36.0–46.0)
Hemoglobin: 9.7 g/dL — ABNORMAL LOW (ref 12.0–15.0)
Immature Granulocytes: 0 %
Lymphocytes Relative: 18 %
Lymphs Abs: 1.9 10*3/uL (ref 0.7–4.0)
MCH: 34.2 pg — ABNORMAL HIGH (ref 26.0–34.0)
MCHC: 33.6 g/dL (ref 30.0–36.0)
MCV: 101.8 fL — ABNORMAL HIGH (ref 80.0–100.0)
Monocytes Absolute: 1.2 10*3/uL — ABNORMAL HIGH (ref 0.1–1.0)
Monocytes Relative: 12 %
Neutro Abs: 6.9 10*3/uL (ref 1.7–7.7)
Neutrophils Relative %: 68 %
Platelets: 588 10*3/uL — ABNORMAL HIGH (ref 150–400)
RBC: 2.84 MIL/uL — ABNORMAL LOW (ref 3.87–5.11)
RDW: 24.6 % — ABNORMAL HIGH (ref 11.5–15.5)
WBC: 10.2 10*3/uL (ref 4.0–10.5)
nRBC: 2.5 % — ABNORMAL HIGH (ref 0.0–0.2)

## 2024-01-17 LAB — COMPREHENSIVE METABOLIC PANEL WITH GFR
ALT: 19 U/L (ref 0–44)
AST: 33 U/L (ref 15–41)
Albumin: 4.7 g/dL (ref 3.5–5.0)
Alkaline Phosphatase: 95 U/L (ref 38–126)
Anion gap: 5 (ref 5–15)
BUN: 24 mg/dL — ABNORMAL HIGH (ref 6–20)
CO2: 24 mmol/L (ref 22–32)
Calcium: 9.2 mg/dL (ref 8.9–10.3)
Chloride: 107 mmol/L (ref 98–111)
Creatinine, Ser: 0.43 mg/dL — ABNORMAL LOW (ref 0.44–1.00)
GFR, Estimated: >60 mL/min (ref >60)
Glucose, Bld: 94 mg/dL (ref 70–99)
Potassium: 4.2 mmol/L (ref 3.5–5.1)
Sodium: 136 mmol/L (ref 135–145)
Total Bilirubin: 3.5 mg/dL — ABNORMAL HIGH (ref 0.0–1.2)
Total Protein: 8.1 g/dL (ref 6.5–8.1)

## 2024-01-17 LAB — HCG, SERUM, QUALITATIVE: Preg, Serum: NEGATIVE

## 2024-01-17 LAB — RETICULOCYTES
Immature Retic Fract: 31.4 % — ABNORMAL HIGH (ref 2.3–15.9)
RBC.: 2.79 MIL/uL — ABNORMAL LOW (ref 3.87–5.11)
Retic Count, Absolute: 286.3 10*3/uL — ABNORMAL HIGH (ref 19.0–186.0)
Retic Ct Pct: 10.3 % — ABNORMAL HIGH (ref 0.4–3.1)

## 2024-01-17 MED ORDER — KETOROLAC TROMETHAMINE 15 MG/ML IJ SOLN
15.0000 mg | INTRAMUSCULAR | Status: AC
  Administered 2024-01-17: 15 mg via INTRAVENOUS
  Filled 2024-01-17: qty 1

## 2024-01-17 MED ORDER — HYDROMORPHONE HCL 1 MG/ML IJ SOLN
2.0000 mg | INTRAMUSCULAR | Status: AC
  Administered 2024-01-17: 2 mg via SUBCUTANEOUS
  Filled 2024-01-17: qty 2

## 2024-01-17 MED ORDER — ONDANSETRON HCL 4 MG/2ML IJ SOLN
4.0000 mg | INTRAMUSCULAR | Status: DC | PRN
  Administered 2024-01-17: 4 mg via INTRAVENOUS
  Filled 2024-01-17: qty 2

## 2024-01-17 MED ORDER — DIPHENHYDRAMINE HCL 25 MG PO CAPS
25.0000 mg | ORAL_CAPSULE | ORAL | Status: DC | PRN
  Administered 2024-01-17: 50 mg via ORAL
  Filled 2024-01-17: qty 1

## 2024-01-17 MED ORDER — ACETAMINOPHEN 325 MG PO TABS
650.0000 mg | ORAL_TABLET | Freq: Once | ORAL | Status: AC
Start: 2024-01-17 — End: 2024-01-17
  Administered 2024-01-17: 650 mg via ORAL
  Filled 2024-01-17: qty 2

## 2024-01-17 MED ORDER — ONDANSETRON HCL 4 MG/2ML IJ SOLN: 4.0000 mg | Freq: Once | INTRAMUSCULAR | Status: DC

## 2024-01-17 MED ORDER — OXYCODONE HCL 5 MG PO TABS: 5.0000 mg | ORAL_TABLET | Freq: Once | ORAL | Status: DC

## 2024-01-17 MED ORDER — DROPERIDOL 2.5 MG/ML IJ SOLN
0.6250 mg | Freq: Once | INTRAMUSCULAR | Status: AC
Start: 2024-01-17 — End: 2024-01-17
  Administered 2024-01-17: 0.625 mg via INTRAVENOUS
  Filled 2024-01-17: qty 2

## 2024-01-17 NOTE — ED Provider Notes (Signed)
  EMERGENCY DEPARTMENT AT [facility] Provider Note   CSN: [id number] Arrival date & time: 01/16/24  2238     History  Chief Complaint  Patient presents with   Sickle Cell Pain Crisis    Gabriella Griffin is a 33 y.o. female.  She has sickle cell disease.  Presents the ER today complaining of pain in her bilateral legs and right arm.  Feels like her typical sickle cell pain.  No chest pain, no back pain no shortness of breath.  No cough, no fever.  She has not had any nausea or vomiting.  No numbness tingling or weakness.  She took her home pain medicine, without significant relief.      Sickle Cell Pain Crisis      Home Medications Prior to Admission medications   Medication Sig Start Date End Date Taking? Authorizing Provider  escitalopram  (LEXAPRO ) 5 MG tablet Take 1 tablet (5 mg total) by mouth daily. Patient not taking: Reported on 11/23/2017 10/12/17   Davida Espy, MD  folic acid  (FOLVITE ) 1 MG tablet Take 2 tablets (2 mg total) by mouth daily. Patient taking differently: Take 1 mg by mouth at bedtime. 06/16/15   Lynnetta Sauce, MD  HYDROmorphone  (DILAUDID ) 2 MG tablet Take 1 tablet (2 mg total) by mouth every 4 (four) hours as needed for severe pain. Patient taking differently: Take 2 mg by mouth every 6 (six) hours as needed for severe pain (pain score 7-10). 08/27/15   Tresia Fruit, MD  hydroxyurea  (HYDREA ) 500 MG capsule Take 1 capsule (500 mg total) by mouth daily. May take with food to minimize GI side effects. Patient taking differently: Take 1,000 mg by mouth at bedtime. May take with food to minimize GI side effects. 08/29/15   Harlee Lichtenstein, CNM  naproxen  sodium (ALEVE ) 220 MG tablet Take 220 mg by mouth daily as needed (pain).    [provider]  oxycodone  (OXY-IR) 5 MG capsule Take 2 capsules (10 mg total) by mouth every 6 (six) hours as needed for pain. 12/22/23   Lorel Roes, NP      Allergies    Azithromycin ,  Ceftin [cefuroxime axetil], and Fentanyl     Review of Systems   Review of Systems  Physical Exam Updated Vital Signs BP 117/73   Pulse 85   Temp 98 F (36.7 C)   Resp 16   Ht 5\' 2"  (1.575 m)   Wt 50 kg   LMP 01/02/2024   SpO2 100%   BMI 20.16 kg/m  Physical Exam Vitals and nursing note reviewed.  Constitutional:      General: She is not in acute distress.    Appearance: She is well-developed.  HENT:     Head: Normocephalic and atraumatic.     Mouth/Throat:     Mouth: Mucous membranes are moist.  Eyes:     Conjunctiva/sclera: Conjunctivae normal.     Pupils: Pupils are equal, round, and reactive to light.  Cardiovascular:     Rate and Rhythm: Normal rate and regular rhythm.     Heart sounds: No murmur heard. Pulmonary:     Effort: Pulmonary effort is normal. No respiratory distress.     Breath sounds: Normal breath sounds.  Abdominal:     Palpations: Abdomen is soft.     Tenderness: There is no abdominal tenderness.  Musculoskeletal:        General: No swelling.     Cervical back: Neck supple.  Skin:  General: Skin is warm and dry.     Capillary Refill: Capillary refill takes less than 2 seconds.  Neurological:     General: No focal deficit present.     Mental Status: She is alert and oriented to person, place, and time.  Psychiatric:        Mood and Affect: Mood normal.     ED Results / Procedures / Treatments   Labs (all labs ordered are listed, but only abnormal results are displayed) Labs Reviewed  COMPREHENSIVE METABOLIC PANEL WITH GFR  CBC WITH DIFFERENTIAL/PLATELET  RETICULOCYTES  HCG, SERUM, QUALITATIVE    EKG None  Radiology No results found.  Procedures Procedures    Medications Ordered in ED Medications - No data to display  ED Course/ Medical Decision Making/ A&P                                 Medical Decision Making Differential diagnosis includes but not limited to sickle cell crisis, opiate dependence, opioid tolerance,  muscle strain, anemia, other  ED course: Patient comes in with sickle cell pain, feels like her typical pain crisis, not controlled with her home pain meds.  She is given 3 doses of subcu Dilaudid  and Toradol  and reports the pain in her arm is really gone, pain in her legs is now tolerable and she wants to go home to be with her children.  Discussed with patient her hemoglobin is not low, bilirubin is not as high as it has been in the past,, reticulocytes are elevated and she does have sickle cells on her differential consistent with crisis.  Reassuring that she is feeling better, advised to continue pain meds at home.  Patient was agreeable but upon discharge was asking for additional pain medicine for home.  She states that he has about 2 days left.  She states that she cannot get into the clinic in [place] until the end of the month.  She states she is on the cancellation list but cannot get in.  She states she had previously been following in [place] but cannot go back.  She tells me that it is because she does not have a [facility] number and there is a problem with her insurance.  She states she had missed an appointment as well with them.  I reviewed patient's PDMP, she has had numerous fills of oxycodone  of varying strengths from multiple providers for short-term since the beginning of the year.  I read all of her old records, I saw at her last visit they did [facility] in December, she was told they would no longer manage her opiate pain medications because of positive drug screens, and incident where she had filled somebody else's prescription apparently, not providing urine prior to more recent fill despite being told to, and also did not present for a pill count.  She was offered opioid pain medication and did not apparently want that and decided to switch clinics, but has not been yet.  Peers that she had an appointment on May 14 as a new patient and missed this appointment as  well.  I saw the patient I was not comfortable filling further opiate pain meds for outpatient visit.  Encouraged to follow-up as outpatient.  She was given strict return precautions.  Patient did not have signs of acute chest pain, no worsened anemia, no signs of sepsis.  Amount and/or Complexity of Data Reviewed External  Data Reviewed: labs and notes. Labs: ordered.  Risk OTC drugs. Prescription drug management.           Final Clinical Impression(s) / ED Diagnoses Final diagnoses:  None    Rx / DC Orders ED Discharge Orders     None         Joshua Nieves 01/17/24 1610    Palumbo, April, MD 01/17/24 [phone]

## 2024-01-17 NOTE — Discharge Instructions (Signed)
 Today for sickle cell pain crisis.  Please follow-up with your office in [place].  Come back for new or worsening symptoms.  Unfortunately we cannot provide further refills of chronic opiate pain medications.

## 2024-03-22 ENCOUNTER — Ambulatory Visit: Payer: Self-pay | Admitting: Nurse Practitioner

## 2024-03-22 ENCOUNTER — Encounter: Payer: Self-pay | Admitting: Nurse Practitioner

## 2024-03-22 VITALS — BP 108/58 | HR 86 | Temp 98.0°F | Wt 114.8 lb

## 2024-03-22 DIAGNOSIS — D5700 Hb-SS disease with crisis, unspecified: Secondary | ICD-10-CM

## 2024-03-22 MED ORDER — HYDROXYUREA 500 MG PO CAPS
500.0000 mg | ORAL_CAPSULE | Freq: Every day | ORAL | 0 refills | Status: DC
Start: 2024-03-22 — End: 2024-03-23

## 2024-03-22 NOTE — Patient Instructions (Signed)
 Living With Sickle Cell Disease Living with a long-term condition, such as sickle cell disease, can be a challenge. It can affect both your physical and mental health. You may not have total control over your condition. But proper care and treatment can help manage the effects of the disease so you can feel good and lead an active life. You can take steps to manage your condition and stay as healthy as possible. How does sickle cell disease affect me? Sickle cell disease can cause challenges that affect your quality of life. You may get sick more often as a result of organ damage and infections. Sometimes you may need to stay in the hospital. Learn how to recognize that you are not feeling well and that you may be getting sick. What actions can I take to manage my condition?  The goals of treatment are to control your symptoms and prevent and treat problems. Work with your health care provider to create a treatment plan that works for you. Taking an active role in managing your condition can help you feel more in control of your situation. Ask about possible side effects of medicines that your health care provider recommends. Discuss how you feel about having those side effects. Keeping a healthy lifestyle can help you manage your condition. This includes eating a healthy diet, getting enough sleep, and getting regular exercise. Sickle cell disease may affect your ability to take care of your basic needs. Tell your health care provider if you have concerns about any of these needs: Access to food. Housing. Safe drinking water and other utilities. Safety in your home and community. Work or school. Transportation. Paying for health care. Your health care provider may be able to connect you with community resources that can help you. How to manage stress  Living with sickle cell disease can be stressful. This disease can have a big impact on your mental health. Talk with your health care provider  about ways to reduce your stress or if you have concerns about your mental health.  To cope with stress, try: Keeping a stress diary. This can help you learn what causes your stress to start (figure out your triggers) and how to control your response to those triggers. Spending time doing things that you enjoy, such as: Hobbies. Being outdoors. Spending time with friends and people who make you laugh. Doing yoga, muscle relaxation, deep breathing, or mindfulness practices. Expressing yourself through journal writing, art, crafting, poetry, or playing music. Staying positive about your health. Try to accept that you cannot control your condition perfectly. Follow these instructions at home: Medicines Take over-the-counter and prescription medicines only as told by your health care provider. If you were prescribed antibiotics, take them as told by your health care provider. Do not stop taking them even if you start to feel better. If you develop a fever, do not take medicines to reduce the fever right away. This could cover up another problem. Contact your health care provider. Eating and drinking Drink enough fluid to keep your urine pale yellow. Drink more in hot weather and during exercise. Limit or avoid drinking alcohol. Eat a balanced and nutritious diet. Eat plenty of fruits, vegetables, whole grains, and lean protein. Take vitamins and supplements as told by your health care provider. Traveling When traveling, keep these with you: Your medical information. The names of your health care providers. Your medicines. If you have to travel by air, ask about precautions you should take. Managing pain Work with  your health care provider to create a pain management plan that works for you. The plan may include: Ways to reduce or manage your pain at home, such as: Using a heating pad. Taking a warm bath. Using healthy ways to distract you from the pain, such as hobbies or  reading. Practicing ways to relax, such as doing yoga or listening to music. Getting massages. Doing exercises or stretches as told by a physical therapist. Tracking how pain affects your daily life functions. When to seek help. Who to contact and what to do in case of a pain emergency. General instructions Do not use any products that contain nicotine or tobacco. These products include cigarettes, chewing tobacco, and vaping devices, such as e-cigarettes. These lower blood oxygen levels. If you need help quitting, ask your health care provider. Consider wearing a medical alert bracelet. Use an app or journal to track your symptoms, assess your level of pain and fatigue, and keep track of your medicines. Avoid the following: High altitudes. Very high or low temperatures and big changes in temperature. Activities that will lower your oxygen levels, such as mountain climbing or doing exercise that takes a lot of effort. Stay up to date on: Your treatment plan. Learn as much as you can about your condition. Health screenings. This will help prevent problems or catch them early on. Vaccines. This will help prevent infection. Wash your hands often with soap and water to help prevent infections. Wash them for at least 20 seconds each time. Keep all follow-up visits. Regular follow-up with your health care provider can help you better manage your condition. Where to find support You can find help and support through: Talking with a therapist or taking part in support groups. Sickle Cell Disease Foundation of [place]: www.sicklecelldisease.org Where to find more information Centers for Disease Control and Prevention: FootballExhibition.com.br American Society of Hematology: www.hematology.org Contact a health care provider if: Your symptoms get worse. You have new symptoms. You have a fever. Get help right away if: You have a painful erection of the penis that lasts a long time (priapism). You become  short of breath or are having trouble breathing. You have pain that cannot be controlled with medicine. You have any signs of a stroke. "BE FAST" is an easy way to remember the main warning signs: B - Balance. Dizziness, sudden trouble walking, or loss of balance. E - Eyes. Trouble seeing or a change in how you see. F - Face. Sudden weakness or loss of feeling of the face. The face or eyelid may droop on one side. A - Arms. Weakness or loss of feeling in an arm. This happens all of a sudden and most often on one side of the body. S - Speech. Sudden trouble speaking, slurred speech, or trouble understanding what people say. T - Time. Time to call emergency services. Write down what time symptoms started. You have other signs of a stroke, such as: A sudden, very bad headache with no known cause. Feeling like you may vomit (nausea). Vomiting. Seizure. These symptoms may be an emergency. Get help right away. Call 911. Do not wait to see if the symptoms will go away. Do not drive yourself to the hospital. Also, get help right away if: You have strong feelings of sadness or loss of hope, or you have thoughts about hurting yourself or others. Take one of these steps if you feel like you may hurt yourself or others, or have thoughts about taking your own life:  Go to your nearest emergency room. Call 911. Call the National Suicide Prevention Lifeline at (915)371-4213 or 988. This is open 24 hours a day. Text the Crisis Text Line at (909)230-5332. Summary Proper care and treatment can help manage the effects of sickle cell disease so you can feel good and lead an active life. The goals of treatment are to control your symptoms and prevent and treat problems. Taking an active role in managing your condition can help you feel more in control of your situation. Work with your health care provider to create a pain management plan that works for you. Get medical help right away as told by your health care  provider. This information is not intended to replace advice given to you by your health care provider. Make sure you discuss any questions you have with your health care provider. Document Revised: 11/09/2021 Document Reviewed: 11/09/2021 Elsevier Patient Education  2024 [employer].

## 2024-03-22 NOTE — Progress Notes (Signed)
 Subjective   Patient ID: Gabriella Griffin, female    DOB: 08/03/1991, 33 y.o.   MRN: [id number]  Chief Complaint  Patient presents with   Medical Management of Chronic Issues   Establish Care    Referring provider: No ref. provider found  Gabriella Griffin is a 33 y.o. female with Past Medical History: No date: Sickle cell anemia (HCC) No date: Sickle cell disease (HCC)   HPI  Patient presents today to establish care.  She was previously seen at [facility] for sickle cell.  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  Overall doing well.  We will check labs today. Denies f/c/s, n/v/d, hemoptysis, PND, leg swelling Denies chest pain or edema           Allergies  Allergen Reactions   Vancomycin Dermatitis    WAS RECIEVING AZITHROMYCIN  AND VANCOMYCIN AT THE SAME TIME - unable to determine which was the causative agent. Likely only allergic to one.   Rash to her IV site that spread up her vein and she reports vein appear purple-ish. She did have itching to her left arm and head along with feeling anxious. She was given IV benadryl  with relief.  WAS RECIEVING AZITHROMYCIN  AND VANCOMYCIN AT THE SAME TIME - unable to determine which was the causative agent. Likely only allergic to one.     Rash to her IV site that spread up her vein and she reports vein appear purple-ish. She did have itching to her left arm and head along with feeling anxious. She was given IV benadryl  with relief.   Azithromycin  Rash   Ceftin [Cefuroxime Axetil] Rash   Fentanyl  Rash    Immunization History  Administered Date(s) Administered   Influenza,inj,Quad PF,6+ Mos 06/14/2015, 06/27/2017   Pneumococcal Polysaccharide-23 07/11/2017   Tdap 08/06/2015    Tobacco History: Social History   Tobacco Use  Smoking Status Never  Smokeless Tobacco Never   Counseling given: Not Answered   Outpatient Encounter Medications as of 03/22/2024  Medication Sig   cyanocobalamin (VITAMIN B12) 1000 MCG tablet Take  1,000 mcg by mouth.   DULoxetine (CYMBALTA) 30 MG capsule Take 30 mg by mouth.   folic acid  (FOLVITE ) 1 MG tablet Take 2 tablets (2 mg total) by mouth daily.   oxycodone  (OXY-IR) 5 MG capsule Take 2 capsules (10 mg total) by mouth every 6 (six) hours as needed for pain.   Vitamin D, Ergocalciferol, (DRISDOL) 1.25 MG (50000 UNIT) CAPS capsule Take 50,000 Units by mouth.   [DISCONTINUED] hydroxyurea  (HYDREA ) 500 MG capsule Take 1 capsule (500 mg total) by mouth daily. May take with food to minimize GI side effects.   escitalopram  (LEXAPRO ) 5 MG tablet Take 1 tablet (5 mg total) by mouth daily. (Patient not taking: Reported on 11/23/2017)   HYDROmorphone  (DILAUDID ) 2 MG tablet Take 1 tablet (2 mg total) by mouth every 4 (four) hours as needed for severe pain. (Patient taking differently: Take 2 mg by mouth every 6 (six) hours as needed for severe pain (pain score 7-10).)   hydroxyurea  (HYDREA ) 500 MG capsule Take 1 capsule (500 mg total) by mouth daily. May take with food to minimize GI side effects.   naproxen  sodium (ALEVE ) 220 MG tablet Take 220 mg by mouth daily as needed (pain).   No facility-administered encounter medications on file as of 03/22/2024.    Review of Systems  Review of Systems  Constitutional: Negative.   HENT: Negative.    Cardiovascular: Negative.   Gastrointestinal: Negative.  Allergic/Immunologic: Negative.   Neurological: Negative.   Psychiatric/Behavioral: Negative.       Objective:   BP (!) 108/58   Pulse 86   Temp 98 F (36.7 C) (Oral)   Wt 114 lb 12.8 oz (52.1 kg)   SpO2 96%   BMI 21.00 kg/m   Wt Readings from Last 5 Encounters:  03/22/24 114 lb 12.8 oz (52.1 kg)  01/16/24 110 lb 3.7 oz (50 kg)  12/22/23 111 lb (50.3 kg)  11/25/17 121 lb 4.1 oz (55 kg)  10/02/17 115 lb (52.2 kg)     Physical Exam Vitals and nursing note reviewed.  Constitutional:      General: She is not in acute distress.    Appearance: She is well-developed.   Cardiovascular:     Rate and Rhythm: Normal rate and regular rhythm.  Pulmonary:     Effort: Pulmonary effort is normal.     Breath sounds: Normal breath sounds.  Neurological:     Mental Status: She is alert and oriented to person, place, and time.       Assessment & Plan:   Sickle cell pain crisis (HCC) -     ToxAssure Flex 15, Ur -     Sickle Cell Panel -     Hydroxyurea ; Take 1 capsule (500 mg total) by mouth daily. May take with food to minimize GI side effects.  Dispense: 20 capsule; Refill: 0     Return in about 3 months (around 06/22/2024).   Bascom GORMAN Borer, NP 03/22/2024

## 2024-03-23 ENCOUNTER — Telehealth: Payer: Self-pay | Admitting: Nurse Practitioner

## 2024-03-23 ENCOUNTER — Other Ambulatory Visit: Payer: Self-pay | Admitting: Nurse Practitioner

## 2024-03-23 ENCOUNTER — Ambulatory Visit: Payer: Self-pay | Admitting: Nurse Practitioner

## 2024-03-23 ENCOUNTER — Telehealth: Payer: Self-pay

## 2024-03-23 DIAGNOSIS — D5700 Hb-SS disease with crisis, unspecified: Secondary | ICD-10-CM

## 2024-03-23 LAB — CMP14+CBC/D/PLT+FER+RETIC+V...
ALT: 13 IU/L (ref 0–32)
AST: 43 IU/L — ABNORMAL HIGH (ref 0–40)
Albumin: 4.7 g/dL (ref 3.9–4.9)
Alkaline Phosphatase: 91 IU/L (ref 44–121)
BUN/Creatinine Ratio: 15 (ref 9–23)
BUN: 10 mg/dL (ref 6–20)
Basophils Absolute: 0.1 x10E3/uL (ref 0.0–0.2)
Basos: 1 %
Bilirubin Total: 4.9 mg/dL — ABNORMAL HIGH (ref 0.0–1.2)
CO2: 21 mmol/L (ref 20–29)
Calcium: 9.2 mg/dL (ref 8.7–10.2)
Chloride: 104 mmol/L (ref 96–106)
Creatinine, Ser: 0.67 mg/dL (ref 0.57–1.00)
EOS (ABSOLUTE): 0.3 x10E3/uL (ref 0.0–0.4)
Eos: 3 %
Ferritin: 496 ng/mL — ABNORMAL HIGH (ref 15–150)
Globulin, Total: 3 g/dL (ref 1.5–4.5)
Glucose: 94 mg/dL (ref 70–99)
Hematocrit: 26.6 % — ABNORMAL LOW (ref 34.0–46.6)
Hemoglobin: 8.7 g/dL — ABNORMAL LOW (ref 11.1–15.9)
Immature Grans (Abs): 0 x10E3/uL (ref 0.0–0.1)
Immature Granulocytes: 0 %
Lymphocytes Absolute: 2.8 x10E3/uL (ref 0.7–3.1)
Lymphs: 30 %
MCH: 35.2 pg — ABNORMAL HIGH (ref 26.6–33.0)
MCHC: 32.7 g/dL (ref 31.5–35.7)
MCV: 108 fL — ABNORMAL HIGH (ref 79–97)
Monocytes Absolute: 0.9 x10E3/uL (ref 0.1–0.9)
Monocytes: 9 %
Neutrophils Absolute: 5.2 x10E3/uL (ref 1.4–7.0)
Neutrophils: 57 %
Platelets: 552 x10E3/uL — ABNORMAL HIGH (ref 150–450)
Potassium: 4.7 mmol/L (ref 3.5–5.2)
RBC: 2.47 x10E6/uL — CL (ref 3.77–5.28)
RDW: 20.7 % — ABNORMAL HIGH (ref 11.7–15.4)
Retic Ct Pct: 5.7 % — ABNORMAL HIGH (ref 0.6–2.6)
Sodium: 140 mmol/L (ref 134–144)
Total Protein: 7.7 g/dL (ref 6.0–8.5)
Vit D, 25-Hydroxy: 15.5 ng/mL — ABNORMAL LOW (ref 30.0–100.0)
WBC: 9.3 x10E3/uL (ref 3.4–10.8)
eGFR: 119 mL/min/1.73 (ref >59)

## 2024-03-23 MED ORDER — HYDROXYUREA 500 MG PO CAPS: 500.0000 mg | ORAL_CAPSULE | Freq: Two times a day (BID) | ORAL | 0 refills | Status: DC

## 2024-03-23 NOTE — Telephone Encounter (Unsigned)
 Copied from CRM #[id number]. Topic: Clinical - Medication Refill >> Mar 23, 2024  9:46 AM Zebedee SAUNDERS wrote: Medication: oxycodone  (OXY-IR) 5 MG capsule, and Vit D once a week  Has the patient contacted their pharmacy? Yes (Agent: If no, request that the patient contact the pharmacy for the refill. If patient does not wish to contact the pharmacy document the reason why and proceed with request.) (Agent: If yes, when and what did the pharmacy advise?)  This is the patient's preferred pharmacy:   [facility] - [place], [facility] [place] [address] Phone: [phone] Fax: [phone]  Is this the correct pharmacy for this prescription? Yes If no, delete pharmacy and type the correct one.   Has the prescription been filled recently? Yes  Is the patient out of the medication? Yes  Has the patient been seen for an appointment in the last year OR does the patient have an upcoming appointment? Yes  Can we respond through MyChart? Yes  Agent: Please be advised that Rx refills may take up to 3 business days. We ask that you follow-up with your pharmacy.

## 2024-03-23 NOTE — Telephone Encounter (Unsigned)
 Copied from CRM #[id number]. Topic: Clinical - Medication Refill >> Mar 23, 2024  9:37 AM Zebedee SAUNDERS wrote: Medication: hydroxyurea  (HYDREA ) 500 MG capsule  Has the patient contacted their pharmacy? Yes (Agent: If no, request that the patient contact the pharmacy for the refill. If patient does not wish to contact the pharmacy document the reason why and proceed with request.) (Agent: If yes, when and what did the pharmacy advise?)  This is the patient's preferred pharmacy:  [id number] [address], [place] - [address] AT [facility] OF [facility] & [address] [place] [address] Phone: [phone] Fax: [phone]  [facility] - [place], [place] - [facility] [place] [address] Phone: [phone] Fax: [phone]  Is this the correct pharmacy for this prescription? Yes If no, delete pharmacy and type the correct one.   Has the prescription been filled recently? Yes  Is the patient out of the medication? Yes  Has the patient been seen for an appointment in the last year OR does the patient have an upcoming appointment? Yes  Can we respond through MyChart? Yes  Agent: Please be advised that Rx refills may take up to 3 business days. We ask that you follow-up with your pharmacy. >> Mar 23, 2024  9:42 AM Zebedee SAUNDERS wrote: Send to: [facility] - [place], [facility] [place] [address] Phone: [phone] Fax: [phone]

## 2024-03-23 NOTE — Telephone Encounter (Signed)
 Copied from CRM #[id number]. Topic: Clinical - Medication Refill >> Mar 23, 2024  9:37 AM Zebedee SAUNDERS wrote: Medication: hydroxyurea  (HYDREA ) 500 MG capsule  Has the patient contacted their pharmacy? Yes (Agent: If no, request that the patient contact the pharmacy for the refill. If patient does not wish to contact the pharmacy document the reason why and proceed with request.) (Agent: If yes, when and what did the pharmacy advise?)  This is the patient's preferred pharmacy:  [facility] #[id number] [address], [place] - [address] AT [facility] OF [facility] & [address] [place] [address] Phone: [phone] Fax: [phone]  [facility] - [place], [place] - [facility] [place] [address] Phone: [phone] Fax: [phone]  Is this the correct pharmacy for this prescription? Yes If no, delete pharmacy and type the correct one.   Has the prescription been filled recently? Yes  Is the patient out of the medication? Yes  Has the patient been seen for an appointment in the last year OR does the patient have an upcoming appointment? Yes  Can we respond through MyChart? Yes  Agent: Please be advised that Rx refills may take up to 3 business days. We ask that you follow-up with your pharmacy. >> Mar 23, 2024  9:42 AM Zebedee SAUNDERS wrote: Send to: [facility] - [place], [facility] [place] [address] Phone: [phone] Fax: [phone]

## 2024-03-23 NOTE — Telephone Encounter (Unsigned)
 Copied from CRM #[id number]. Topic: Clinical - Medication Refill >> Mar 23, 2024  9:37 AM Zebedee SAUNDERS wrote: Medication: hydroxyurea  (HYDREA ) 500 MG capsule  Has the patient contacted their pharmacy? Yes (Agent: If no, request that the patient contact the pharmacy for the refill. If patient does not wish to contact the pharmacy document the reason why and proceed with request.) (Agent: If yes, when and what did the pharmacy advise?)  This is the patient's preferred pharmacy:  [facility] #[id number] [address], [place] - [address] AT [facility] OF [facility] & [address] [place] [address] Phone: [phone] Fax: [phone]  [facility] - [place], [place] - [facility] [place] [address] Phone: [phone] Fax: [phone]  Is this the correct pharmacy for this prescription? Yes If no, delete pharmacy and type the correct one.   Has the prescription been filled recently? Yes  Is the patient out of the medication? Yes  Has the patient been seen for an appointment in the last year OR does the patient have an upcoming appointment? Yes  Can we respond through MyChart? Yes  Agent: Please be advised that Rx refills may take up to 3 business days. We ask that you follow-up with your pharmacy.

## 2024-03-26 ENCOUNTER — Other Ambulatory Visit: Payer: Self-pay

## 2024-03-26 ENCOUNTER — Telehealth: Payer: Self-pay

## 2024-03-26 ENCOUNTER — Other Ambulatory Visit: Payer: Self-pay | Admitting: Nurse Practitioner

## 2024-03-26 DIAGNOSIS — D5700 Hb-SS disease with crisis, unspecified: Secondary | ICD-10-CM

## 2024-03-26 MED ORDER — OXYCODONE HCL 20 MG PO TABS: 20.0000 mg | ORAL_TABLET | Freq: Four times a day (QID) | ORAL | 0 refills | Status: DC | PRN

## 2024-03-26 MED ORDER — VITAMIN D (ERGOCALCIFEROL) 1.25 MG (50000 UNIT) PO CAPS: 50000.0000 [IU] | ORAL_CAPSULE | ORAL | 2 refills | Status: DC

## 2024-03-26 MED ORDER — HYDROXYUREA 500 MG PO CAPS: 500.0000 mg | ORAL_CAPSULE | Freq: Two times a day (BID) | ORAL | 0 refills | Status: DC

## 2024-03-26 NOTE — Telephone Encounter (Signed)
 Copied from CRM [id number]. Topic: Clinical - Prescription Issue >> Mar 23, 2024  2:22 PM Deaijah H wrote: Reason for CRM: Patient needs prescription sent to different pharmacy Vit D / hydroxyurea  (HYDREA ) 500 MG capsule/oxycodone  (OXY-IR) 5 MG capsule. [facility] [address] [place] [address] Phone: [phone]

## 2024-03-26 NOTE — Telephone Encounter (Signed)
 Done [facility]

## 2024-03-26 NOTE — Telephone Encounter (Signed)
 Copied from CRM #[id number]. Topic: Clinical - Medication Question >> Mar 23, 2024  5:41 PM Nathanel BROCKS wrote: Reason for CRM: pt called in and is sick and in pain. She is needing her pain medication asap.

## 2024-03-26 NOTE — Telephone Encounter (Signed)
 Copied from CRM #[id number]. Topic: Clinical - Medication Refill >> Mar 26, 2024  9:06 AM Charlet HERO wrote: Medication: hydroxyurea  (HYDREA ) 500 MG capsule,oxyCODONE  (Oxy IR/ROXICODONE ) immediate release tablet 10 mg   Has the patient contacted their pharmacy? Discontinued needed to reorder  This is the patient's preferred pharmacy:  [facility] - [place], [facility] [place] [address] Phone: [phone] Fax: [phone]  Is this the correct pharmacy for this prescription? Yes If no, delete pharmacy and type the correct one.   Has the prescription been filled recently? Yes  Is the patient out of the medication? Yes  Has the patient been seen for an appointment in the last year OR does the patient have an upcoming appointment? Yes  Can we respond through MyChart? Yes  Agent: Please be advised that Rx refills may take up to 3 business days. We ask that you follow-up with your pharmacy.

## 2024-03-27 ENCOUNTER — Telehealth: Payer: Self-pay

## 2024-03-27 ENCOUNTER — Other Ambulatory Visit: Payer: Self-pay

## 2024-03-27 NOTE — Telephone Encounter (Signed)
 Completed.

## 2024-03-29 ENCOUNTER — Telehealth: Payer: Self-pay

## 2024-03-30 LAB — TOXASSURE FLEX 15, UR
6-ACETYLMORPHINE IA: NEGATIVE ng/mL
7-aminoclonazepam: NOT DETECTED ng/mg{creat}
AMPHETAMINES IA: NEGATIVE ng/mL
Alpha-hydroxyalprazolam: NOT DETECTED ng/mg{creat}
Alpha-hydroxymidazolam: NOT DETECTED ng/mg{creat}
Alpha-hydroxytriazolam: NOT DETECTED ng/mg{creat}
Alprazolam: NOT DETECTED ng/mg{creat}
BARBITURATES IA: NEGATIVE ng/mL
BUPRENORPHINE: NEGATIVE
Benzodiazepines: NEGATIVE
Buprenorphine: NOT DETECTED ng/mg{creat}
COCAINE METABOLITE IA: NEGATIVE ng/mL
Clonazepam: NOT DETECTED ng/mg{creat}
Creatinine: 74 mg/dL (ref >=20)
Desalkylflurazepam: NOT DETECTED ng/mg{creat}
Desmethyldiazepam: NOT DETECTED ng/mg{creat}
Desmethylflunitrazepam: NOT DETECTED ng/mg{creat}
Diazepam: NOT DETECTED ng/mg{creat}
ETHYL ALCOHOL Enzymatic: NEGATIVE g/dL
FENTANYL: NEGATIVE
Fentanyl: NOT DETECTED ng/mg{creat}
Flunitrazepam: NOT DETECTED ng/mg{creat}
Lorazepam: NOT DETECTED ng/mg{creat}
METHADONE IA: NEGATIVE ng/mL
METHADONE MTB IA: NEGATIVE ng/mL
Midazolam: NOT DETECTED ng/mg{creat}
Norbuprenorphine: NOT DETECTED ng/mg{creat}
Norfentanyl: NOT DETECTED ng/mg{creat}
OPIATE CLASS IA: NEGATIVE ng/mL
Oxazepam: NOT DETECTED ng/mg{creat}
PHENCYCLIDINE IA: NEGATIVE ng/mL
TAPENTADOL, IA: NEGATIVE ng/mL
TRAMADOL IA: NEGATIVE ng/mL
Temazepam: NOT DETECTED ng/mg{creat}

## 2024-03-30 LAB — OXYCODONE CLASS, MS, UR RFX
Noroxycodone: NOT DETECTED ng/mg{creat}
Noroxymorphone: NOT DETECTED ng/mg{creat}
Oxycodone Class Confirmation: POSITIVE
Oxycodone: 3119 ng/mg{creat}
Oxymorphone: NOT DETECTED ng/mg{creat}

## 2024-03-30 LAB — CANNABINOIDS, MS, UR RFX
Cannabinoids Confirmation: POSITIVE
Carboxy-THC: 12 ng/mg{creat}

## 2024-03-30 NOTE — Telephone Encounter (Signed)
 Sent to provider

## 2024-04-06 NOTE — Telephone Encounter (Unsigned)
 Copied from CRM #[id number]. Topic: Clinical - Medication Refill >> Apr 06, 2024 12:20 PM Debby BROCKS wrote: Medication: Oxycodone  HCl 20 MG TABS  Patient also wants to refill Zofran  (Ondansetron ) because her old provider cannot provide it anymore.  Has the patient contacted their pharmacy? No (Agent: If no, request that the patient contact the pharmacy for the refill. If patient does not wish to contact the pharmacy document the reason why and proceed with request.) (Agent: If yes, when and what did the pharmacy advise?)  This is the patient's preferred pharmacy:  [facility] - [place], [facility] - [facility] [place] [address] Phone: [phone] Fax: [phone]  Is this the correct pharmacy for this prescription? Yes If no, delete pharmacy and type the correct one.   Has the prescription been filled recently? No  Is the patient out of the medication? No, But she is running low  Has the patient been seen for an appointment in the last year OR does the patient have an upcoming appointment? Yes  Can we respond through MyChart? Yes  Agent: Please be advised that Rx refills may take up to 3 business days. We ask that you follow-up with your pharmacy.

## 2024-04-09 ENCOUNTER — Ambulatory Visit: Payer: Self-pay

## 2024-04-09 ENCOUNTER — Other Ambulatory Visit: Payer: Self-pay | Admitting: Nurse Practitioner

## 2024-04-09 NOTE — Telephone Encounter (Signed)
 FYI Only or Action Required?: Action required by provider: medication refill request.  Patient was last seen in primary care on 03/22/2024 by Oley Bascom RAMAN, NP.  Called Nurse Triage reporting Back Pain.  Symptoms began yesterday.  Interventions attempted: OTC medications: Tylenol  and Prescription medications: Oxycodone .  Symptoms are: unchanged.  Triage Disposition: See PCP When Office is Open (Within 3 Days)  Patient/caregiver understands and will follow disposition?: No, refuses disposition   Copied from CRM #[id number]. Topic: Clinical - Red Word Triage >> Apr 09, 2024 10:23 AM Berwyn MATSU wrote: Red Word that prompted transfer to Nurse Triage: pain lower back Reason for Disposition  [1] MODERATE back pain (e.g., interferes with normal activities) AND [2] present > 3 days  Answer Assessment - Initial Assessment Questions Patient says she called on Friday for her oxycodone  refill because she was almost out and took her last pill at 0300 today. She says the pain is not bad enough for ED yet. Advised OV, soonest is on Friday with PCP, tomorrow with a different provider. She says she has to work tomorrow and rest of the week. She says she's off today due to not feeling well. Advised ED for worsening symptoms.  1. ONSET: When did the pain begin? (e.g., minutes, hours, days)     Around 0600 this morning 2. LOCATION: Where does it hurt? (upper, mid or lower back)     Lower back all across 3. SEVERITY: How bad is the pain?  (e.g., Scale 1-10; mild, moderate, or severe)     7 4. PATTERN: Is the pain constant? (e.g., yes, no; constant, intermittent)      Constant 5. RADIATION: Does the pain shoot into your legs or somewhere else?     A little to buttocks, sharp feeling 6. CAUSE:  What do you think is causing the back pain?      Menstrual period started yesterday, feels like typical sickle call pain 7. BACK OVERUSE:  Any recent lifting of heavy objects, strenuous work or  exercise?     No 8. MEDICINES: What have you taken so far for the pain? (e.g., nothing, acetaminophen , NSAIDS)     Pain medicine at 3am, Tylenol  at 6am 9. NEUROLOGIC SYMPTOMS: Do you have any weakness, numbness, or problems with bowel/bladder control?     No 10. OTHER SYMPTOMS: Do you have any other symptoms? (e.g., fever, abdomen pain, burning with urination, blood in urine)       Menstrual cramps to abdomen 11. PREGNANCY: Is there any chance you are pregnant? When was your last menstrual period?       On menstrual period now  Protocols used: Back Pain-A-AH

## 2024-04-09 NOTE — Telephone Encounter (Unsigned)
 Copied from CRM #[id number]. Topic: Clinical - Medication Refill >> Apr 06, 2024 12:20 PM Gabriella Griffin wrote: Medication: Oxycodone  HCl 20 MG TABS  Patient also wants to refill Zofran  (Ondansetron ) because her old provider cannot provide it anymore.  Has the patient contacted their pharmacy? No (Agent: If no, request that the patient contact the pharmacy for the refill. If patient does not wish to contact the pharmacy document the reason why and proceed with request.) (Agent: If yes, when and what did the pharmacy advise?)  This is the patient's preferred pharmacy:  [facility] - [address], [facility] [place] [address] Phone: [phone] Fax: [phone]  Is this the correct pharmacy for this prescription? Yes If no, delete pharmacy and type the correct one.   Has the prescription been filled recently? No  Is the patient out of the medication? No, But she is running low  Has the patient been seen for an appointment in the last year OR does the patient have an upcoming appointment? Yes  Can we respond through MyChart? Yes  Agent: Please be advised that Rx refills may take up to 3 business days. We ask that you follow-up with your pharmacy.

## 2024-04-09 NOTE — Telephone Encounter (Signed)
 Please advise [facility]

## 2024-04-10 ENCOUNTER — Telehealth: Payer: Self-pay

## 2024-04-10 ENCOUNTER — Other Ambulatory Visit: Payer: Self-pay | Admitting: Nurse Practitioner

## 2024-04-10 NOTE — Telephone Encounter (Signed)
>>   Apr 10, 2024  8:26 AM Cynthia K wrote: She is out of medication since yesterday. She is checking on when it will be sent to pharmacy. Please message her with any questions through MyChart. I did let her know it could take up to 3 business days to send to her pharmacy.     Copied from CRM #[id number]. Topic: Clinical - Medication Refill >> Apr 06, 2024 12:20 PM Debby BROCKS wrote: Medication: Oxycodone  HCl 20 MG TABS  Patient also wants to refill Zofran  (Ondansetron ) because her old provider cannot provide it anymore.  Has the patient contacted their pharmacy? No (Agent: If no, request that the patient contact the pharmacy for the refill. If patient does not wish to contact the pharmacy document the reason why and proceed with request.) (Agent: If yes, when and what did the pharmacy advise?)  This is the patient's preferred pharmacy:  [facility] - [address], [facility] [place] [address] Phone: [phone] Fax: [phone]  Is this the correct pharmacy for this prescription? Yes If no, delete pharmacy and type the correct one.   Has the prescription been filled recently? No  Is the patient out of the medication? No, But she is running low  Has the patient been seen for an appointment in the last year OR does the patient have an upcoming appointment? Yes  Can we respond through MyChart? Yes  Agent: Please be advised that Rx refills may take up to 3 business days. We ask that you follow-up with your pharmacy.

## 2024-04-11 ENCOUNTER — Other Ambulatory Visit: Payer: Self-pay

## 2024-04-11 MED ORDER — OXYCODONE HCL 20 MG PO TABS: 20.0000 mg | ORAL_TABLET | Freq: Four times a day (QID) | ORAL | 0 refills | Status: DC | PRN

## 2024-04-11 NOTE — Telephone Encounter (Signed)
 Patient states she has been out since Monday, patient requesting this be sent in as soon as possible.

## 2024-04-12 ENCOUNTER — Other Ambulatory Visit: Payer: Self-pay | Admitting: Nurse Practitioner

## 2024-04-12 ENCOUNTER — Telehealth: Payer: Self-pay

## 2024-04-12 MED ORDER — OXYCODONE HCL 20 MG PO TABS
20.0000 mg | ORAL_TABLET | Freq: Four times a day (QID) | ORAL | 0 refills | Status: DC | PRN
Start: 2024-04-12 — End: 2024-04-23

## 2024-04-12 NOTE — Telephone Encounter (Signed)
 Copied from CRM [id number]. Topic: Clinical - Medication Question >> Apr 12, 2024  9:08 AM Gabriella Griffin wrote: Reason for CRM: Patient called in regarding medication Oxycodone  HCl 20 MG TABS , stated she hasn't received her prescription refill . Would like for someone to call pharmacy and reached out regarding this as patient stated she needs her medication

## 2024-04-12 NOTE — Telephone Encounter (Signed)
 Copied from CRM [id number]. Topic: Clinical - Prescription Issue >> Apr 11, 2024  3:16 PM Amy B wrote: Reason for CRM: Pharmacy still has not received refill request for Oxycodone  HCl 20 MG TABS.  Upon looking in chart, there is an error in transmission.  Patient is out of medication.  Please fax Rx.

## 2024-04-23 ENCOUNTER — Other Ambulatory Visit: Payer: Self-pay | Admitting: Nurse Practitioner

## 2024-04-23 NOTE — Telephone Encounter (Unsigned)
 Copied from CRM [id number]. Topic: Clinical - Medication Refill >> Apr 23, 2024  1:09 PM Dawna HERO wrote: Medication:  Oxycodone  HCl 20 MG TABS     Has the patient contacted their pharmacy? Yes,contact provider (Agent: If no, request that the patient contact the pharmacy for the refill. If patient does not wish to contact the pharmacy document the reason why and proceed with request.) (Agent: If yes, when and what did the pharmacy advise?)  This is the patient's preferred pharmacy:  [facility] - [address], [facility] [place] [address] Phone: [phone] Fax: [phone]  Is this the correct pharmacy for this prescription? Yes If no, delete pharmacy and type the correct one.   Has the prescription been filled recently? No  Is the patient out of the medication? Yes  Has the patient been seen for an appointment in the last year OR does the patient have an upcoming appointment? Yes  Can we respond through MyChart? Yes  Agent: Please be advised that Rx refills may take up to 3 business days. We ask that you follow-up with your pharmacy.

## 2024-04-23 NOTE — Telephone Encounter (Signed)
 Please advise [facility]

## 2024-04-25 ENCOUNTER — Ambulatory Visit: Payer: Self-pay

## 2024-04-25 ENCOUNTER — Telehealth: Payer: Self-pay

## 2024-04-25 ENCOUNTER — Other Ambulatory Visit: Payer: Self-pay | Admitting: Nurse Practitioner

## 2024-04-25 MED ORDER — OXYCODONE HCL 20 MG PO TABS: 20.0000 mg | ORAL_TABLET | Freq: Four times a day (QID) | ORAL | 0 refills | Status: DC | PRN

## 2024-04-25 NOTE — Telephone Encounter (Signed)
 Copied from CRM [id number]. Topic: Clinical - Prescription Issue >> Apr 25, 2024  1:16 PM Carlatta H wrote: Reason for CRM: Patient was just discharged from the hospital and wanted a prescription update//

## 2024-04-25 NOTE — ED Triage Notes (Signed)
 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 States she ran out of her pain medication last night. Woke up with sharp stinging pain this morning.

## 2024-04-25 NOTE — Telephone Encounter (Signed)
  Called patient back and she states that she is currently at [facility] She states that she was getting ready for work today but was in so much pain she had to go to the ER close to her job Patient states she will be leaving the ER soon and feels slightly better but she has to get back to her job She was advised by the ER that as far as any prescriptions, she would need to get that from her provider Patient was calling to check and see if her pain medication was sent to the pharmacy She is still at the Emergency Room at this time and she states will call back later if she hasn't heard back from anyone about the prescription for pain medication.  FYI Only or Action Required?: Action required by provider: requesting pain medication refill .  Patient was last seen in primary care on 03/22/2024 by Oley Bascom RAMAN, NP.  Called Nurse Triage reporting Information Only.  Triage Disposition: No disposition on file.  Patient/caregiver understands and will follow disposition?: Yes            Copied from CRM [id number]. Topic: Clinical - Red Word Triage >> Apr 25, 2024 10:26 AM Montie POUR wrote: Red Word that prompted transfer to Nurse Triage:  She is out of her pain medication and her pain level is an 8.  I did not see in chart where medication has been sent to her pharmacy. Reason for Disposition  [1] Caller requesting NON-URGENT health information AND [2] PCP's office is the best resource  Answer Assessment - Initial Assessment Questions Patient states that she is currently at [facility] ER She states that she was getting ready for work today but was in so much pain she had to go to the ER close to her job Patient states she will be leaving the ER soon and feels slightly better but she has to get back to her job She was advised by the ER that as far as any prescriptions, she would need to get that from her provider Patient was calling to check and see if her pain medication was sent  to the pharmacy She is still at the Emergency Room at this time and she states will call back later if she hasn't heard back from anyone about the prescription for pain medication.  Protocols used: Information Only Call - No Triage-A-AH

## 2024-04-25 NOTE — Telephone Encounter (Signed)
 I can not refuse this . Please do so because it was already filled. KH

## 2024-05-08 ENCOUNTER — Other Ambulatory Visit: Payer: Self-pay | Admitting: Nurse Practitioner

## 2024-05-08 DIAGNOSIS — D5700 Hb-SS disease with crisis, unspecified: Secondary | ICD-10-CM

## 2024-05-08 NOTE — Telephone Encounter (Signed)
 Please advise [facility]

## 2024-05-21 ENCOUNTER — Other Ambulatory Visit: Payer: Self-pay | Admitting: Nurse Practitioner

## 2024-05-22 NOTE — Telephone Encounter (Signed)
 Please advise [facility]

## 2024-06-05 ENCOUNTER — Other Ambulatory Visit: Payer: Self-pay | Admitting: Nurse Practitioner

## 2024-06-05 NOTE — Telephone Encounter (Unsigned)
 Copied from CRM [id number]. Topic: Clinical - Medication Refill >> Jun 05, 2024  1:37 PM Mia F wrote: Medication: Oxycodone  HCl 20 MG TABS   Has the patient contacted their pharmacy? No (Agent: If no, request that the patient contact the pharmacy for the refill. If patient does not wish to contact the pharmacy document the reason why and proceed with request.) (Agent: If yes, when and what did the pharmacy advise?)  This is the patient's preferred pharmacy:  [facility] - [address], [facility] [place] [address] Phone: [phone] Fax: [phone]  Is this the correct pharmacy for this prescription? Yes If no, delete pharmacy and type the correct one.   Has the prescription been filled recently? Yes  Is the patient out of the medication? No  Has the patient been seen for an appointment in the last year OR does the patient have an upcoming appointment? Yes  Can we respond through MyChart? Yes  Agent: Please be advised that Rx refills may take up to 3 business days. We ask that you follow-up with your pharmacy.

## 2024-06-06 NOTE — Telephone Encounter (Signed)
 Please advise [facility]

## 2024-06-07 ENCOUNTER — Telehealth: Payer: Self-pay

## 2024-06-07 NOTE — Discharge Summary (Signed)
 [facility]  Discharge Summary  Goals of Care  Full Code   PCP: No primary care provider on file. Discharge Details   Admit date:         06/03/2024 Discharge date and time:       06/08/2024  Hospital LOS:    5 days  Active Hospital Problems   Diagnosis Date Noted POA  . *Sickle cell crisis (*) 12/29/2022 Yes  . Parainfluenza infection 06/03/2024 Yes  . Viral pneumonia 06/03/2024 Yes  . Mild intermittent asthma with acute exacerbation (*) 06/03/2024 Yes  . 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 Hypokalemia 10/23/2022 Yes    Resolved Hospital Problems   Diagnosis Date Noted Date Resolved POA  . Sickle cell crisis (*) 01/27/2024 03/07/2024 Yes      Current Discharge Medication List     CONTINUE these medications which have CHANGED      Details  DULoxetine HCl 60 mg capsule Commonly known as: CYMBALTA What changed:  medication strength how much to take when to take this  Take one capsule (60 mg dose) by mouth daily for 30 days. Quantity: 30 capsule   ondansetron  4 mg disintegrating tablet Commonly known as: ZOFRAN -ODT What changed: Another medication with the same name was removed. Continue taking this medication, and follow the directions you see here.  Take one tablet (4 mg dose) by mouth every 8 (eight) hours as needed. Quantity: 20 tablet       CONTINUE these medications which have NOT CHANGED      Details  acetaminophen  500 mg tablet Commonly known as: TYLENOL   Take two tablets (1,000 mg dose) by mouth every 6 (six) hours as needed for Pain.   albuterol  sulfate HFA 108 (90 Base) MCG/ACT inhaler Commonly known as: PROVENTIL ,VENTOLIN ,PROAIR   Inhale two puffs into the lungs every 6 (six) hours as needed for Wheezing. Quantity: 8 g   ergocalciferol  50,000 units Caps capsule Commonly known as: Vitamin D2  Take one capsule (50,000 Units dose) by mouth once a week.  MONDAYS   folic acid  1 mg tablet  Take one tablet (1 mg dose) by mouth every morning.   hydroxyurea  500 MG capsule Commonly known as: HYDREA   Take two capsules (1,000 mg dose) by mouth every morning.   naloxone  nasal spray (TAKE HOME PACK) Commonly known as: NARCAN   one spray by Intranasal route once as needed for Opioid Reversal.   Oxycodone  HCl 20 MG Tabs immediate release tablet  Take one tablet (20 mg dose) by mouth every 6 (six) hours.   oxyCODONE -acetaminophen  10-325 mg per tablet Commonly known as: PERCOCET  Take one tablet by mouth every 4 (four) hours as needed for Pain for up to 10 days. Max Daily Amount: 6 tablets Quantity: 20 tablet   vitamin B-12 1000 mcg tablet Commonly known as: CYANOCOBALAMIN  Take one tablet (1,000 mcg dose) by mouth every morning.      * You might also be taking other medications not listed above. If you have questions about any of your other medications, talk to the person who prescribed them or your Primary Care Provider.             Wound care     Diet:      Dietary Orders  (From admission, onward)           Start     Ordered   06/03/24 0812  Regular Diet  Diet effective  now       Process Instructions: No data recorded      06/03/24 0813            Medication Adjustments, D/C instructions and follow up needs   -Continue to follow-up with your hematologist on November 7.  Please ask them to make a referral to lung doctor for breathing tests as you had different episodes of wheezing over the years. -Albuterol  with refills provided. -Cymbalta dose was increased here.  Continue to take at home to see if this helps with the pain. - Zofran  refill provided. - Continue current regimen for sickle cell disease and follow-up with sickle cell clinic at [facility].  -If you have any difficulty getting your medications at discharge or any questions about your hospitalization feel free to contact the Hospitalist group by  calling our answering service at [phone] and leave a message.  Recommendations to physicians/Staff, pending labs or incidental findings   - PFTs when at baseline: I offered referral again to her local pulmonologist but she declined.  Wants to go through her hematologist instead.  Pending labs:   [employer] Current Status   Culture, Blood Blood Vein Preliminary result   Culture, Blood Blood Vein Preliminary result   Prepare RBC, 2 Units, Sickle Cell (Hgb S) Negative Preliminary result         Hospital Course   Summary:Gabriella Griffin is a 33 y.o. female  with past medical history of hemoglobin SS disease, asthma. Patient is followed by hematology at [facility]. She has had multiple presentation in the [facility] system with multiple ED visits. She has been hospitalized here 10 times in 2024. Patient reported at least 50% of hospitalizations are related to crisis precipitated by her period. Since the beginning of the year she has been admitted twice in January, twice in February, twice in March, once in April, 3 times in May, once in June, twice in July and once in September. Hemoglobin electrophoresis from 2012: HB-A Not Detected  HB-A2 2.2 1.5-3.0 %  HB-F 30.1 (H) 0.0-3.0 %  HB-S 67.7 (H) 0 %    Baseline Hgb: 9.5 g/dl Baseline WBC: 88,999 Baseline reticulocyte count: 5-10%    - Database check   - She is now followed in the [facility] clinic.  Last seen in August.  Gabriella Griffin.     Admitted from the emergency room October 19 Patient presented to the emergency department with complaint of wheezing, shortness of breath, cough, sore throat.  She reported that her children have also had similar symptoms.  She reports its unrelieved pain related to her sickle cell in the setting of upper respiratory illness.  She also reported nausea.  She denies fever, abdominal pain, vomiting, diarrhea, dysuria.  She was brought by EMS and required nebulizer treatment in  transfer although usually does not use bronchodilators.  Of note is that she did get a prescription for albuterol  during previous episode of upper respiratory infection but she had misplaced it.  She reports severe bone pain similar to previous sickle cell crisis likely triggered by the respiratory infection.  She also reports severe nausea and decreased oral intake.  She denies any vomiting or diarrhea. Workup in the emergency department showed: White blood cell 16.3, hemoglobin 7.4 with most recent 1 of 7.93 days ago, platelet count 473.  Reticulocyte count was 14. Pregnancy test was negative.  Abbreviated COVID panel was negative.  Lactic acid was 0.7.  Procalcitonin was 0.12.  Urinalysis not consistent with urinary tract infection. CMP showed potassium 2.9, creatinine 0.4, bilirubin of 5.1 with most recent readings between 3 and 4 AST was 45 with rest of the liver function test being normal.   CT pulmonary angiogram showed: IMPRESSION: 1. No evidence for pulmonary embolism. 2. Nonspecific bilateral groundglass opacity especially in the lower lobes as well as bilateral diffuse bronchial wall thickening. These findings could reflect inflammatory/infectious etiology versus edema.   Treatment in the emergency department included cefepime, linezolid, DuoNeb, Benadryl , methylprednisolone 125 mg, Zofran , 10 mill equivalents of intravenous potassium chloride . She received 1 dose of hydromorphone  1.5 mg.   -Admitted for pain symptom management and also management of wheezing. -Patient was transfused blood. -She did subsequently have full respiratory [employer] panel done which was positive for parainfluenza. -Given brief course of doxycycline . -Lung changes were felt to be related to the parainfluenza which also contributed to her wheezing.  Seems to be prone to wheezing when she has respiratory infections.  Seems like everything was set up by the parainfluenza virus.  Respiratory status improved.  Pain  overall improved.  Was getting pretty high doses of Dilaudid  2 mg every 3 hours IV.  In the future would try to avoid this dosing.  She usually can tolerate oral oxycodone  with just Dilaudid  with a longer frequency for breakthrough.  We have made referrals to her for GI and pulmonary in the past but she has not followed up.  Will refill her Zofran  and her inhaler.  Try to put another in referral for pulmonary as she seems to be prone to wheezing with respiratory infections but has never had any full breathing test.  She will follow-up at [facility] for routine narcotic prescription and her sickle cell follow-up.  In general she is feeling much better from a respiratory perspective and desires discharge home.  - Future admissions would consider oxycodone  20 oral every 4 hours as needed with Dilaudid  2 mg IV every 8 hours as needed.     Assessment  Plan  Parainfluenza pneumonia with bronchospasm. -Respiratory issues improved nicely. - PFTs when at baseline: I offered referral again to her local pulmonologist but she declined.  Wants to go through her hematologist instead.  Sickle cell pain crisis/anemia secondary hemolysis Transfused 2 units of packed red cells since the patient reports severe symptoms and hemoglobin down to 7. - Weaning regimen in anticipation of going home.       Physical Exam     Constitutional - resting comfortably, no acute distress Eyes - pupils equal round  Nose - no gross deformity or drainage Mouth - no oral lesions noted Throat - no swelling or erythema     CV - (+)S1S2, no murmurs; no peripheral edema,  Resp -occasional inspiratory squeak but no expiratory wheezes. GI - (+)BS, soft, non-tender, non-distended MSK - ROM normal   Skin - no rashes or wounds Neuro - alert, aware, oriented to person/place/time  Psych - normal affect and mood        Vital signs in last 24 hours: Intake/Output:  Temp:  [98.7 F (37.1 C)-99.3 F (37.4 C)] 98.7 F (37.1 C) Heart  Rate:  [71-95] 71 Resp:  [16-18] 18 BP: (104-130)/(68-84) 104/68 SpO2:  [94 %-97 %] 97 %   Wt Readings from Last 1 Encounters:  06/05/24 55 kg (121 lb 4.1 oz)     Intake/Output Summary (Last 24 hours) at 06/08/2024 0845 Last data filed at 06/08/2024 0604 Gross per 24 hour  Intake 740  ml  Output 1375 ml  Net -635 ml      Labs- Procedures-Imaging   Bedside Procedures   No orders found     Labs on Discharge:  Recent Labs    Units 06/08/24 0119 06/06/24 0138 06/05/24 0107 06/04/24 0256 06/03/24 1450 06/03/24 0314  WBC thou/mcL 7.7 7.2 8.8 10.5  --  16.3*  HGB gm/dL 87.8 88.7 89.7* 7.0* 7.0* 7.4*  HCT % 34.8 32.3* 29.8* 20.1* 19.6* 20.9*  PLT thou/mcL 438* 427* 426* 423*  --  473*    Recent Labs    Units 06/08/24 0118 06/06/24 0138 06/05/24 0107 06/04/24 0256 06/03/24 1449 06/03/24 0314  NA mmol/L 137 139 140 139 140 142  K mmol/L 3.8 3.9 4.3 4.4 4.5 2.9*  CL mmol/L 100 102 104 108 108 107  CO2 mmol/L 24 25 24 22 20 23   BUN mg/dL 11 9 3* 4* 5* 8  CREATININE mg/dL 9.69* 9.59* 9.69* 9.62* 0.30* 0.40*  CALCIUM mg/dL 9.3 9.2 9.0 8.7 8.8 8.7   No results for input(s): TSH, HBA1C in the last 168 hours. Recent Labs    Units 06/08/24 0118 06/06/24 0138 06/05/24 0107 06/04/24 0256 06/03/24 0314  BILITOT mg/dL 4.8* 3.8* 3.3* 3.1* 5.1*  AST U/L 36 41* 41* 37 45*  ALT U/L 23 31 25 20 24   ALKPHOS U/L 116 128 84 67 85  ALBUMIN gm/dL 4.6 4.3 4.3 4.1 4.5   No results for input(s): LABPROT, INR, PTT in the last 168 hours.  No results for input(s): CHOL, LDL, HDL, TRIG in the last 168 hours.    [facility]   Discharge Procedure Orders  Regular Diet   Notify Physician for trouble breathing or symptoms that are worse   Activity as tolerated   Discharge instructions  Order Comments: -Continue to follow-up with your hematologist on November 7.  Please ask them to make a referral to lung doctor for breathing tests as you had different  episodes of wheezing over the years. -Albuterol  with refills provided. -Cymbalta dose was increased here.  Continue to take at home to see if this helps with the pain. - Zofran  refill provided. - Continue current regimen for sickle cell disease and follow-up with sickle cell clinic at [facility].     Disposition:     home Consults:     NA  Followup appointments: No future appointments.  Appointment(s) requested via Answering service.  Time spent in discharge process:   35 min   Electronically signed: Lonni JINNY Ellen, MD 06/08/2024 / 8:45 AM  *Some images could not be shown.

## 2024-06-07 NOTE — Telephone Encounter (Signed)
 Copied from CRM [id number]. Topic: Clinical - Medication Refill >> Jun 05, 2024  1:37 PM Mia F wrote: Medication: Oxycodone  HCl 20 MG TABS   Has the patient contacted their pharmacy? No (Agent: If no, request that the patient contact the pharmacy for the refill. If patient does not wish to contact the pharmacy document the reason why and proceed with request.) (Agent: If yes, when and what did the pharmacy advise?)  This is the patient's preferred pharmacy:  [facility] - [place] [place], [facility] [place] [address] Phone: [phone] Fax: [phone]  Is this the correct pharmacy for this prescription? Yes If no, delete pharmacy and type the correct one.   Has the prescription been filled recently? Yes  Is the patient out of the medication? No  Has the patient been seen for an appointment in the last year OR does the patient have an upcoming appointment? Yes  Can we respond through MyChart? Yes  Agent: Please be advised that Rx refills may take up to 3 business days. We ask that you follow-up with your pharmacy. >> Jun 07, 2024 11:20 AM Mia F wrote: Pt called to check the status of medication. Pt was advised med refills can take up to 3 business days

## 2024-06-08 ENCOUNTER — Telehealth: Payer: Self-pay | Admitting: Nurse Practitioner

## 2024-06-08 MED ORDER — OXYCODONE HCL 20 MG PO TABS: 20.0000 mg | ORAL_TABLET | Freq: Four times a day (QID) | ORAL | 0 refills | Status: DC | PRN

## 2024-06-08 NOTE — Telephone Encounter (Signed)
 Copied from CRM [id number]. Topic: Clinical - Prescription Issue >> Jun 08, 2024 11:26 AM Gabriella Griffin wrote: Reason for CRM: Patient is calling because the pharmacy closes on  the weekend and she's out of pain meds. Please advise. Oxycodone  HCl 20 MG TABS

## 2024-06-11 ENCOUNTER — Telehealth: Payer: Self-pay

## 2024-06-11 NOTE — Transitions of Care (Post Inpatient/ED Visit) (Signed)
   06/11/2024  Name: Gabriella Griffin MRN: [id number] DOB: February 18, 1991  Today's TOC FU Call Status: Today's TOC FU Call Status:: Unsuccessful Call (1st Attempt) Unsuccessful Call (1st Attempt) Date: 06/11/24  Attempted to reach the patient regarding the most recent Inpatient/ED visit.  Follow Up Plan: Additional outreach attempts will be made to reach the patient to complete the Transitions of Care (Post Inpatient/ED visit) call.   Arvin Seip RN, BSN, CCM [employer], Population Health Case Manager Phone: [phone]

## 2024-06-12 ENCOUNTER — Telehealth: Payer: Self-pay

## 2024-06-12 NOTE — Telephone Encounter (Signed)
 Done on 06/08/24

## 2024-06-12 NOTE — Transitions of Care (Post Inpatient/ED Visit) (Signed)
   06/12/2024  Name: Aden Youngman MRN: [id number] DOB: 1991-06-13  Today's TOC FU Call Status: Today's TOC FU Call Status:: Unsuccessful Call (2nd Attempt) Unsuccessful Call (2nd Attempt) Date: 06/12/24  Attempted to reach the patient regarding the most recent Inpatient/ED visit.  Follow Up Plan: Additional outreach attempts will be made to reach the patient to complete the Transitions of Care (Post Inpatient/ED visit) call.   Arvin Seip RN, BSN, CCM [employer], Population Health Case Manager Phone: [phone]

## 2024-06-13 ENCOUNTER — Telehealth: Payer: Self-pay

## 2024-06-13 NOTE — Transitions of Care (Post Inpatient/ED Visit) (Signed)
   06/13/2024  Name: Gabriella Griffin MRN: [id number] DOB: [birth year]  Today's TOC FU Call Status: Today's TOC FU Call Status:: Unsuccessful Call (3rd Attempt) Unsuccessful Call (3rd Attempt) Date: 06/13/24  Attempted to reach the patient regarding the most recent Inpatient/ED visit.  Follow Up Plan: No further outreach attempts will be made at this time. We have been unable to contact the patient.  Arvin Seip RN, BSN, CCM [employer], Population Health Case Manager Phone: [phone]

## 2024-06-22 ENCOUNTER — Ambulatory Visit: Payer: Self-pay | Admitting: Nurse Practitioner

## 2024-06-22 ENCOUNTER — Ambulatory Visit: Payer: Self-pay

## 2024-06-22 ENCOUNTER — Telehealth: Payer: Self-pay | Admitting: Nurse Practitioner

## 2024-06-22 ENCOUNTER — Telehealth: Payer: Self-pay

## 2024-06-22 ENCOUNTER — Encounter: Payer: Self-pay | Admitting: Nurse Practitioner

## 2024-06-22 VITALS — BP 116/69 | HR 72 | Ht 62.0 in | Wt 111.0 lb

## 2024-06-22 DIAGNOSIS — D5700 Hb-SS disease with crisis, unspecified: Secondary | ICD-10-CM | POA: Diagnosis not present

## 2024-06-22 MED ORDER — OXYCODONE HCL 20 MG PO TABS: 20.0000 mg | ORAL_TABLET | Freq: Four times a day (QID) | ORAL | 0 refills | Status: DC | PRN

## 2024-06-22 MED ORDER — PROMETHAZINE-DM 6.25-15 MG/5ML PO SYRP: 5.0000 mL | ORAL_SOLUTION | Freq: Four times a day (QID) | ORAL | 0 refills | Status: AC | PRN

## 2024-06-22 MED ORDER — HYDROXYUREA 500 MG PO CAPS: 500.0000 mg | ORAL_CAPSULE | Freq: Two times a day (BID) | ORAL | 1 refills | Status: AC

## 2024-06-22 MED ORDER — VITAMIN D (ERGOCALCIFEROL) 1.25 MG (50000 UNIT) PO CAPS: 50000.0000 [IU] | ORAL_CAPSULE | ORAL | 2 refills | Status: AC

## 2024-06-22 MED ORDER — VITAMIN B-12 1000 MCG PO TABS: 1000.0000 ug | ORAL_TABLET | Freq: Every day | ORAL | 2 refills | Status: AC

## 2024-06-22 MED ORDER — FOLIC ACID 1 MG PO TABS: 2.0000 mg | ORAL_TABLET | Freq: Every day | ORAL | 3 refills | Status: AC

## 2024-06-22 NOTE — Telephone Encounter (Unsigned)
 Copied from CRM #[id number]. Topic: Clinical - Medication Refill >> Jun 22, 2024  4:33 PM Kevelyn M wrote: Medication: Zofran   Has the patient contacted their pharmacy? No (Agent: If no, request that the patient contact the pharmacy for the refill. If patient does not wish to contact the pharmacy document the reason why and proceed with request.) (Agent: If yes, when and what did the pharmacy advise?)  This is the patient's preferred pharmacy:  [facility] - [place], [facility] [place] [address] Phone: [phone] Fax: [phone]  Is this the correct pharmacy for this prescription? Yes If no, delete pharmacy and type the correct one.   Has the prescription been filled recently? No  Is the patient out of the medication? No  Has the patient been seen for an appointment in the last year OR does the patient have an upcoming appointment? Yes  Can we respond through MyChart? Yes  Agent: Please be advised that Rx refills may take up to 3 business days. We ask that you follow-up with your pharmacy.

## 2024-06-22 NOTE — Telephone Encounter (Signed)
 FYI Only or Action Required?: Action required by provider: new RX for zofran  4 mg ODT.  Patient was last seen in primary care on 06/22/2024 by Oley Bascom RAMAN, NP.  Called Nurse Triage reporting Medication Refill.    Triage Disposition: See PCP When Office is Open (Within 3 Days)  Patient/caregiver understands and will follow disposition?: Yes

## 2024-06-22 NOTE — Telephone Encounter (Signed)
 Requested medication, Zofran , not on current medication list. Please see recent NT encounter.

## 2024-06-22 NOTE — Telephone Encounter (Unsigned)
 Copied from CRM [id number]. Topic: Clinical - Prescription Issue >> Jun 22, 2024  3:49 PM Tiffini S wrote: Reason for CRM: Patient was admitted into the hospital for flu like symptoms- ED provider suggested promethazine  with codeine syrup   She was prescribed promethazine -dextromethorphan (PROMETHAZINE -DM) 6.25-15 MG/5ML syrup- patient called and asked to please change medication   Please sent to  [facility] - [place] [place], [place] [facility] [place] [address] Phone: [phone] Fax: [phone]  Called [facility], spoke with Melia- will send message

## 2024-06-22 NOTE — Telephone Encounter (Signed)
 Requested medication, Zofran , not on current medication list. This RN made first attempt to triage patient. No answer, LVM. Routing for additional attempts.   Copied from CRM #[id number]. Topic: Clinical - Medication Refill >> Jun 22, 2024  4:33 PM Kevelyn M wrote: Medication: Zofran    Has the patient contacted their pharmacy? No (Agent: If no, request that the patient contact the pharmacy for the refill. If patient does not wish to contact the pharmacy document the reason why and proceed with request.) (Agent: If yes, when and what did the pharmacy advise?)   This is the patient's preferred pharmacy:  [facility] - [place], [facility] [place] [address] Phone: [phone] Fax: [phone]   Is this the correct pharmacy for this prescription? Yes If no, delete pharmacy and type the correct one.    Has the prescription been filled recently? No   Is the patient out of the medication? No   Has the patient been seen for an appointment in the last year OR does the patient have an upcoming appointment? Yes   Can we respond through MyChart? Yes   Agent: Please be advised that Rx refills may take up to 3 business days. We ask that you follow-up with your pharmacy.

## 2024-06-22 NOTE — Progress Notes (Signed)
 Subjective   Patient ID: Gabriella Griffin, female    DOB: [birth year], 33 y.o.   MRN: [id number]  Chief Complaint  Patient presents with   Follow-up    Referring provider: No ref. provider found  Gabriella Griffin is a 33 y.o. female with Past Medical History: No date: Sickle cell anemia (HCC) No date: Sickle cell disease (HCC)   HPI  Patient presents today for a follow-up visit.  She does have a history of sickle cell was previously seen through [facility].  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  Overall stable today.  Will check labs today. Denies f/c/s, n/v/d, hemoptysis, PND, leg swelling Denies chest pain or edema.     Allergies  Allergen Reactions   Vancomycin Dermatitis    WAS RECIEVING AZITHROMYCIN  AND VANCOMYCIN AT THE SAME TIME - unable to determine which was the causative agent. Likely only allergic to one.   Rash to her IV site that spread up her vein and she reports vein appear purple-ish. She did have itching to her left arm and head along with feeling anxious. She was given IV benadryl  with relief.  WAS RECIEVING AZITHROMYCIN  AND VANCOMYCIN AT THE SAME TIME - unable to determine which was the causative agent. Likely only allergic to one.     Rash to her IV site that spread up her vein and she reports vein appear purple-ish. She did have itching to her left arm and head along with feeling anxious. She was given IV benadryl  with relief.   Azithromycin  Rash   Ceftin [Cefuroxime Axetil] Rash   Fentanyl  Rash    Immunization History  Administered Date(s) Administered   Influenza,inj,Quad PF,6+ Mos 06/14/2015, 06/27/2017   Pneumococcal Polysaccharide-23 07/11/2017   Tdap 08/06/2015    Tobacco History: Social History   Tobacco Use  Smoking Status Never  Smokeless Tobacco Never   Counseling given: Not Answered   Outpatient Encounter Medications as of 06/22/2024  Medication Sig   DULoxetine (CYMBALTA) 30 MG capsule Take 30 mg by mouth.    promethazine -dextromethorphan (PROMETHAZINE -DM) 6.25-15 MG/5ML syrup Take 5 mLs by mouth 4 (four) times daily as needed for cough.   [DISCONTINUED] cyanocobalamin (VITAMIN B12) 1000 MCG tablet Take 1,000 mcg by mouth.   [DISCONTINUED] folic acid  (FOLVITE ) 1 MG tablet Take 2 tablets (2 mg total) by mouth daily.   [DISCONTINUED] hydroxyurea  (HYDREA ) 500 MG capsule Take 1 capsule (500 mg total) by mouth 2 (two) times daily. May take with food to minimize GI side effects.   [DISCONTINUED] Oxycodone  HCl 20 MG TABS Take 1 tablet (20 mg total) by mouth every 6 (six) hours as needed.   [DISCONTINUED] Oxycodone  HCl 20 MG TABS Take 1 tablet (20 mg total) by mouth every 6 (six) hours as needed.   [DISCONTINUED] Vitamin D , Ergocalciferol , (DRISDOL ) 1.25 MG (50000 UNIT) CAPS capsule Take 1 capsule (50,000 Units total) by mouth every 7 (seven) days.   cyanocobalamin (VITAMIN B12) 1000 MCG tablet Take 1 tablet (1,000 mcg total) by mouth daily.   escitalopram  (LEXAPRO ) 5 MG tablet Take 1 tablet (5 mg total) by mouth daily. (Patient not taking: Reported on 11/23/2017)   folic acid  (FOLVITE ) 1 MG tablet Take 2 tablets (2 mg total) by mouth daily.   HYDROmorphone  (DILAUDID ) 2 MG tablet Take 1 tablet (2 mg total) by mouth every 4 (four) hours as needed for severe pain. (Patient taking differently: Take 2 mg by mouth every 6 (six) hours as needed for severe pain (pain score 7-10).)   hydroxyurea  (  HYDREA ) 500 MG capsule Take 1 capsule (500 mg total) by mouth 2 (two) times daily. May take with food to minimize GI side effects.   naproxen  sodium (ALEVE ) 220 MG tablet Take 220 mg by mouth daily as needed (pain).   Oxycodone  HCl 20 MG TABS Take 1 tablet (20 mg total) by mouth every 6 (six) hours as needed.   Oxycodone  HCl 20 MG TABS Take 1 tablet (20 mg total) by mouth every 6 (six) hours as needed.   Vitamin D , Ergocalciferol , (DRISDOL ) 1.25 MG (50000 UNIT) CAPS capsule Take 1 capsule (50,000 Units total) by mouth every 7  (seven) days.   No facility-administered encounter medications on file as of 06/22/2024.    Review of Systems  Review of Systems  Constitutional: Negative.   HENT: Negative.    Cardiovascular: Negative.   Gastrointestinal: Negative.   Allergic/Immunologic: Negative.   Neurological: Negative.   Psychiatric/Behavioral: Negative.       Objective:   BP 116/69   Pulse 72   Ht 5' 2 (1.575 m)   Wt 111 lb (50.3 kg)   SpO2 97%   BMI 20.30 kg/m   Wt Readings from Last 5 Encounters:  06/22/24 111 lb (50.3 kg)  03/22/24 114 lb 12.8 oz (52.1 kg)  01/16/24 110 lb 3.7 oz (50 kg)  12/22/23 111 lb (50.3 kg)  11/25/17 121 lb 4.1 oz (55 kg)     Physical Exam Vitals and nursing note reviewed.  Constitutional:      General: She is not in acute distress.    Appearance: She is well-developed.  Cardiovascular:     Rate and Rhythm: Normal rate and regular rhythm.  Pulmonary:     Effort: Pulmonary effort is normal.     Breath sounds: Normal breath sounds.  Neurological:     Mental Status: She is alert and oriented to person, place, and time.       Assessment & Plan:   Sickle-cell disease with pain (HCC) -     Sickle Cell Panel -     ToxAssure Flex 15, Ur -     oxyCODONE  HCl; Take 1 tablet (20 mg total) by mouth every 6 (six) hours as needed.  Dispense: 60 tablet; Refill: 0  Sickle cell pain crisis (HCC) -     Hydroxyurea ; Take 1 capsule (500 mg total) by mouth 2 (two) times daily. May take with food to minimize GI side effects.  Dispense: 180 capsule; Refill: 1  Other orders -     Folic Acid ; Take 2 tablets (2 mg total) by mouth daily.  Dispense: 60 tablet; Refill: 3 -     Vitamin D  (Ergocalciferol ); Take 1 capsule (50,000 Units total) by mouth every 7 (seven) days.  Dispense: 5 capsule; Refill: 2 -     Vitamin B-12; Take 1 tablet (1,000 mcg total) by mouth daily.  Dispense: 90 tablet; Refill: 2 -     Promethazine -DM; Take 5 mLs by mouth 4 (four) times daily as needed for  cough.  Dispense: 118 mL; Refill: 0     Return in about 3 months (around 09/22/2024).   Bascom GORMAN Borer, NP 06/22/2024

## 2024-06-22 NOTE — Telephone Encounter (Signed)
 Reason for Disposition . Prescription request for new medicine (not a refill)  Answer Assessment - Initial Assessment Questions 1. NAME of MEDICINE: What medicine(s) are you calling about?     zofran  2. QUESTION: What is your question? (e.g., double dose of medicine, side effect)     Patient routinely gets zofran  prescribed with pain medication and forgot to mention when she saw Dr. Oley today.   3. PRESCRIBER: Who prescribed the medicine? Reason: if prescribed by specialist, call should be referred to that group.     Previously prescribed by [facility] Lauraine Hoyle at [facility] 4. SYMPTOMS: Do you have any symptoms? If Yes, ask: What symptoms are you having?  How bad are the symptoms (e.g., mild, moderate, severe)     Not having symptoms at this time.  But takes with her pain medication as the pain medication nauseates her  Normally takes Zofran  4mg  ODT 2-3 times per day.  Protocols used: Medication Question Call-A-AH

## 2024-06-22 NOTE — Telephone Encounter (Signed)
 Copied from CRM [id number]. Topic: Clinical - Prescription Issue >> Jun 22, 2024  4:37 PM Kevelyn M wrote: Reason for CRM: Patient calling in for the status of the cough syrup (PROMETHAZINE -DM) 6.25-15 MG/5ML syrup- patient called and asked to please change medication. She needs the one with codeine.

## 2024-06-23 LAB — CMP14+CBC/D/PLT+FER+RETIC+V...
ALT: 15 IU/L (ref 0–32)
AST: 40 IU/L (ref 0–40)
Albumin: 4.7 g/dL (ref 3.9–4.9)
Alkaline Phosphatase: 80 IU/L (ref 41–116)
BUN/Creatinine Ratio: 20 (ref 9–23)
BUN: 11 mg/dL (ref 6–20)
Basophils Absolute: 0.1 x10E3/uL (ref 0.0–0.2)
Basos: 1 %
Bilirubin Total: 3.5 mg/dL — ABNORMAL HIGH (ref 0.0–1.2)
CO2: 22 mmol/L (ref 20–29)
Calcium: 9.4 mg/dL (ref 8.7–10.2)
Chloride: 100 mmol/L (ref 96–106)
Creatinine, Ser: 0.55 mg/dL — ABNORMAL LOW (ref 0.57–1.00)
EOS (ABSOLUTE): 0.3 x10E3/uL (ref 0.0–0.4)
Eos: 3 %
Ferritin: 611 ng/mL — ABNORMAL HIGH (ref 15–150)
Globulin, Total: 2.9 g/dL (ref 1.5–4.5)
Glucose: 83 mg/dL (ref 70–99)
Hematocrit: 27.2 % — ABNORMAL LOW (ref 34.0–46.6)
Hemoglobin: 9.2 g/dL — ABNORMAL LOW (ref 11.1–15.9)
Immature Grans (Abs): 0 x10E3/uL (ref 0.0–0.1)
Immature Granulocytes: 0 %
Lymphocytes Absolute: 3.5 x10E3/uL — ABNORMAL HIGH (ref 0.7–3.1)
Lymphs: 40 %
MCH: 33.7 pg — ABNORMAL HIGH (ref 26.6–33.0)
MCHC: 33.8 g/dL (ref 31.5–35.7)
MCV: 100 fL — ABNORMAL HIGH (ref 79–97)
Monocytes Absolute: 0.7 x10E3/uL (ref 0.1–0.9)
Monocytes: 8 %
NRBC: 1 % — ABNORMAL HIGH (ref 0–0)
Neutrophils Absolute: 4.2 x10E3/uL (ref 1.4–7.0)
Neutrophils: 48 %
Platelets: 509 x10E3/uL — ABNORMAL HIGH (ref 150–450)
Potassium: 4.5 mmol/L (ref 3.5–5.2)
RBC: 2.73 x10E6/uL — CL (ref 3.77–5.28)
RDW: 20.1 % — ABNORMAL HIGH (ref 11.7–15.4)
Retic Ct Pct: 6.7 % — ABNORMAL HIGH (ref 0.6–2.6)
Sodium: 143 mmol/L (ref 134–144)
Total Protein: 7.6 g/dL (ref 6.0–8.5)
Vit D, 25-Hydroxy: 12.9 ng/mL — ABNORMAL LOW (ref 30.0–100.0)
WBC: 8.7 x10E3/uL (ref 3.4–10.8)
eGFR: 124 mL/min/1.73 (ref >59)

## 2024-06-25 ENCOUNTER — Ambulatory Visit: Payer: Self-pay | Admitting: Nurse Practitioner

## 2024-06-26 NOTE — Telephone Encounter (Signed)
 Was sent In 06/22/24. KH

## 2024-06-27 ENCOUNTER — Other Ambulatory Visit: Payer: Self-pay

## 2024-06-27 ENCOUNTER — Other Ambulatory Visit: Payer: Self-pay | Admitting: Nurse Practitioner

## 2024-06-27 LAB — TOXASSURE FLEX 15, UR
6-ACETYLMORPHINE IA: NEGATIVE ng/mL
7-aminoclonazepam: NOT DETECTED ng/mg{creat}
AMPHETAMINES IA: NEGATIVE ng/mL
Alpha-hydroxyalprazolam: NOT DETECTED ng/mg{creat}
Alpha-hydroxymidazolam: NOT DETECTED ng/mg{creat}
Alpha-hydroxytriazolam: NOT DETECTED ng/mg{creat}
Alprazolam: NOT DETECTED ng/mg{creat}
BARBITURATES IA: NEGATIVE ng/mL
Buprenorphine: NOT DETECTED ng/mg{creat}
CANNABINOIDS IA: NEGATIVE ng/mL
COCAINE METABOLITE IA: NEGATIVE ng/mL
Clonazepam: NOT DETECTED ng/mg{creat}
Creatinine: 67 mg/dL (ref >=20)
Desalkylflurazepam: NOT DETECTED ng/mg{creat}
Desmethyldiazepam: NOT DETECTED ng/mg{creat}
Desmethylflunitrazepam: NOT DETECTED ng/mg{creat}
Diazepam: NOT DETECTED ng/mg{creat}
ETHYL ALCOHOL Enzymatic: NEGATIVE g/dL
Fentanyl: NOT DETECTED ng/mg{creat}
Flunitrazepam: NOT DETECTED ng/mg{creat}
Lorazepam: NOT DETECTED ng/mg{creat}
METHADONE IA: NEGATIVE ng/mL
METHADONE MTB IA: NEGATIVE ng/mL
Midazolam: NOT DETECTED ng/mg{creat}
Norbuprenorphine: NOT DETECTED ng/mg{creat}
Norfentanyl: NOT DETECTED ng/mg{creat}
Oxazepam: NOT DETECTED ng/mg{creat}
PHENCYCLIDINE IA: NEGATIVE ng/mL
TAPENTADOL, IA: NEGATIVE ng/mL
TRAMADOL IA: NEGATIVE ng/mL
Temazepam: NOT DETECTED ng/mg{creat}

## 2024-06-27 LAB — OXYCODONE CLASS, MS, UR RFX
Noroxycodone: NOT DETECTED ng/mg{creat}
Noroxymorphone: NOT DETECTED ng/mg{creat}
Oxycodone Class Confirmation: POSITIVE — AB
Oxycodone: 8051 ng/mg{creat}
Oxymorphone: NOT DETECTED ng/mg{creat}

## 2024-06-27 LAB — OPIATE CLASS, MS, UR RFX
Codeine: NOT DETECTED ng/mg{creat}
Dihydrocodeine: NOT DETECTED ng/mg{creat}
Hydrocodone: NOT DETECTED ng/mg{creat}
Hydromorphone: NOT DETECTED ng/mg{creat}
Morphine: NOT DETECTED ng/mg{creat}
Norcodeine: NOT DETECTED ng/mg{creat}
Norhydrocodone: NOT DETECTED ng/mg{creat}
Normorphine: NOT DETECTED ng/mg{creat}
Opiate Class Confirmation: NEGATIVE

## 2024-06-27 MED ORDER — ONDANSETRON 4 MG PO TBDP: 4.0000 mg | ORAL_TABLET | Freq: Three times a day (TID) | ORAL | 0 refills | Status: DC | PRN

## 2024-07-04 ENCOUNTER — Telehealth: Payer: Self-pay | Admitting: Nurse Practitioner

## 2024-07-04 DIAGNOSIS — D5700 Hb-SS disease with crisis, unspecified: Secondary | ICD-10-CM

## 2024-07-04 NOTE — Telephone Encounter (Signed)
 Copied from CRM [id number]. Topic: Clinical - Medication Refill >> Jul 04, 2024  3:09 PM Zebedee SAUNDERS wrote: Medication: Oxycodone  HCl 20 MG TABS  Has the patient contacted their pharmacy? Yes (Agent: If no, request that the patient contact the pharmacy for the refill. If patient does not wish to contact the pharmacy document the reason why and proceed with request.) (Agent: If yes, when and what did the pharmacy advise?)  This is the patient's preferred pharmacy:  [facility] - [place], [facility] [place] [address] Phone: [phone] Fax: [phone]  Is this the correct pharmacy for this prescription? Yes If no, delete pharmacy and type the correct one.   Has the prescription been filled recently? Yes  Is the patient out of the medication? Yes  Has the patient been seen for an appointment in the last year OR does the patient have an upcoming appointment? Yes  Can we respond through MyChart? Yes  Agent: Please be advised that Rx refills may take up to 3 business days. We ask that you follow-up with your pharmacy.

## 2024-07-05 ENCOUNTER — Other Ambulatory Visit: Payer: Self-pay | Admitting: Nurse Practitioner

## 2024-07-05 ENCOUNTER — Other Ambulatory Visit: Payer: Self-pay

## 2024-07-05 DIAGNOSIS — D5700 Hb-SS disease with crisis, unspecified: Secondary | ICD-10-CM

## 2024-07-05 MED ORDER — OXYCODONE HCL 20 MG PO TABS: 20.0000 mg | ORAL_TABLET | Freq: Four times a day (QID) | ORAL | 0 refills | Status: DC | PRN

## 2024-07-05 NOTE — Telephone Encounter (Signed)
 Copied from CRM [id number]. Topic: Clinical - Prescription Issue >> Jul 05, 2024 11:04 AM Gabriella Griffin wrote: Reason for CRM: Patient calling to inquire about a refill request made on 11-19 for Oxycodone , I informed patient of the 3 bus day protocol. Patient is out of this medication for her pain.

## 2024-07-10 NOTE — Telephone Encounter (Signed)
 Please refuse due to being filled 07/05/24. KH

## 2024-07-10 NOTE — Telephone Encounter (Signed)
 Unable to refuse. Kh

## 2024-07-16 ENCOUNTER — Other Ambulatory Visit: Payer: Self-pay | Admitting: Nurse Practitioner

## 2024-07-16 DIAGNOSIS — D5700 Hb-SS disease with crisis, unspecified: Secondary | ICD-10-CM

## 2024-07-16 MED ORDER — OXYCODONE HCL 20 MG PO TABS: 20.0000 mg | ORAL_TABLET | Freq: Four times a day (QID) | ORAL | 0 refills | Status: DC | PRN

## 2024-07-16 NOTE — Telephone Encounter (Signed)
 Please advise [facility]

## 2024-07-30 ENCOUNTER — Other Ambulatory Visit: Payer: Self-pay | Admitting: Nurse Practitioner

## 2024-07-30 DIAGNOSIS — D5700 Hb-SS disease with crisis, unspecified: Secondary | ICD-10-CM

## 2024-07-31 NOTE — Telephone Encounter (Signed)
 Please advise [facility]

## 2024-08-01 ENCOUNTER — Telehealth: Payer: Self-pay

## 2024-08-01 NOTE — Telephone Encounter (Unsigned)
 Copied from CRM #[id number]. Topic: Clinical - Prescription Issue >> Aug 01, 2024 12:50 PM Kevelyn M wrote: Reason for CRM: Patient calling for Oxycodone  HCl 20 MG TABS and ondansetron  (ZOFRAN -ODT) 4 MG disintegrating tablet refills. A original request was put in on 07/30/2024.  [facility] - [place], [facility] [place] [address]

## 2024-08-02 NOTE — Telephone Encounter (Signed)
 Done [facility]

## 2024-08-16 ENCOUNTER — Other Ambulatory Visit: Payer: Self-pay | Admitting: Nurse Practitioner

## 2024-08-16 DIAGNOSIS — D5700 Hb-SS disease with crisis, unspecified: Secondary | ICD-10-CM

## 2024-08-17 NOTE — Telephone Encounter (Signed)
 Please advise

## 2024-08-17 NOTE — Telephone Encounter (Signed)
 Copied from CRM #[id number]. Topic: Clinical - Prescription Issue >> Aug 17, 2024 10:01 AM Montie POUR wrote: Reason for CRM:  Ms. Igou is out of  Oxycodone  HCl 20 MG TABS and ondansetron  (ZOFRAN -ODT) 4 MG disintegrating tablet  In chart both are showing  08/16/24 1103 Reorder Interface, Surescripts Out To [id number] 08/16/24 1104 Reorder Interface, Surescripts Out To [phone]  Both medications are not showing as going to her pharmacy. Please send refills today so she can pick them up because her pharmacy is closed on the weekend. Please call her with questions at [phone].

## 2024-08-17 NOTE — Telephone Encounter (Signed)
 ondansetron  (ZOFRAN -ODT) 4 MG disintegrating tablet [Pharmacy Med Name: ondansetron  (ZOFRAN -ODT) 4 mg disintegrating tablet]      Oxycodone  HCl 20 MG TABS [Pharmacy Med Name: oxyCODONE  (ROXICODONE ) 20 mg tab immediate release tablet]

## 2024-08-28 ENCOUNTER — Other Ambulatory Visit: Payer: Self-pay | Admitting: Nurse Practitioner

## 2024-08-28 DIAGNOSIS — D5700 Hb-SS disease with crisis, unspecified: Secondary | ICD-10-CM

## 2024-08-28 NOTE — Telephone Encounter (Unsigned)
 Copied from CRM [id number]. Topic: Clinical - Medication Refill >> Aug 28, 2024  4:19 PM Drema MATSU wrote: Medication: Oxycodone  HCl 20 MG TABS and ondansetron  (ZOFRAN -ODT) 4 MG disintegrating tablet  Has the patient contacted their pharmacy? No (Agent: If no, request that the patient contact the pharmacy for the refill. If patient does not wish to contact the pharmacy document the reason why and proceed with request.) (Agent: If yes, when and what did the pharmacy advise?)  This is the patient's preferred pharmacy:  [facility] - [place], [facility] [place] [address] Phone: [phone] Fax: [phone]  Is this the correct pharmacy for this prescription? Yes If no, delete pharmacy and type the correct one.   Has the prescription been filled recently? Yes  Is the patient out of the medication? No  Has the patient been seen for an appointment in the last year OR does the patient have an upcoming appointment? Yes  Can we respond through MyChart? Yes  Agent: Please be advised that Rx refills may take up to 3 business days. We ask that you follow-up with your pharmacy.

## 2024-08-29 MED ORDER — ONDANSETRON 4 MG PO TBDP: 4.0000 mg | ORAL_TABLET | Freq: Three times a day (TID) | ORAL | 0 refills | Status: AC | PRN

## 2024-08-29 MED ORDER — OXYCODONE HCL 20 MG PO TABS: 20.0000 mg | ORAL_TABLET | Freq: Four times a day (QID) | ORAL | 0 refills | Status: AC | PRN

## 2024-08-29 NOTE — Telephone Encounter (Signed)
 Please advise [facility]

## 2024-09-12 ENCOUNTER — Other Ambulatory Visit: Payer: Self-pay

## 2024-09-12 DIAGNOSIS — D5700 Hb-SS disease with crisis, unspecified: Secondary | ICD-10-CM

## 2024-09-12 NOTE — Telephone Encounter (Signed)
 Copied from CRM #[id number]. Topic: Clinical - Medication Refill >> Sep 12, 2024  2:10 PM Emylou G wrote: Medication: Oxycodone  HCl 20 MG TABS ondansetron  (ZOFRAN -ODT) 4 MG disintegrating tablet   Has the patient contacted their pharmacy? No (Agent: If no, request that the patient contact the pharmacy for the refill. If patient does not wish to contact the pharmacy document the reason why and proceed with request.) (Agent: If yes, when and what did the pharmacy advise?)  This is the patient's preferred pharmacy:  [facility] - [place], [facility] [place] [address] Phone: [phone] Fax: [phone]  Is this the correct pharmacy for this prescription? Yes If no, delete pharmacy and type the correct one.   Has the prescription been filled recently? Yes  Is the patient out of the medication? Yes  Has the patient been seen for an appointment in the last year OR does the patient have an upcoming appointment? Yes  Can we respond through MyChart? yes  Agent: Please be advised that Rx refills may take up to 3 business days. We ask that you follow-up with your pharmacy.  Please Advise.

## 2024-09-13 MED ORDER — OXYCODONE HCL 20 MG PO TABS: 20.0000 mg | ORAL_TABLET | Freq: Four times a day (QID) | ORAL | 0 refills | Status: AC | PRN

## 2024-09-13 MED ORDER — ONDANSETRON 4 MG PO TBDP: 4.0000 mg | ORAL_TABLET | Freq: Three times a day (TID) | ORAL | 0 refills | Status: AC | PRN

## 2024-09-13 NOTE — Discharge Summary (Signed)
 [facility]  [facility] Inpatient Discharge Summary  PCP: Bascom GORMAN Borer, FNP Discharge Details   Admit date:         09/10/2024 Discharge date:        09/13/2024  Hospital Days:    3 days  Code Status:   Full Code Advanced Directives on file: No Directive        Discharge Diagnoses:  Active Problems:   Functional asplenia   Task list for follow-up:  Unresulted Labs     Order Current Status   Prepare RBC, 1 Units Preliminary result       Follow-Up Appointments Suggested: No follow-up provider specified. Follow-Up Appointments Already Scheduled: No future appointments.  Discharge Medications:   Current Discharge Medication List     CONTINUE these medications which have NOT CHANGED      Details  acetaminophen  500 mg tablet Commonly known as: TYLENOL   Take two tablets (1,000 mg dose) by mouth every 6 (six) hours as needed for Pain.   albuterol  sulfate HFA 108 (90 Base) MCG/ACT inhaler Commonly known as: PROVENTIL ,VENTOLIN ,PROAIR   Inhale two puffs into the lungs every 6 (six) hours as needed for Wheezing. Quantity: 8 g   ergocalciferol  50,000 units Caps capsule Commonly known as: Vitamin D2  Take one capsule (50,000 Units dose) by mouth once a week. MONDAYS   folic acid  1 mg tablet  Take one tablet (1 mg dose) by mouth every morning.   hydroxyurea  500 MG capsule Commonly known as: HYDREA   Take two capsules (1,000 mg dose) by mouth every morning.   naloxone  nasal spray (TAKE HOME PACK) Commonly known as: NARCAN   one spray by Intranasal route once as needed for Opioid Reversal.   ondansetron  4 mg disintegrating tablet Commonly known as: ZOFRAN -ODT  Take one tablet (4 mg dose) by mouth every 8 (eight) hours as needed. Quantity: 20 tablet   Oxycodone  HCl 20 MG Tabs immediate release tablet  Take one tablet (20 mg dose) by mouth 4 (four) times a day.   vitamin B-12 1,000 mcg tablet Commonly known as: CYANOCOBALAMIN  Take  one tablet (1,000 mcg dose) by mouth every morning.      * You might also be taking other medications not listed above. If you have questions about any of your other medications, talk to the person who prescribed them or your Primary Care Provider.          STOP taking these medications    DULoxetine HCl 60 mg capsule Commonly known as: CYMBALTA   guaiFENesin 100 mg/5 mL Soln Commonly known as: SM TUSSIN MUCUS,ROBITUSSIN        Allergies: Allergies[1]  Consultations this Admission: IP CONSULT TO HOSPITALIST IP CONSULT TO HEM/ONC IP CONSULT TO IV TEAM IP CONSULT TO CASE MANAGEMENT, RN/SW IP CONSULT TO SPIRITUAL CARE IP CONSULT TO IV TEAM  Procedures/Imaging:     XR Chest Ap Portable  Final Result  IMPRESSION:    No focal airspace disease.     Stable cardiomegaly    Electronically Signed by: Cheryle Cork, MD on 09/10/2024 10:50 AM      Pertinent Labs:  Cardiac Labs: No results for input(s): CK, CKMB, CTNI, BNP in the last 168 hours. CBC: Recent Labs    Units 09/13/24 0525 09/12/24 0513 09/11/24 0443 09/10/24 1027  WBC 10e3cells/uL 11.0* 8.0   < > 7.2  HGB gm/dL 8.7* 6.9*   < > 8.2*  PLT 10e3cells/uL 305 314  --  397   < > =  values in this interval not displayed.   BMP: Recent Labs    Units 09/13/24 0525 09/12/24 0514 09/11/24 0443 09/10/24 1027 09/06/24 1254  NA mmol/L 140 140 140 140  --   K mmol/L 3.6* 3.7 3.7 3.4*  --   CL mmol/L 104 107 108 108  --   CO2 mmol/L 23 24 21 23   --   BUN mg/dL 6 7 6 8   --   CREATININE mg/dL 9.66* 9.80* 9.75* 9.79* 0.4  MAGNESIUM  mg/dL  --   --   --  1.9  --    Lipid Panel: No results for input(s): CHOL, TRIG, HDL, LDL in the last 168 hours. Liver Enzymes: Recent Labs    Units 09/13/24 0525 09/12/24 0514 09/11/24 0443 09/10/24 1027  AST IU/L 40 97* 114* 28  ALT IU/L 46* 65* 38 10  ALKPHOS IU/L 149 138 74 68  BILITOT mg/dL 3.7* 6.7* 4.7* 3.3*   Endocrine Panels: Recent Labs     Units 09/13/24 0525 09/12/24 0514 09/11/24 0443 09/10/24 1027  GLUCOSE mg/dL 74 79 96 96    Hospital Course   Physicians involved in care during this hospitalization Attending Provider: Morene LOISE Garrison, DO Attending Provider: Lani CHRISTELLA Headland, MD Attending Provider: Lang MALVA Gails, MD Admitting Provider: Fonda Lex, MD Consulting Physician: [employer] [facility] Consulting Physician: Fonda Lex, MD Consulting Physician: Herlene JINNY Nail, DO Consulting Physician: Suzen MARLA Dade, PA-C  HPI per admitting provider:    Hospital Course:  No notes on file  Gabriella Griffin is a 34 y.o. female with a past medical history of sickle cell anemia, asthma and [facility].  She has had frequent hospitalizations secondary to her sickle cell disease and crises related to that.  She comes to the emergency department with complaints of diffuse pain related to her sickle cell crisis that started this morning.  We have had significant winter weather over the past couple of days and she states that the cold weather has contributed to her pain.  She has pain in her extremities, chest and back.  She denies any fevers or chills or cough.     Workup in the emergency department revealed a normal white blood cell count, hemoglobin of 8.2, reticulocyte count of 17 and LDH of 557.  Her COVID and influenza testing was negative.  Because of her ongoing pain after several doses of Dilaudid  we were asked to consider the patient for admission.  Pt continued to do well and symptoms improved over the course of three days of IVF , Opoid pain control and 1 Unit PRBC  We talked extensively about the new clinical trial and discoveries in the treatment and management of her illness -SCD  She agreed to talk to her case worker who attends a specialty clinic in [place] to see if she may benefit from enrolling at one of the larger academic clinic centers .  She was  being seen at [facility] and stated she and her MD did not get on well so she left .    She goes home to look after her three kids who have been with her sister for the three days of her admission.   Discharging Hg is 8.7g   BP 128/71   Pulse 84   Temp 99.4 F (37.4 C)   Resp 20   Ht 1.575 m (5' 2)   Wt 49.9 kg (110 lb)   SpO2 100%  BMI 20.12 kg/m   Physical Exam Constitutional:      Appearance: Normal appearance.  Cardiovascular:     Rate and Rhythm: Normal rate.  Pulmonary:     Effort: Pulmonary effort is normal.  Musculoskeletal:        General: Normal range of motion.  Skin:    General: Skin is warm.  Neurological:     General: No focal deficit present.     Mental Status: She is alert. Mental status is at baseline.  Psychiatric:        Mood and Affect: Mood normal.        Thought Content: Thought content normal.     Post Hospital Care         Oxygen Orders for Discharge: O2 Device: Nasal cannula SpO2: 100 % O2 Flow Rate (L/min): 2 L/min  Diet: Diet and Nourishment Orders (From admission, onward)     Start       09/10/24 1350  Regular Diet  Diet effective now                   Wound Care Recommendations: None   Lines/Drains/Airways: Patient Lines/Drains/Airways Status     Active LDAs     Name Placement date Placement time Site Days   Peripheral IV 22 G Anterior;Right Forearm 09/12/24  2320  Forearm  less than 1                 Home Health Orders: DME Orders (From admission, onward)    None      Home Health Agency     None       I spent 35 minutes performing discharge services.   Electronically signed: Lang MALVA Gails, MD 09/13/2024 / 8:13 AM      [1] Allergies Allergen Reactions   Azithromycin  Rash    WAS RECIEVING AZITHROMYCIN  AND VANCOMYCIN AT THE SAME TIME - unable to determine which was the causative agent. Likely only allergic to one.  Rash to her IV site that spread up her vein and she reports  vein appear purple-ish. She did have itching to her left arm and head along with feeling anxious. She was given IV benadryl  with relief.   Ceftin [Cefuroxime] Rash   Fentanyl  Rash   Vancomycin Rash    WAS RECIEVING AZITHROMYCIN  AND VANCOMYCIN AT THE SAME TIME - unable to determine which was the causative agent. Likely only allergic to one.   Rash to her IV site that spread up her vein and she reports vein appear purple-ish. She did have itching to her left arm and head along with feeling anxious. She was given IV benadryl  with relief.    *Some images could not be shown.

## 2024-09-14 ENCOUNTER — Telehealth: Payer: Self-pay

## 2024-09-14 NOTE — Transitions of Care (Post Inpatient/ED Visit) (Signed)
" ° °  09/14/2024  Name: Gabriella Griffin MRN: [id number] DOB: 16-Apr-1991  Today's TOC FU Call Status: Today's TOC FU Call Status:: Unsuccessful Call (1st Attempt) Unsuccessful Call (1st Attempt) Date: 09/14/24  Attempted to reach the patient regarding the most recent Inpatient/ED visit.  Follow Up Plan: Additional outreach attempts will be made to reach the patient to complete the Transitions of Care (Post Inpatient/ED visit) call.   Arvin Seip RN, BSN, CCM [employer], Population Health Case Manager Phone: [phone]  "

## 2024-09-14 NOTE — Telephone Encounter (Signed)
 This encounter was created in error - please disregard.

## 2024-09-17 ENCOUNTER — Telehealth: Payer: Self-pay

## 2024-09-19 ENCOUNTER — Telehealth: Payer: Self-pay

## 2024-09-19 NOTE — Transitions of Care (Post Inpatient/ED Visit) (Signed)
" ° °  09/19/2024  Name: Lataja Newland MRN: [id number] DOB: [birth year]  Today's TOC FU Call Status: Today's TOC FU Call Status:: Unsuccessful Call (3rd Attempt) Unsuccessful Call (3rd Attempt) Date: 09/19/24  Attempted to reach the patient regarding the most recent Inpatient/ED visit.  Follow Up Plan: No further outreach attempts will be made at this time. We have been unable to contact the patient.  Arvin Seip RN, BSN, CCM [employer], Population Health Case Manager Phone: [phone]  "

## 2024-09-24 ENCOUNTER — Ambulatory Visit: Payer: Self-pay | Admitting: Nurse Practitioner
